# Patient Record
Sex: Male | Born: 1985
Health system: Southern US, Community
[De-identification: ages and names within clinical notes are randomized; demographics above are authoritative.]

## PROBLEM LIST (undated history)

## (undated) DIAGNOSIS — F419 Anxiety disorder, unspecified: Secondary | ICD-10-CM

## (undated) DIAGNOSIS — F1910 Other psychoactive substance abuse, uncomplicated: Secondary | ICD-10-CM

## (undated) DIAGNOSIS — R51 Headache: Secondary | ICD-10-CM

## (undated) DIAGNOSIS — F319 Bipolar disorder, unspecified: Secondary | ICD-10-CM

## (undated) DIAGNOSIS — I10 Essential (primary) hypertension: Secondary | ICD-10-CM

---
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
## 2005-01-31 ENCOUNTER — Emergency Department (HOSPITAL_COMMUNITY): Admission: AD | Admit: 2005-01-31 | Discharge: 2005-01-31 | Payer: Self-pay | Admitting: Emergency Medicine

## 2005-02-28 ENCOUNTER — Ambulatory Visit: Payer: Self-pay | Admitting: Psychiatry

## 2005-02-28 ENCOUNTER — Inpatient Hospital Stay (HOSPITAL_COMMUNITY): Admission: RE | Admit: 2005-02-28 | Discharge: 2005-03-03 | Payer: Self-pay | Admitting: Psychiatry

## 2009-04-05 ENCOUNTER — Emergency Department (HOSPITAL_COMMUNITY): Admission: EM | Admit: 2009-04-05 | Discharge: 2009-04-05 | Payer: Self-pay | Admitting: Emergency Medicine

## 2010-04-07 ENCOUNTER — Observation Stay (HOSPITAL_COMMUNITY): Admission: EM | Admit: 2010-04-07 | Discharge: 2010-04-08 | Payer: Self-pay | Admitting: Emergency Medicine

## 2010-04-29 ENCOUNTER — Emergency Department (HOSPITAL_COMMUNITY): Admission: EM | Admit: 2010-04-29 | Discharge: 2010-04-30 | Payer: Self-pay | Admitting: Emergency Medicine

## 2010-11-05 LAB — COMPREHENSIVE METABOLIC PANEL
AST: 24 U/L (ref 0–37)
BUN: 15 mg/dL (ref 6–23)
Chloride: 103 mEq/L (ref 96–112)
Creatinine, Ser: 1.14 mg/dL (ref 0.4–1.5)
GFR calc non Af Amer: >60 mL/min (ref >60)
Glucose, Bld: 138 mg/dL — ABNORMAL HIGH (ref 70–99)
Sodium: 137 mEq/L (ref 135–145)
Total Protein: 7.6 g/dL (ref 6.0–8.3)

## 2010-11-05 LAB — URINALYSIS, ROUTINE W REFLEX MICROSCOPIC
Bilirubin Urine: NEGATIVE
Nitrite: NEGATIVE
Protein, ur: NEGATIVE mg/dL
Specific Gravity, Urine: 1.03 (ref 1.005–1.030)
Urobilinogen, UA: 1 mg/dL (ref 0.0–1.0)

## 2010-11-05 LAB — DIFFERENTIAL
Basophils Absolute: 0.1 10*3/uL (ref 0.0–0.1)
Eosinophils Relative: 2 % (ref 0–5)
Lymphocytes Relative: 19 % (ref 12–46)
Lymphs Abs: 1.9 10*3/uL (ref 0.7–4.0)
Monocytes Absolute: 0.5 10*3/uL (ref 0.1–1.0)
Monocytes Relative: 5 % (ref 3–12)
Neutro Abs: 7.1 10*3/uL (ref 1.7–7.7)
Neutrophils Relative %: 73 % (ref 43–77)

## 2010-11-05 LAB — SALICYLATE LEVEL: Salicylate Lvl: <4 mg/dL (ref 2.8–20.0)

## 2010-11-05 LAB — CBC: WBC: 9.7 10*3/uL (ref 4.0–10.5)

## 2010-11-05 LAB — RAPID URINE DRUG SCREEN, HOSP PERFORMED
Amphetamines: NOT DETECTED
Barbiturates: NOT DETECTED

## 2010-11-05 LAB — ACETAMINOPHEN LEVEL: Acetaminophen (Tylenol), Serum: <10 ug/mL — ABNORMAL LOW (ref 10–30)

## 2010-11-06 LAB — CBC
HCT: 41.7 % (ref 39.0–52.0)
Hemoglobin: 13.9 g/dL (ref 13.0–17.0)
MCHC: 33.3 g/dL (ref 30.0–36.0)
MCV: 87.5 fL (ref 78.0–100.0)
MCV: 89.5 fL (ref 78.0–100.0)
Platelets: 337 10*3/uL (ref 150–400)
Platelets: 379 10*3/uL (ref 150–400)
Platelets: 382 10*3/uL (ref 150–400)
RBC: 4.57 MIL/uL (ref 4.22–5.81)
RBC: 4.66 MIL/uL (ref 4.22–5.81)
RDW: 13.6 % (ref 11.5–15.5)
WBC: 10.5 10*3/uL (ref 4.0–10.5)
WBC: 9.1 10*3/uL (ref 4.0–10.5)

## 2010-11-06 LAB — DIFFERENTIAL
Basophils Absolute: 0 10*3/uL (ref 0.0–0.1)
Basophils Absolute: 0 10*3/uL (ref 0.0–0.1)
Basophils Relative: 0 % (ref 0–1)
Eosinophils Absolute: 0.3 10*3/uL (ref 0.0–0.7)
Eosinophils Relative: 2 % (ref 0–5)
Eosinophils Relative: 3 % (ref 0–5)
Lymphocytes Relative: 20 % (ref 12–46)
Lymphocytes Relative: 22 % (ref 12–46)
Lymphs Abs: 2 10*3/uL (ref 0.7–4.0)
Lymphs Abs: 2.5 10*3/uL (ref 0.7–4.0)
Lymphs Abs: 2.8 10*3/uL (ref 0.7–4.0)
Monocytes Absolute: 1.2 10*3/uL — ABNORMAL HIGH (ref 0.1–1.0)
Monocytes Absolute: 1.4 10*3/uL — ABNORMAL HIGH (ref 0.1–1.0)
Monocytes Relative: 10 % (ref 3–12)
Neutro Abs: 6.3 10*3/uL (ref 1.7–7.7)
Neutro Abs: 8.5 10*3/uL — ABNORMAL HIGH (ref 1.7–7.7)
Neutrophils Relative %: 60 % (ref 43–77)
Neutrophils Relative %: 61 % (ref 43–77)

## 2010-11-06 LAB — RAPID URINE DRUG SCREEN, HOSP PERFORMED
Amphetamines: NOT DETECTED
Benzodiazepines: NOT DETECTED
Cocaine: POSITIVE — AB
Tetrahydrocannabinol: NOT DETECTED

## 2010-11-06 LAB — POCT I-STAT, CHEM 8
BUN: 20 mg/dL (ref 6–23)
Chloride: 108 mEq/L (ref 96–112)
HCT: 42 % (ref 39.0–52.0)
Hemoglobin: 14.3 g/dL (ref 13.0–17.0)
Sodium: 139 mEq/L (ref 135–145)
TCO2: 23 mmol/L (ref 0–100)

## 2010-11-06 LAB — URINALYSIS, ROUTINE W REFLEX MICROSCOPIC: Bilirubin Urine: NEGATIVE

## 2010-11-06 LAB — BASIC METABOLIC PANEL
BUN: 17 mg/dL (ref 6–23)
CO2: 19 mEq/L (ref 19–32)
GFR calc non Af Amer: >60 mL/min (ref >60)
Potassium: 3.6 mEq/L (ref 3.5–5.1)

## 2010-11-06 LAB — LIPASE, BLOOD: Lipase: 47 U/L (ref 11–59)

## 2010-11-29 LAB — COMPREHENSIVE METABOLIC PANEL
ALT: 21 U/L (ref 0–53)
AST: 25 U/L (ref 0–37)
Albumin: 4.4 g/dL (ref 3.5–5.2)
Alkaline Phosphatase: 58 U/L (ref 39–117)
BUN: 17 mg/dL (ref 6–23)
Chloride: 104 mEq/L (ref 96–112)
GFR calc Af Amer: >60 mL/min (ref >60)
Potassium: 3.9 mEq/L (ref 3.5–5.1)
Sodium: 137 mEq/L (ref 135–145)
Total Bilirubin: 0.4 mg/dL (ref 0.3–1.2)
Total Protein: 6.8 g/dL (ref 6.0–8.3)

## 2010-11-29 LAB — URINALYSIS, ROUTINE W REFLEX MICROSCOPIC
Glucose, UA: NEGATIVE mg/dL
Ketones, ur: 15 mg/dL — AB
Protein, ur: 30 mg/dL — AB
Specific Gravity, Urine: 1.029 (ref 1.005–1.030)

## 2010-11-29 LAB — DIFFERENTIAL
Basophils Absolute: 0 10*3/uL (ref 0.0–0.1)
Basophils Relative: 0 % (ref 0–1)
Eosinophils Absolute: 0 10*3/uL (ref 0.0–0.7)
Eosinophils Relative: 0 % (ref 0–5)
Monocytes Absolute: 0.8 10*3/uL (ref 0.1–1.0)
Monocytes Relative: 5 % (ref 3–12)

## 2010-11-29 LAB — CBC
HCT: 42.3 % (ref 39.0–52.0)
Platelets: 281 10*3/uL (ref 150–400)
WBC: 17.1 10*3/uL — ABNORMAL HIGH (ref 4.0–10.5)

## 2011-01-08 NOTE — Discharge Summary (Signed)
NAMEMarland [name]  [name], [name]:  [id number]   MEDICAL RECORD NO.:  [id number]          PATIENT TYPE:  IPS   LOCATION:  0306                          FACILITY:  [facility]   PHYSICIAN:  Jeanice Lim, M.D. DATE OF BIRTH:  1986-08-22   DATE OF ADMISSION:  02/28/2005  DATE OF DISCHARGE:  03/03/2005                                 DISCHARGE SUMMARY   IDENTIFYING DATA:  This is a 25 year old single Caucasian male voluntarily  admitted.  Presenting with a history of alcohol abuse.  Drinking daily,  having blackouts.  Six years worth of heavy drinking.  Using crack cocaine.  Drinking a fifth a day.  Also using marijuana and crack cocaine daily.  Depressed.  Had hit himself in the head with a liquor bottle, reporting some  suicidal thoughts as well as inability to stop using.   MEDICATIONS:  Valtrex.   ALLERGIES:  No known drug allergies.   PHYSICAL EXAMINATION:  Physical and neurologic exam essentially within  normal limits.   LABORATORY DATA:  Routine admission labs within normal limits.   MENTAL STATUS EXAM:  Alert, cooperative.  Poor eye contact.  Casually  dressed.  Speech clear.  Mood depressed and irritable.  Affect mildly  irritable with some inappropriate laughing.  Thought processes positive  paranoia, endorsing positive auditory hallucinations.  Cognitively, grossly  intact.  Judgment and insight fair with poor impulse control.   ADMISSION DIAGNOSES:  AXIS I:  Mood disorder with polysubstance abuse with  depression.  Rule out bipolar disorder.  AXIS II:  Deferred.  AXIS III:  None.  AXIS IV:  Moderate (stressors with educational problems, problems related to  legal system and psychosocial issues).  AXIS V:  35/60.   HOSPITAL COURSE:  The patient was admitted and ordered routine p.r.n.  medications and underwent further monitoring.  Was encouraged to participate  in individual, group and milieu therapy.  The patient was placed on detox  protocol, given  Symmetrel, complaining of withdrawal symptoms.  Reported  some difficulty sleeping but then reported a positive response to crisis  intervention, detox and stabilization.   CONDITION ON DISCHARGE:  Discharged in improved condition with no acute  withdrawal symptoms.  No suicidal or homicidal ideation.  Mood was euthymic.  Affect bright.  Thought processes goal directed.  Thought content negative  for dangerous ideation.  The patient appeared motivated to remain abstinent.  Was given medication education.   DISCHARGE MEDICATIONS:  1.  Depakote 250 mg, 1 q.a.m. and 2 at 8 p.m.  2.  Librium 25 mg, 1 at 8 p.m. for two days and then stop.  3.  Symmetrel 100 mg b.i.d.  4.  Risperdal 0.5 mg, 1-1/2 at 8 p.m.  5.  Lunesta 3 mg, 1 q.h.s. p.r.n. insomnia.  6.  Trazodone 100 mg at 8 p.m.   FOLLOW UP:  The patient is to be discharged to follow up with [facility] in  [place].   DISCHARGE DIAGNOSES:  AXIS I:  Mood disorder with polysubstance abuse with  depression.  Rule out bipolar disorder.  AXIS II:  Deferred.  AXIS III:  None.  AXIS IV:  Moderate (stressors with educational problems, problems related to  legal system and psychosocial issues).  AXIS V:  GAF on discharge 55-60.      Jeanice Lim, M.D.  Electronically Signed     JEM/MEDQ  D:  04/15/2005  T:  04/15/2005  Job:  [id number]

## 2011-01-08 NOTE — H&P (Signed)
NAMEMarland Kitchen  Derrick [name], Derrick [name] NO.:  [id number]   MEDICAL RECORD NO.:  [id number]          PATIENT TYPE:  IPS   LOCATION:  0306                          FACILITY:  [facility]   PHYSICIAN:  Jeanice Lim, M.D. DATE OF BIRTH:  02/19/1986   DATE OF ADMISSION:  02/28/2005  DATE OF DISCHARGE:                         PSYCHIATRIC ADMISSION ASSESSMENT   IDENTIFYING INFORMATION:  This is a 25 year old single white male,  voluntarily admitted on February 28, 2005.   HISTORY OF PRESENT ILLNESS:  The patient presents with a history of alcohol  abuse.  The patient states he has been drinking all kinds of alcohol.  His  last drink was 2 days ago.  He drinks in the morning.  He reports blackouts.  He states he will either drink a fifth of liquor, a 12 pack of beer or liter  of wine daily.  He also has been using crack cocaine for the past 6 years,  smoking marijuana.  He has been drinking for approximately 3 years.  He  feels depressed.  His behavior changes.  He states he gets mean.  He has a  history of hitting himself in the head with a liquor bottle.  He breaks  objects.  He hits walls with his fists.  He states when he plays pool with  his friends when someone loses they have to cut themselves.  He reports  decreased sleep.  He has initial and mid insomnia.  Reports mood swings.  He  has lost 20 pounds and is experiencing paranoid ideation.   PAST PSYCHIATRIC HISTORY:  First admission to [facility], no  history of a suicide attempt.  He was in [facility] 1 month ago.   SOCIAL HISTORY:  He is a 25 year old single white male.  He has no children.  He lives with his grandparents.  He has completed 9th grade.  He states he  is on unsupervised probation.  He has had legal problems with breaking and  entering, misdemeanor with marijuana and states he is currently in a  relationship with a 26 year old male.   FAMILY HISTORY:  Grandfather with alcohol issues.   ALCOHOL DRUG  HISTORY:  The patient smokes, drinking habits are described as  above, as well as his drug habits.   PAST MEDICAL HISTORY:  Primary care Lauriann Milillo:  The patient reports none.  Medical problems are none.   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   DRUG ALLERGIES:  No known allergies.   PHYSICAL EXAMINATION:  Physical examination was performed.  Review of  systems:  The patient denies any chest pain, shortness of breath, reports a  20 pound weight loss, problems with insomnia and mood swings.  Strong  history of alcohol and drug use.  Temperature is 98.1, 98 heart rate, 18  respirations, blood pressure is 136/78.  Six feet tall, 174 pounds.  He is a  well-nourished, well-developed male, no acute distress.  Negative  lymphadenopathy.  CHEST:  Clear.  HEART:  Regular rate and rhythm.  ABDOMEN:  Soft and flat abdomen, nontender.  EXTREMITIES:  The patient moves  all extremities, no clubbing or edema.  SKIN:  Is warm and dry.  NEUROLOGICAL FINDINGS:  Intact and nonfocal.   LABORATORY DATA:  CBC is within normal limits.  CMET is within normal  limits.  Alcohol level less than 5.  Urine drug screen is pending.   MENTAL STATUS EXAM:  He is an alert, cooperative male, poor eye contact.  He  is casually dressed.  Speech is clear.  The patient feels depressed and  irritable.  The patient also is expressing some mild irritability and  inappropriate laughing.  Thought processes positive paranoia, endorsing  positive auditory hallucinations with his substances.  Cognitive function  intact.  Memory is good.  Judgment and insight is fair, poor impulse  control.   ADMISSION DIAGNOSES:  AXIS I:  Mood disorder, polysubstance abuse, rule out  dependence, rule out bipolar disorder with substance abuse.  AXIS II:  Deferred.  AXIS III:  None.  AXIS IV:  Problems with education, legal system and crime, other  psychosocial problems.  AXIS V:  Current is 35, past year 64.   PLAN:   Plan is to detox patient, work on relapse prevention.  We will  monitor withdrawal symptoms, push fluids.  We will have Depakote and  Risperdal for mood stabilization and psychotic symptoms, have Symmetrel for  drug cravings.  Case manager to look at any potential rehab programs.  The  patient is to follow up with AA and NA meetings.   TENTATIVE LENGTH OF CARE:  4-5 days.       JO/MEDQ  D:  03/02/2005  T:  03/02/2005  Job:  [id number]

## 2011-04-19 ENCOUNTER — Ambulatory Visit (INDEPENDENT_AMBULATORY_CARE_PROVIDER_SITE_OTHER): Payer: Self-pay

## 2011-04-19 ENCOUNTER — Inpatient Hospital Stay (INDEPENDENT_AMBULATORY_CARE_PROVIDER_SITE_OTHER)
Admission: RE | Admit: 2011-04-19 | Discharge: 2011-04-19 | Disposition: A | Discharge status: Home / Self Care | Payer: Self-pay | Source: Ambulatory Visit | Attending: Family Medicine | Admitting: Family Medicine

## 2011-04-19 DIAGNOSIS — R1032 Left lower quadrant pain: Secondary | ICD-10-CM

## 2011-04-19 LAB — POCT URINALYSIS DIP (DEVICE)
Hgb urine dipstick: NEGATIVE
Leukocytes, UA: NEGATIVE
Nitrite: NEGATIVE
Protein, ur: NEGATIVE mg/dL
pH: 5.5 (ref 5.0–8.0)

## 2013-01-03 ENCOUNTER — Emergency Department (HOSPITAL_COMMUNITY)
Admission: EM | Admit: 2013-01-03 | Discharge: 2013-01-03 | Disposition: A | Discharge status: Other Acute Inpatient Hospital | Payer: Self-pay | Attending: Emergency Medicine | Admitting: Emergency Medicine

## 2013-01-03 ENCOUNTER — Encounter (HOSPITAL_COMMUNITY): Payer: Self-pay | Admitting: Emergency Medicine

## 2013-01-03 ENCOUNTER — Emergency Department (HOSPITAL_COMMUNITY): Payer: Self-pay

## 2013-01-03 ENCOUNTER — Inpatient Hospital Stay (HOSPITAL_COMMUNITY)
Admission: AD | Admit: 2013-01-03 | Discharge: 2013-01-08 | DRG: 885 | Disposition: A | Discharge status: Home / Self Care | Payer: Federal, State, Local not specified - Other | Source: Intra-hospital | Attending: Psychiatry | Admitting: Psychiatry

## 2013-01-03 ENCOUNTER — Encounter (HOSPITAL_COMMUNITY): Payer: Self-pay | Admitting: *Deleted

## 2013-01-03 DIAGNOSIS — Z79899 Other long term (current) drug therapy: Secondary | ICD-10-CM

## 2013-01-03 DIAGNOSIS — Z7289 Other problems related to lifestyle: Secondary | ICD-10-CM

## 2013-01-03 DIAGNOSIS — F639 Impulse disorder, unspecified: Secondary | ICD-10-CM | POA: Diagnosis present

## 2013-01-03 DIAGNOSIS — Z0289 Encounter for other administrative examinations: Secondary | ICD-10-CM | POA: Insufficient documentation

## 2013-01-03 DIAGNOSIS — F489 Nonpsychotic mental disorder, unspecified: Secondary | ICD-10-CM

## 2013-01-03 DIAGNOSIS — F319 Bipolar disorder, unspecified: Secondary | ICD-10-CM | POA: Insufficient documentation

## 2013-01-03 DIAGNOSIS — R45851 Suicidal ideations: Secondary | ICD-10-CM

## 2013-01-03 DIAGNOSIS — F411 Generalized anxiety disorder: Secondary | ICD-10-CM | POA: Insufficient documentation

## 2013-01-03 DIAGNOSIS — S1093XA Contusion of unspecified part of neck, initial encounter: Secondary | ICD-10-CM | POA: Insufficient documentation

## 2013-01-03 DIAGNOSIS — F419 Anxiety disorder, unspecified: Secondary | ICD-10-CM

## 2013-01-03 DIAGNOSIS — S0083XA Contusion of other part of head, initial encounter: Secondary | ICD-10-CM

## 2013-01-03 DIAGNOSIS — S0003XA Contusion of scalp, initial encounter: Secondary | ICD-10-CM | POA: Insufficient documentation

## 2013-01-03 DIAGNOSIS — F39 Unspecified mood [affective] disorder: Principal | ICD-10-CM | POA: Diagnosis present

## 2013-01-03 DIAGNOSIS — X838XXA Intentional self-harm by other specified means, initial encounter: Secondary | ICD-10-CM | POA: Insufficient documentation

## 2013-01-03 DIAGNOSIS — F1410 Cocaine abuse, uncomplicated: Secondary | ICD-10-CM | POA: Insufficient documentation

## 2013-01-03 DIAGNOSIS — H1130 Conjunctival hemorrhage, unspecified eye: Secondary | ICD-10-CM | POA: Insufficient documentation

## 2013-01-03 DIAGNOSIS — S0010XA Contusion of unspecified eyelid and periocular area, initial encounter: Secondary | ICD-10-CM | POA: Insufficient documentation

## 2013-01-03 HISTORY — DX: Bipolar disorder, unspecified: F31.9

## 2013-01-03 LAB — COMPREHENSIVE METABOLIC PANEL
AST: 13 U/L (ref 0–37)
BUN: 17 mg/dL (ref 6–23)
CO2: 25 mEq/L (ref 19–32)
Calcium: 9.6 mg/dL (ref 8.4–10.5)
Chloride: 102 mEq/L (ref 96–112)
Creatinine, Ser: 0.99 mg/dL (ref 0.50–1.35)
GFR calc non Af Amer: >90 mL/min (ref >90)
Total Bilirubin: 0.3 mg/dL (ref 0.3–1.2)

## 2013-01-03 LAB — RAPID URINE DRUG SCREEN, HOSP PERFORMED
Cocaine: POSITIVE — AB
Opiates: NOT DETECTED
Tetrahydrocannabinol: POSITIVE — AB

## 2013-01-03 LAB — ETHANOL: Alcohol, Ethyl (B): <11 mg/dL (ref 0–11)

## 2013-01-03 LAB — CBC
HCT: 42.8 % (ref 39.0–52.0)
MCH: 31.4 pg (ref 26.0–34.0)
MCV: 86.6 fL (ref 78.0–100.0)
Platelets: 344 10*3/uL (ref 150–400)
RBC: 4.94 MIL/uL (ref 4.22–5.81)
RDW: 12.7 % (ref 11.5–15.5)

## 2013-01-03 MED ORDER — HYDROXYZINE HCL 25 MG PO TABS: 25.0000 mg | ORAL_TABLET | ORAL | Status: DC | PRN

## 2013-01-03 MED ORDER — IBUPROFEN 600 MG PO TABS: 600.0000 mg | ORAL_TABLET | Freq: Three times a day (TID) | ORAL | Status: DC | PRN

## 2013-01-03 MED ORDER — HALOPERIDOL LACTATE 5 MG/ML IJ SOLN: 5.0000 mg | Freq: Four times a day (QID) | INTRAMUSCULAR | Status: DC | PRN

## 2013-01-03 MED ORDER — NICOTINE 21 MG/24HR TD PT24
21.0000 mg | MEDICATED_PATCH | Freq: Every day | TRANSDERMAL | Status: DC
  Administered 2013-01-04 – 2013-01-08 (×5): 21 mg via TRANSDERMAL
  Filled 2013-01-03 (×8): qty 1

## 2013-01-03 MED ORDER — ZOLPIDEM TARTRATE 5 MG PO TABS: 5.0000 mg | ORAL_TABLET | Freq: Every evening | ORAL | Status: DC | PRN

## 2013-01-03 MED ORDER — MAGNESIUM HYDROXIDE 400 MG/5ML PO SUSP: 30.0000 mL | Freq: Every day | ORAL | Status: DC | PRN

## 2013-01-03 MED ORDER — DIVALPROEX SODIUM ER 500 MG PO TB24
500.0000 mg | ORAL_TABLET | Freq: Two times a day (BID) | ORAL | Status: DC
  Administered 2013-01-03: 500 mg via ORAL
  Filled 2013-01-03: qty 1

## 2013-01-03 MED ORDER — ALUM & MAG HYDROXIDE-SIMETH 200-200-20 MG/5ML PO SUSP: 30.0000 mL | ORAL | Status: DC | PRN

## 2013-01-03 MED ORDER — FLUOXETINE HCL 20 MG PO TABS
20.0000 mg | ORAL_TABLET | Freq: Every day | ORAL | Status: DC
  Administered 2013-01-04 – 2013-01-05 (×2): 20 mg via ORAL
  Filled 2013-01-03 (×6): qty 1

## 2013-01-03 MED ORDER — ACETAMINOPHEN 325 MG PO TABS: 650.0000 mg | ORAL_TABLET | Freq: Four times a day (QID) | ORAL | Status: DC | PRN

## 2013-01-03 MED ORDER — HALOPERIDOL 5 MG PO TABS: 5.0000 mg | ORAL_TABLET | Freq: Four times a day (QID) | ORAL | Status: DC | PRN

## 2013-01-03 MED ORDER — LORAZEPAM 1 MG PO TABS: 1.0000 mg | ORAL_TABLET | Freq: Three times a day (TID) | ORAL | Status: DC | PRN

## 2013-01-03 MED ORDER — NICOTINE 21 MG/24HR TD PT24
21.0000 mg | MEDICATED_PATCH | Freq: Every day | TRANSDERMAL | Status: DC
  Administered 2013-01-03: 21 mg via TRANSDERMAL
  Filled 2013-01-03: qty 1

## 2013-01-03 MED ORDER — ACETAMINOPHEN 325 MG PO TABS: 650.0000 mg | ORAL_TABLET | ORAL | Status: DC | PRN

## 2013-01-03 MED ORDER — ONDANSETRON HCL 4 MG PO TABS: 4.0000 mg | ORAL_TABLET | Freq: Three times a day (TID) | ORAL | Status: DC | PRN

## 2013-01-03 MED ORDER — ALUM & MAG HYDROXIDE-SIMETH 200-200-20 MG/5ML PO SUSP
30.0000 mL | ORAL | Status: DC | PRN
  Administered 2013-01-05: 30 mL via ORAL

## 2013-01-03 NOTE — Consult Note (Signed)
Reason for Consult: Substance induced mood disorder, bipolar disorder by history, cocaine intoxication and cannabis abuse Referring Physician: Dr. Francee Gentile is an 27 y.o. male.  HPI: Patient was seen and chart reviewed. Patient stated that he was referred to the psychiatric evaluation by his probation officer because self-injurious behavior suicidal thoughts and depression and anxiety. Patient cannot contract for safety. Patient reports he has been under a lot of stress which includes taking care of his mom who is very sick in the process of dying in the hospital. His girlfriend recently gave birth to his child however refuse to allow him to see his child because he does not get along with her fianc's mother/Bansal. He is having anxiety, panic attack and having persistent thoughts of suicidal ideation including overdose, or hanging himself but too afraid to do it. He admits to using crack last night. He denies any homicidal ideation or hallucination. He reports whenever he gets to stress how he usually would self injured by punching himself in the face also smashing his head against the wall, or using a phone to hit his forehead. He injured himself last night by punching himself in the face. He is here requesting for psychiatric help. He has been taking Prozac which he has for many years, no doses change. Patient is a smoker and smoked one and half pack a day. Urine drug screen positive for cocaine and tetrahydrocannabinol.  Mental Status Examination: Patient has a mild will self-injurious lacerations on his forehead and a black eye. He is calm, cooperative and maintained fair contact. Patient has good mood and his affect was constricted. He has normal rate, rhythm, and volume of speech. His thought process is linear and goal directed. Patient has suicidal ideations and vague plans but denies homicidal ideations, intentions or plans. Patient has no evidence of auditory or visual hallucinations,  delusions, and paranoia. Patient has poor insight judgment and impulse control.  Past Medical History  Diagnosis Date  . Bipolar 1 disorder     History reviewed. No pertinent past surgical history.  History reviewed. No pertinent family history.  Social History:  reports that he has been smoking Cigarettes.  He has been smoking about 1.00 pack per day. He does not have any smokeless tobacco history on file. He reports that  drinks alcohol. He reports that he uses illicit drugs (Marijuana and "Crack" cocaine).  Allergies: No Known Allergies  Medications: I have reviewed the patient's current medications.  Results for orders placed during the hospital encounter of 01/03/13 (from the past 48 hour(s))  CBC     Status: Abnormal   Collection Time    01/03/13  9:30 AM      Result Value Range   WBC 8.6  4.0 - 10.5 K/uL   RBC 4.94  4.22 - 5.81 MIL/uL   Hemoglobin 15.5  13.0 - 17.0 g/dL   HCT 16.1  09.6 - 04.5 %   MCV 86.6  78.0 - 100.0 fL   MCH 31.4  26.0 - 34.0 pg   MCHC 36.2 (*) 30.0 - 36.0 g/dL   RDW 40.9  81.1 - 91.4 %   Platelets 344  150 - 400 K/uL  COMPREHENSIVE METABOLIC PANEL     Status: Abnormal   Collection Time    01/03/13  9:30 AM      Result Value Range   Sodium 138  135 - 145 mEq/L   Potassium 3.5  3.5 - 5.1 mEq/L   Chloride 102  96 - 112 mEq/L   CO2 25  19 - 32 mEq/L   Glucose, Bld 107 (*) 70 - 99 mg/dL   BUN 17  6 - 23 mg/dL   Creatinine, Ser 4.09  0.50 - 1.35 mg/dL   Calcium 9.6  8.4 - 81.1 mg/dL   Total Protein 7.2  6.0 - 8.3 g/dL   Albumin 4.0  3.5 - 5.2 g/dL   AST 13  0 - 37 U/L   ALT 9  0 - 53 U/L   Alkaline Phosphatase 66  39 - 117 U/L   Total Bilirubin 0.3  0.3 - 1.2 mg/dL   GFR calc non Af Amer >90  >90 mL/min   GFR calc Af Amer >90  >90 mL/min   Comment:            The eGFR has been calculated     using the CKD EPI equation.     This calculation has not been     validated in all clinical     situations.     eGFR's persistently     <90 mL/min  signify     possible Chronic Kidney Disease.  ETHANOL     Status: None   Collection Time    01/03/13  9:30 AM      Result Value Range   Alcohol, Ethyl (B) <11  0 - 11 mg/dL   Comment:            LOWEST DETECTABLE LIMIT FOR     SERUM ALCOHOL IS 11 mg/dL     FOR MEDICAL PURPOSES ONLY  URINE RAPID DRUG SCREEN (HOSP PERFORMED)     Status: Abnormal   Collection Time    01/03/13  9:45 AM      Result Value Range   Opiates NONE DETECTED  NONE DETECTED   Cocaine POSITIVE (*) NONE DETECTED   Benzodiazepines NONE DETECTED  NONE DETECTED   Amphetamines NONE DETECTED  NONE DETECTED   Tetrahydrocannabinol POSITIVE (*) NONE DETECTED   Barbiturates NONE DETECTED  NONE DETECTED   Comment:            DRUG SCREEN FOR MEDICAL PURPOSES     ONLY.  IF CONFIRMATION IS NEEDED     FOR ANY PURPOSE, NOTIFY LAB     WITHIN 5 DAYS.                LOWEST DETECTABLE LIMITS     FOR URINE DRUG SCREEN     Drug Class       Cutoff (ng/mL)     Amphetamine      1000     Barbiturate      200     Benzodiazepine   200     Tricyclics       300     Opiates          300     Cocaine          300     THC              50    Ct Maxillofacial Wo Cm  01/03/2013   *RADIOLOGY REPORT*  Clinical Data: Soft injury.  Rule out orbital fracture.  Bruising about right eye.  CT MAXILLOFACIAL WITHOUT CONTRAST  Technique:  Multidetector CT imaging of the maxillofacial structures was performed. Multiplanar CT image reconstructions were also generated.  Comparison: None.  Findings: Soft tissue windows demonstrate right periorbital soft tissue swelling.  Normal appearance of the right  orbit and globe.  Bone windows demonstrate mucosal thickening of bilateral maxillary sinuses.  Suspicion of remote nasal bone fractures, nondisplaced to minimally displaced.  No overlying soft tissue swelling to suggest acuity.  Ethmoid air cell mucosal thickening.  No fluid in the mastoid air cells or paranasal sinuses.  Coronal reformats demonstrate intact  orbital floors.  Dental caries involving bilateral maxillary teeth, including on image 27/series 4.  IMPRESSION:  1.  Soft tissue swelling about the right orbit, without underlying fracture. 2.  Irregularity of bilateral nasal bones, likely related to remote trauma. 3.  Chronic sinusitis. 4.  Dental caries.   Original Report Authenticated By: Jeronimo Greaves, M.D.    Positive for anxiety, depression, illegal drug usage, sleep disturbance and Legal charges for breaking and entering and larsany, history of aggressive  behaviors Blood pressure 146/90, pulse 70, temperature 98.3 F (36.8 C), temperature source Oral, resp. rate 14, height 6' (1.829 m), weight 180 lb (81.647 kg), SpO2 100.00%.   Assessment/Plan: Cocaine intoxication Cannabis abuse Substance induced mood disorder Rule out bipolar disorder  Recommendation: Recommended acute psychiatric hospitalization for crisis stabilization and to obtain additional information from the probation officer and family members. No medication recommended at this time.  Belem Hintze,JANARDHAHA R. 01/03/2013, 12:39 PM

## 2013-01-03 NOTE — ED Provider Notes (Signed)
History     CSN: [id number]  Arrival date & time 01/03/13  0911   First MD Initiated Contact with Patient 01/03/13 [phone]      Chief Complaint  Patient presents with  . Medical Clearance    (Consider location/radiation/quality/duration/timing/severity/associated sxs/prior treatment) HPI  27 year old male with history of bipolar presents for evaluations of suicidal ideation. Patient reports he has been under a lot of stress lately. Stress include taking care of  his mom who is very sick in the process of dying in the hospital. His girlfriend recently gave birth to his child however refuse to allow him to see his child. He is having anxiety, panic attack and having persistent thoughts of suicidal ideation including overdose, or hanging himself but too afraid to do it. He admits to using crack last night. He denies any recent alcohol use. He denies any homicidal ideation or hallucination. He reports whenever he gets to stress how he usually would self injured by punching himself in the face also smashing his head against the wall, or using a phone to hit his forehead. He injured himself last night by punching himself in the face.  He is here requesting for psychiatric help. He has been taking Prozac which he has for many years, no doses change. Patient is a smoker and smoked one and half pack a day.  Past Medical History  Diagnosis Date  . Bipolar 1 disorder     History reviewed. No pertinent past surgical history.  History reviewed. No pertinent family history.  History  Substance Use Topics  . Smoking status: Current Every Day Smoker -- 1.00 packs/day    Types: Cigarettes  . Smokeless tobacco: Not on file  . Alcohol Use: No      Review of Systems  Constitutional:       A complete 10 system review of systems was obtained and all systems are negative except as noted in the HPI and PMH.    Allergies  Review of patient's allergies indicates no known allergies.  Home Medications   No current outpatient prescriptions on file.  There were no vitals taken for this visit.  Physical Exam  Nursing note and vitals reviewed. Constitutional: He appears well-developed and well-nourished. No distress.  Awake, alert, nontoxic appearance  HENT:  Head: Normocephalic.    Nose: no septal hematoma  Mouth: no malocclusion, or trismus  Eyes: EOM are normal. Pupils are equal, round, and reactive to light. Right eye exhibits no discharge. Left eye exhibits no discharge. Right conjunctiva is not injected. Right conjunctiva has a hemorrhage. Left conjunctiva is not injected. Left conjunctiva has no hemorrhage.  Slit lamp exam:      The right eye shows no hyphema.       The left eye shows no hyphema.  Subconjunctival hemorrhage to R eye.  No chemosis.  EOMI, PERRL  Racoon eyes R>L.  Neck: Normal range of motion. Neck supple.  Cardiovascular: Normal rate and regular rhythm.   Pulmonary/Chest: Effort normal. No respiratory distress. He exhibits no tenderness.  Abdominal: Soft. There is no tenderness. There is no rebound.  Musculoskeletal: He exhibits no tenderness.  ROM appears intact, no obvious focal weakness  Neurological: He is alert.  Skin: Skin is warm and dry. No rash noted.  Psychiatric: He has a normal mood and affect.    ED Course  Procedures (including critical care time)  9:37 AM Pt here for evaluation and management of SI.  Also has self injury habit, with evidence of  ecchymosis, and Racoon eyes to both eyes.  Will obtain maxillofacial, will perform medical clearance.  Pt is cooperative.    11:01 AM CT of maxillofacial shows evidence of soft tissue swelling to the right orbit however without underlying fractures. There is irregularity of the bilateral nasal bones likely related to remote trauma.   The patient is medically cleared, we'll move the psych,will consult ACT.  Labs Reviewed  CBC - Abnormal; Notable for the following:    MCHC 36.2 (*)    All other  components within normal limits  COMPREHENSIVE METABOLIC PANEL - Abnormal; Notable for the following:    Glucose, Bld 107 (*)    All other components within normal limits  URINE RAPID DRUG SCREEN (HOSP PERFORMED) - Abnormal; Notable for the following:    Cocaine POSITIVE (*)    Tetrahydrocannabinol POSITIVE (*)    All other components within normal limits  ETHANOL   Ct Maxillofacial Wo Cm  01/03/2013   *RADIOLOGY REPORT*  Clinical Data: Soft injury.  Rule out orbital fracture.  Bruising about right eye.  CT MAXILLOFACIAL WITHOUT CONTRAST  Technique:  Multidetector CT imaging of the maxillofacial structures was performed. Multiplanar CT image reconstructions were also generated.  Comparison: None.  Findings: Soft tissue windows demonstrate right periorbital soft tissue swelling.  Normal appearance of the right orbit and globe.  Bone windows demonstrate mucosal thickening of bilateral maxillary sinuses.  Suspicion of remote nasal bone fractures, nondisplaced to minimally displaced.  No overlying soft tissue swelling to suggest acuity.  Ethmoid air cell mucosal thickening.  No fluid in the mastoid air cells or paranasal sinuses.  Coronal reformats demonstrate intact orbital floors.  Dental caries involving bilateral maxillary teeth, including on image 27/series 4.  IMPRESSION:  1.  Soft tissue swelling about the right orbit, without underlying fracture. 2.  Irregularity of bilateral nasal bones, likely related to remote trauma. 3.  Chronic sinusitis. 4.  Dental caries.   Original Report Authenticated By: Jeronimo Greaves, M.D.     1. Self-injurious behavior   2. Suicidal ideation   3. Anxiety   4. Traumatic ecchymosis of face, initial encounter       MDM  BP 149/90  Pulse 79  Temp(Src) 98.4 F (36.9 C) (Oral)  Resp 16  Ht 6' (1.829 m)  Wt 180 lb (81.647 kg)  BMI 24.41 kg/m2  SpO2 98%  I have reviewed nursing notes and vital signs. I personally reviewed the imaging tests through PACS system   I reviewed available ER/hospitalization records thought the EMR         Fayrene Helper, [place] 01/03/13 1107

## 2013-01-03 NOTE — ED Notes (Addendum)
Pt states that he is having SI.  Went to [employer] on Monday.  States that "they didn't do anything for him".  Pt has a large black eye to his right eye.  States that he punched himself in the face.  States that when he has anxiety, he beats himself over the head with a phone.  Denies HI.  Plan is to hang himself or overdose.  Pt is on probation and has a ankle bracelet that needs to be charged for 2 hours each day.

## 2013-01-03 NOTE — Tx Team (Signed)
Initial Interdisciplinary Treatment Plan  PATIENT STRENGTHS: (choose at least two) Average or above average intelligence Communication skills General fund of knowledge Motivation for treatment/growth Supportive family/friends  PATIENT STRESSORS: Financial difficulties Legal issue Marital or family conflict Occupational concerns Substance abuse   PROBLEM LIST: Problem List/Patient Goals Date to be addressed Date deferred Reason deferred Estimated date of resolution  Substance abuse 01-03-13     Depression 01-03-13     Mood stabilization 01-03-13     Family conflict 01-03-13     Legal problems 01-03-13     Suicidal ideation 01-03-13                        DISCHARGE CRITERIA:  Ability to meet basic life and health needs Improved stabilization in mood, thinking, and/or behavior Motivation to continue treatment in a less acute level of care Need for constant or close observation no longer present Withdrawal symptoms are absent or subacute and managed without 24-hour nursing intervention  PRELIMINARY DISCHARGE PLAN: Attend aftercare/continuing care group Attend 12-step recovery group Return to previous living arrangement  PATIENT/FAMIILY INVOLVEMENT: This treatment plan has been presented to and reviewed with the patient, Derrick [name].  The patient and family have been given the opportunity to ask questions and make suggestions.  Cranford Mon 01/03/2013, 6:31 PM

## 2013-01-03 NOTE — ED Provider Notes (Signed)
Medical screening examination/treatment/procedure(s) were performed by non-physician practitioner and as supervising physician I was immediately available for consultation/collaboration. Devoria Albe, MD, Armando Gang   Ward Givens, MD 01/03/13 [phone]

## 2013-01-03 NOTE — ED Notes (Signed)
Report called to Sheila, RN.
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
## 2013-01-03 NOTE — Progress Notes (Signed)
Patient ID: Derrick [name], male   DOB: Jul 22, 1986, 27 y.o.   MRN: [id number] Derrick [name] is a 27 yo male w/history of bipolar disorder admitted for suicidal ideation and substance abuse.  Patient has a hx of self mutilation in which he cuts and hit himself.  Patient has a black right eye due to him hitting himself.  He also has multiple superficial cuts over his chest area.  He reports that he last cut on 01/02/13.  Patient had a plan to overdose, hang self, or sit in a garage with the car running.  He reports he remains suicidal, however, can contract for safety with staff.  Patient reports being under a lot of stress lately.  He takes care of his elderly mother who has alzheimers.  His fiance just had their baby and refuses to let him see the baby.  Patient had made an attempt to see the child yesterday and states he was served at [employer].  He is also on house arrest for possession of stolen goods and larceny.  He is currently wearing an ankle bracelet on right leg.  Patient report daily use of crack cocaine.  His last use was 01/02/13.  He denies any alcohol use.  His COWS was a 5.  Patient also reports that he has severe panic attacks in which he blacks out and cannot take vistaril for them.  He reports that vistaril makes "me violent."  Patient denies any HI or AVH.  He was oriented to unit and given nutrition and fluids.

## 2013-01-03 NOTE — Progress Notes (Signed)
Patient accepted to [facility] [address] to Dr. Jannifer Franklin. RN and EDP aware. Patient voluntary and will be transferred by security. CSW completed support paperwork.   Catha Gosselin, LCSWA  [phone] .01/03/2013 1546pm

## 2013-01-04 ENCOUNTER — Encounter (HOSPITAL_COMMUNITY): Payer: Self-pay | Admitting: Psychiatry

## 2013-01-04 DIAGNOSIS — F639 Impulse disorder, unspecified: Secondary | ICD-10-CM | POA: Diagnosis present

## 2013-01-04 DIAGNOSIS — F1920 Other psychoactive substance dependence, uncomplicated: Secondary | ICD-10-CM

## 2013-01-04 DIAGNOSIS — F39 Unspecified mood [affective] disorder: Principal | ICD-10-CM | POA: Diagnosis present

## 2013-01-04 MED ORDER — CHLORDIAZEPOXIDE HCL 25 MG PO CAPS
25.0000 mg | ORAL_CAPSULE | Freq: Three times a day (TID) | ORAL | Status: DC | PRN
  Administered 2013-01-04 – 2013-01-08 (×9): 25 mg via ORAL
  Filled 2013-01-04 (×9): qty 1

## 2013-01-04 MED ORDER — GABAPENTIN 100 MG PO CAPS
100.0000 mg | ORAL_CAPSULE | Freq: Three times a day (TID) | ORAL | Status: DC
  Administered 2013-01-04 – 2013-01-05 (×4): 100 mg via ORAL
  Filled 2013-01-04 (×6): qty 1

## 2013-01-04 MED ORDER — CARBAMAZEPINE ER 200 MG PO CP12
200.0000 mg | ORAL_CAPSULE | Freq: Two times a day (BID) | ORAL | Status: DC
  Administered 2013-01-04 – 2013-01-08 (×8): 200 mg via ORAL
  Filled 2013-01-04 (×4): qty 1
  Filled 2013-01-04: qty 56
  Filled 2013-01-04 (×4): qty 1
  Filled 2013-01-04: qty 56
  Filled 2013-01-04 (×2): qty 1

## 2013-01-04 MED ORDER — DIPHENHYDRAMINE HCL 25 MG PO CAPS: 50.0000 mg | ORAL_CAPSULE | Freq: Every evening | ORAL | Status: DC | PRN

## 2013-01-04 MED ORDER — IBUPROFEN 200 MG PO TABS
400.0000 mg | ORAL_TABLET | ORAL | Status: DC | PRN
  Administered 2013-01-04 – 2013-01-07 (×3): 400 mg via ORAL
  Filled 2013-01-04 (×3): qty 2

## 2013-01-04 NOTE — Progress Notes (Signed)
Patient ID: Derrick [name], male   DOB: [birth year], 26 y.o.   MRN: [id number]   D: Pt appeared extremely anxious and in fact informed the nurse of this fact. Stated he just wants to be "normal".  Pt stated his temper "flips like a switch" and that he has a "mean nature". Also states he has panic attacks, but can't and won't take vistaril, topamax, or remeron. Pt stated that his mother used to give him neurontin, "it helped and librium helps".   A:  Support and encouragement was offered. 15 min checks continued for safety.  R: Pt remains safe.

## 2013-01-04 NOTE — Progress Notes (Signed)
Recreation Therapy Notes  Date: 05.15.2014 Time: 3:00pm Location: [address] Dayroom      Group Topic/Focus: Leisure Education  Participation Level: Did not attend  Bonni Neuser L Micah Galeno, LRT/CTRS  Ciarah Peace L 01/04/2013 4:25 PM 
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
## 2013-01-04 NOTE — Progress Notes (Signed)
Patient did attend the evening karaoke group.  

## 2013-01-04 NOTE — [facility] Group Notes (Signed)
[facility] LCSW Aftercare Discharge Planning Group Note   01/04/2013 8:45 AM  Participation Quality:  Did Not Attend      Derrick [name]
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
## 2013-01-04 NOTE — [facility] Suicide Risk Assessment (Signed)
Suicide Risk Assessment  Admission Assessment     Nursing information obtained from:  Patient Demographic factors:  Male;Caucasian;Low socioeconomic status;Unemployed Current Mental Status:  Suicidal ideation indicated by patient;Suicide plan;Plan includes specific time, place, or method;Self-harm behaviors;Belief that plan would result in death Loss Factors:  Loss of significant relationship;Legal issues;Financial problems / change in socioeconomic status Historical Factors:  Impulsivity Risk Reduction Factors:  Responsible for children under 53 years of age;Living with another person, especially a relative  CLINICAL FACTORS:   Severe Anxiety and/or Agitation Bipolar Disorder:   Mixed State Alcohol/Substance Abuse/Dependencies  COGNITIVE FEATURES THAT CONTRIBUTE TO RISK:  Closed-mindedness Polarized thinking Thought constriction (tunnel vision)    SUICIDE RISK:   Moderate:  Frequent suicidal ideation with limited intensity, and duration, some specificity in terms of plans, no associated intent, good self-control, limited dysphoria/symptomatology, some risk factors present, and identifiable protective factors, including available and accessible social support.  PLAN OF CARE: Supportive approach/coping skills/relapse prevention                               Optimize treatment with medications: Equetro/Neurontin/Prozac  I certify that inpatient services furnished can reasonably be expected to improve the patient's condition.  Anthony Tamburo A 01/04/2013, 6:24 PM

## 2013-01-04 NOTE — Progress Notes (Signed)
Patient ID: Derrick [name], male   DOB: 1986-03-23, 27 y.o.   MRN: [id number] He has been up and to parts of the groups interacting with peers and staff. Has c/o headache and received medication for pain. Self inventory: depression 9, hopelessness 10, withdrawals of agitation, SI on and off contracts for safety.

## 2013-01-05 DIAGNOSIS — F639 Impulse disorder, unspecified: Secondary | ICD-10-CM

## 2013-01-05 DIAGNOSIS — F39 Unspecified mood [affective] disorder: Principal | ICD-10-CM

## 2013-01-05 DIAGNOSIS — F4310 Post-traumatic stress disorder, unspecified: Secondary | ICD-10-CM

## 2013-01-05 MED ORDER — GABAPENTIN 100 MG PO CAPS
200.0000 mg | ORAL_CAPSULE | Freq: Three times a day (TID) | ORAL | Status: DC
  Administered 2013-01-05 – 2013-01-06 (×3): 200 mg via ORAL
  Filled 2013-01-05 (×8): qty 2

## 2013-01-05 MED ORDER — FLUOXETINE HCL 20 MG PO CAPS
20.0000 mg | ORAL_CAPSULE | Freq: Every day | ORAL | Status: DC
  Administered 2013-01-06 – 2013-01-08 (×3): 20 mg via ORAL
  Filled 2013-01-05 (×2): qty 1
  Filled 2013-01-05: qty 14
  Filled 2013-01-05 (×2): qty 1

## 2013-01-05 NOTE — Progress Notes (Signed)
D patient slept fair lasst nite, appetite is improving, en ergy level is low and ability to pay attention is poor, depressed 8/10 and hopeless 8/10, WD s/s includes agitation and headaches, states he is SI off and on but contracts for safety, taking meds as ordered by MD, is pleasant and easy to talk with, no complaints voiced this morning. Attending meals in the DR and attending group. A q84min safety checks continue and support offered, encouraged to continue attending group and to participate. R patient remains safe on the unit

## 2013-01-05 NOTE — [facility] Group Notes (Signed)
[facility] LCSW Group Therapy  01/05/2013 1:15 PM  Type of Therapy:  Group Therapy  Participation Level:  Active  Participation Quality:  Monopolizing  Affect:  Anxious and Excited  Cognitive:  Alert and Oriented  Insight:  Limited  Engagement in Therapy:  Developing yet limited  Modes of Intervention:  Clarification, Education, Reality Testing and Support  Summary of Progress/Problems: Patient's were invited to share feelings about relapse and group session also included an educational portion on Post Acute Withdrawal Syndrome (PAWS).  Derrick [name] shared how he uses substances to deal with his constant paranoia and fear which in effect heightens his paranoia and fear.  Derrick [name] also shared about how he smokes crack with entire family including dying mother, his father and also his brother.    [name]
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
## 2013-01-05 NOTE — Progress Notes (Signed)
Pt observed resting in bed with eyes closed. RR WNL, even and unlabored. Level III obs in place and pt safe. Lawrence Marseilles

## 2013-01-05 NOTE — [facility] Group Notes (Signed)
[facility] LCSW Aftercare Discharge Planning Group Note   01/05/2013 8:45 AM  Participation Quality:  Adequate  Mood/Affect:  Anxious  Depression Rating:  10  Anxiety Rating:  30  Thoughts of Suicide:  No Will you contract for safety?   NA  Current AVH:  No  Plan for Discharge/Comments:  Patient would like referral to inpatient treatment program then referral to halfway house  Transportation Means: Family or treatment center  Supports: Engineer, drilling, grandmother and fiancee  Derrick [name], Derrick [name]
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
## 2013-01-05 NOTE — Tx Team (Signed)
Interdisciplinary Treatment Plan Update (Adult)  Date: 01/05/13   Time Reviewed: 10:00 AM   Progress in Treatment:  Attending groups: Yes  Participating in groups: Yes  Taking medication as prescribed: Yes  Tolerating medication: Yes  Family/Significant othe contact made: Not as yet Patient understands diagnosis: Yes  Discussing patient identified problems/goals with staff: Yes  Medical problems stabilized or resolved: Yes  Denies suicidal/homicidal ideation: Yes  Issues/concerns per patient self-inventory: Yes  Other:   New problem(s) identified: N/A   Discharge Plan or Barriers: Pt will be referred to [facility], it will need to be determined if he is candidate due to house arrest  Reason for Continuation of Hospitalization:  Medication Stabilization, will be making changes today  Depression  Anxiety  Withdrawal   Comments: N/A   Estimated length of stay: 3-5   For review of initial/current patient goals, please see plan of care.   Attendees:  Patient:    Family:    Physician: Dr. Dub Mikes  01/05/13 10:00 AM   Nursing: Alease Frame, RN  01/05/13 10:00 AM   Clinical Social Worker: Prince Rome, LCSWA  01/05/13 10:00 AM   Other: Harold Barban, RN  01/05/13 10:00 AM   Other:  01/05/13 10:00 AM   Other: Liliane Bade, BSW (Transitional Care Manager)  01/05/13 10:00 AM   Other:    Other:    Other:    Other:    Other:    Other:    Other:     Scribe for Treatment Team:   Carney Bern, LCSWA 01/05/13  10:00 AM

## 2013-01-06 DIAGNOSIS — F909 Attention-deficit hyperactivity disorder, unspecified type: Secondary | ICD-10-CM

## 2013-01-06 MED ORDER — GABAPENTIN 300 MG PO CAPS
300.0000 mg | ORAL_CAPSULE | Freq: Three times a day (TID) | ORAL | Status: DC
  Administered 2013-01-06 – 2013-01-08 (×6): 300 mg via ORAL
  Filled 2013-01-06 (×6): qty 1
  Filled 2013-01-06: qty 42
  Filled 2013-01-06: qty 1
  Filled 2013-01-06 (×2): qty 42
  Filled 2013-01-06 (×2): qty 1
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
## 2013-01-06 NOTE — Progress Notes (Signed)
Date: 01/06/2013  Time: 4:13 PM  Group Topic/Focus:  Healthy Communication: The focus of this group is to discuss communication, barriers to communication, as well as healthy ways to communicate with others.  Participation Level: Active  Participation Quality: Appropriate, Sharing and Supportive  Affect: Appropriate  Cognitive: Appropriate  Insight: Appropriate  Engagement in Group: Engaged and Supportive  Modes of Intervention: Discussion, Education and Support  Additional Comments: Pt was appropriate and participated.  Derrick [name] M  01/06/2013, 4:13 PM

## 2013-01-06 NOTE — Progress Notes (Signed)
Psychoeducational Group Note  Date:  01/06/2013 Time:  1300 Group Topic/Focus:  Identifying Needs:   The focus of this group is to help patients identify their personal needs that have been historically problematic and identify healthy behaviors to address their needs.  Participation Level:  active Participation Quality: good Affect: flat Cognitive: good  Insight:  good  Engagement in Group: engaged  Additional Comments: Pt was engaged in group, asked appropriate questions, shared personal life experience with the group and demonstrates willingness to process and understand her problems. PDuke RN [facility]
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
## 2013-01-06 NOTE — Progress Notes (Signed)
Patient ID: Derrick [name], male   DOB: Mar 08, 1986, 27 y.o.   MRN: [id number]  D: Pt denies SI/HI/AVH. Pt is pleasant and cooperative. Pt states he is "better, I can't hold a job because I'm a violent person". Pt states he wants to get right for is daughter. Pt continued to talk about doing right   A: Pt was offered support and encouragement. Pt was given scheduled medications. Pt was encourage to attend groups. Q 15 minute checks were done for safety.   R:Pt attends groups and interacts well with peers and staff. Pt is taking medication. Pt has no complaints at this time.Pt receptive to treatment and safety maintained on unit.

## 2013-01-06 NOTE — Progress Notes (Signed)
Psychoeducational Group Note  Date:  01/06/2013 Time:  0945 am  Group Topic/Focus:  Identifying Needs:   The focus of this group is to help patients identify their personal needs that have been historically problematic and identify healthy behaviors to address their needs.  Self inventory  Participation Level:  Active  Participation Quality:  Appropriate  Affect:  Appropriate  Cognitive:  Appropriate  Insight:  Developing/Improving  Engagement in Group:  Developing/Improving  Additional Comments:    Andrena Mews 01/06/2013,10:44 AM

## 2013-01-06 NOTE — Progress Notes (Deleted)
Psychoeducational Group Note  Date:  01/06/2013 Time:  1300 Group Topic/Focus:  Identifying Needs:   The focus of this group is to help patients identify their personal needs that have been historically problematic and identify healthy behaviors to address their needs.  Participation Level:  active Participation Quality: good Affect: flat Cognitive: good  Insight:  good  Engagement in Group: engaged  Additional Comments: Pt was engaged in group, asked appropriate questions, shared personal life experience with the group and demonstrates willingness to process and understand her problems. PDuke RN BC   

## 2013-01-06 NOTE — Progress Notes (Signed)
Patient did attend the evening speaker AA meeting.  

## 2013-01-06 NOTE — Progress Notes (Signed)
Pt observed up and around milieu today. Interacts with staff and peers. Loud and boisterous at times. On 1:1 reports anxiety and requests prn librium. Denies any pain or physical problems. Medicated with librium without difficulty. Support offered. Playing cards with peers tonight. Denies SI/HI/AVh and remains safe. Derrick [name]

## 2013-01-07 NOTE — Progress Notes (Signed)
Psychoeducational Group Note  Date:  01/07/2013 Time:  0945 am  Group Topic/Focus:  Making Healthy Choices:   The focus of this group is to help patients identify negative/unhealthy choices they were using prior to admission and identify positive/healthier coping strategies to replace them upon discharge.  Participation Level:  Active  Participation Quality:  Appropriate and Intrusive  Affect:  Irritable  Cognitive:  Alert  Insight:  Limited  Engagement in Group:  Engaged  Additional Comments:    Andrena Mews 01/07/2013, 10:29 AM

## 2013-01-07 NOTE — Progress Notes (Signed)
Adult Psychoeducational Group Note  Date: 01/07/2013  Time: 3:37 PM  Group Topic/Focus:  Conflict Resolution: The focus of this group is to discuss the conflict resolution process and how it may be used upon discharge.  Participation Level: Active  Participation Quality: Appropriate, Sharing and Supportive  Affect: Appropriate  Cognitive: Appropriate  Insight: Appropriate  Engagement in Group: Engaged and Supportive  Modes of Intervention: Discussion, Education and Support  Additional Comments: Pt was appropriate, no signs of stress or discomfort. Pt was a lot calmer today during group. Derrick [name] M  01/07/2013, 3:37 PM

## 2013-01-07 NOTE — [facility] Group Notes (Addendum)
[facility] Group Notes:  (Clinical Social Work)  01/07/2013  10:00-11:00AM  Summary of Progress/Problems:   The main focus of today's process group was to   identify the patient's current support system and decide on other supports that can be put in place.  Four definitions/levels of support were discussed and an exercise was utilized to show how much stronger we become with additional supports.  An emphasis was placed on using counselor, doctor, therapy groups, 12-step groups, and problem-specific support groups to expand supports, as well as doing something different than has been done before. The patient expressed a willingness to add an individual therapist and had a lot of questions about how to find the right one for him.  He insisted he wants to get his Valium back, and stated he does not need to work on his substance abuse but only wants help with his violent mood swings.  The group confronted him about this, and suggested he would end up back where he is now if he tries to do that.  He accepted their advice well.  He is under house arrest so is limited as to where he can go.  He does have the AA/NA meetings he will attend picked out.  He was extremely disruptive throughout class, coming and going, having side conversations, erupting at the beginning of class about how unhelpful this topic was.  Two group members called him on it and he responded fairly gracefully.  Type of Therapy:  Process Group with Motivational Interviewing  Participation Level:  Minimal  Participation Quality:  Intrusive, Inattentive, Resistant and Sharing  Affect:  Blunted  Cognitive:  Oriented  Insight:  Limited  Engagement in Therapy:  Limited  Modes of Intervention:   Education, Support and Processing, Activity  Ambrose Mantle, LCSW 01/07/2013, 9:31 AM

## 2013-01-07 NOTE — Progress Notes (Signed)
[facility] MD Progress Note  01/07/2013 3:50 PM Derrick [name]  MRN:  [id number] Subjective:  Derrick [name] is still trying to get his life back together. Still endorses the mood instability. Stares he has a hard time dealing with emotions. States that he binge eats and throws up. He would be able to relived some of his emotions build up in that way. He is afraid now of losing control and hurting himself like he used to do. Sees his anger build up when he talks to his girlfriend over phone. Admits he is not there yet. Worried about his mother and not knowing how he is going to react when she passes Diagnosis:  Mood Disorder NOS, Impulse control NOS  ADL's:  Intact  Sleep: Fair  Appetite:  Fair  Suicidal Ideation:  Plan:  denies Intent:  denies Means:  denies Homicidal Ideation:  Plan:  denies Intent:  denies Means:  denies AEB (as evidenced by):  Psychiatric Specialty Exam: Review of Systems  Constitutional: Negative.   HENT: Negative.   Eyes: Negative.   Respiratory: Negative.   Cardiovascular: Negative.   Gastrointestinal: Negative.   Genitourinary: Negative.   Musculoskeletal: Negative.   Skin: Negative.   Neurological: Negative.   Endo/Heme/Allergies: Negative.   Psychiatric/Behavioral: Positive for depression and substance abuse. The patient is nervous/anxious.     Blood pressure 130/84, pulse 76, temperature 98.9 F (37.2 C), temperature source Oral, resp. rate 18.There is no weight on file to calculate BMI.  General Appearance: Fairly Groomed  Patent attorney::  Fair  Speech:  Clear and Coherent  Volume:  Increased  Mood:  Anxious, Depressed and Irritable  Affect:  anxious, irritated  Thought Process:  Coherent and Goal Directed  Orientation:  Full (Time, Place, and Person)  Thought Content:  Rumination and fear of losing control  Suicidal Thoughts:  No  Homicidal Thoughts:  No  Memory:  Immediate;   Fair Recent;   Fair Remote;   Fair  Judgement:  Fair  Insight:  Present   Psychomotor Activity:  Restlessness  Concentration:  Fair  Recall:  Fair  Akathisia:  No  Handed:  Right  AIMS (if indicated):     Assets:  Desire for Improvement  Sleep:  Number of Hours: 5.25   Current Medications: Current Facility-Administered Medications  Medication Dose Route Frequency Provider Last Rate Last Dose  . acetaminophen (TYLENOL) tablet 650 mg  650 mg Oral Q6H PRN Sanjuana Kava, NP      . alum & mag hydroxide-simeth (MAALOX/MYLANTA) 200-200-20 MG/5ML suspension 30 mL  30 mL Oral Q4H PRN Sanjuana Kava, NP   30 mL at 01/05/13 1947  . carbamazepine (EQUETRO) 12 hr capsule 200 mg  200 mg Oral BID Rachael Fee, MD   200 mg at 01/07/13 4696  . chlordiazePOXIDE (LIBRIUM) capsule 25 mg  25 mg Oral TID PRN Rachael Fee, MD   25 mg at 01/07/13 1015  . diphenhydrAMINE (BENADRYL) capsule 50 mg  50 mg Oral QHS PRN,MR X 1 Rachael Fee, MD      . FLUoxetine (PROZAC) capsule 20 mg  20 mg Oral Daily Rachael Fee, MD   20 mg at 01/07/13 2952  . gabapentin (NEURONTIN) capsule 300 mg  300 mg Oral TID Rachael Fee, MD   300 mg at 01/07/13 1206  . ibuprofen (ADVIL,MOTRIN) tablet 400 mg  400 mg Oral Q4H PRN Rachael Fee, MD   400 mg at 01/05/13 1947  . magnesium hydroxide (MILK  OF MAGNESIA) suspension 30 mL  30 mL Oral Daily PRN Sanjuana Kava, NP      . nicotine (NICODERM CQ - dosed in mg/24 hours) patch 21 mg  21 mg Transdermal Q0600 Sanjuana Kava, NP   21 mg at 01/07/13 4098    Lab Results: No results found for this or any previous visit (from the past 48 hour(s)).  Physical Findings: AIMS: Facial and Oral Movements Muscles of Facial Expression: None, normal Lips and Perioral Area: None, normal Jaw: None, normal Tongue: None, normal,Extremity Movements Upper (arms, wrists, hands, fingers): None, normal Lower (legs, knees, ankles, toes): None, normal, Trunk Movements Neck, shoulders, hips: None, normal, Overall Severity Severity of abnormal movements (highest score from questions  above): None, normal Incapacitation due to abnormal movements: None, normal Patient's awareness of abnormal movements (rate only patient's report): No Awareness, Dental Status Current problems with teeth and/or dentures?: No Does patient usually wear dentures?: No  CIWA:    COWS:  COWS Total Score: 3  Treatment Plan Summary: Daily contact with patient to assess and evaluate symptoms and progress in treatment Medication management  Plan: Supportive approach/coping skills/relapse prevention           Tegretol level           Anger Management  Medical Decision Making Problem Points:  Review of psycho-social stressors (1) Data Points:  Review of medication regiment & side effects (2) Review of new medications or change in dosage (2)  I certify that inpatient services furnished can reasonably be expected to improve the patient's condition.   [name] A 01/07/2013, 3:50 PM

## 2013-01-07 NOTE — Progress Notes (Signed)
Pt remains loud, intrusive and inappropriate at times though is redirectable. Currently in AA group. Complains of a headache of a "25000/10." Medicated with ibuprofen prn. Support and redirection given. No other problems voiced. No distress. Continues to worry about mood instability. Denies SI/HI/AVh and remains safe. Lawrence Marseilles

## 2013-01-07 NOTE — Progress Notes (Signed)
Patient did attend the evening speaker AA meeting.  

## 2013-01-07 NOTE — Progress Notes (Signed)
Psychoeducational Group Note  Date: 01/07/2013 Time: 1300  Group Topic/Focus:  Making Healthy Choices:   The focus of this group is to help patients identify negative/unhealthy choices they were using prior to admission and identify positive/healthier coping strategies to replace them upon discharge.  Participation Level:  Active  Participation Quality:  Appropriate  Affect:  Appropriate  Cognitive:  Alert  Insight:  Engaged  Engagement in Group:  Engaged  Additional Comments:    01/07/2013,3:10 PM Johann Gascoigne, Joie Bimler

## 2013-01-08 MED ORDER — CARBAMAZEPINE ER 100 MG PO CP12
200.0000 mg | ORAL_CAPSULE | Freq: Two times a day (BID) | ORAL | Status: DC
  Filled 2013-01-08: qty 56

## 2013-01-08 MED ORDER — CARBAMAZEPINE ER 200 MG PO CP12: 200.0000 mg | ORAL_CAPSULE | Freq: Two times a day (BID) | ORAL | Status: DC

## 2013-01-08 MED ORDER — FLUOXETINE HCL 20 MG PO CAPS: 20.0000 mg | ORAL_CAPSULE | Freq: Every day | ORAL | Status: DC

## 2013-01-08 MED ORDER — GABAPENTIN 300 MG PO CAPS: 300.0000 mg | ORAL_CAPSULE | Freq: Three times a day (TID) | ORAL | Status: DC
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
## 2013-01-08 NOTE — [facility] Suicide Risk Assessment (Signed)
Suicide Risk Assessment  Discharge Assessment     Demographic Factors:  Male and Caucasian  Mental Status Per Nursing Assessment::   On Admission:  Suicidal ideation indicated by patient;Suicide plan;Plan includes specific time, place, or method;Self-harm behaviors;Belief that plan would result in death  Current Mental Status by Physician: In full contact with reality. There are no suicidal ideas, plans or intent. He feels his medications are working for him. He is wanting to go to [facility] to further work on his recovery. 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 States he is going to avoid getting involved in any relationships right now. Plans to continue to work on himself   Loss Factors: Loss of significant relationship  Historical Factors: NA  Risk Reduction Factors:   Sense of responsibility to family, Living with another person, especially a relative and Positive social support  Continued Clinical Symptoms:  Alcohol/Substance Abuse/Dependencies, Impulsivity, Mood Disorder  Cognitive Features That Contribute To Risk:  Closed-mindedness Polarized thinking Thought constriction (tunnel vision)    Suicide Risk:  Minimal: No identifiable suicidal ideation.  Patients presenting with no risk factors but with morbid ruminations; may be classified as minimal risk based on the severity of the depressive symptoms  Discharge Diagnoses:   AXIS I:  Impulse control NOS, Mood Disorder NOS, Substance Abuse AXIS II:  Deferred AXIS III:   Past Medical History  Diagnosis Date  . Bipolar 1 disorder    AXIS IV:  problems with primary support group AXIS V:  61-70 mild symptoms  Plan Of Care/Follow-up recommendations:  Activity:  As tolerated Diet:  regular Follow up [facility] Is patient on multiple antipsychotic therapies at discharge:  No   Has Patient had three or more failed trials of antipsychotic monotherapy by history:   No  Recommended Plan for Multiple Antipsychotic Therapies: N/A   Derrick [name] A 01/08/2013, 1:15 PM

## 2013-01-08 NOTE — Progress Notes (Signed)
[facility] Adult Case Management Discharge Plan :  Will you be returning to the same living situation after discharge: Yes,  to grandmothers At discharge, do you have transportation home?:Yes,  grandmother Do you have the ability to pay for your medications:Yes,  at [facility]  Release of information consent forms completed and in the chart;  Patient's signature needed at discharge.  Patient to Follow up at: Follow-up Information   Follow up with [facility] . (Go to Walk In Clinic at [facility] on Wed 5/21 or Thursday 5/22 between the hours of 8 AM and 3 PM. The first appointment will involve some wait time thus the earlier in the day you arrive the shorter your wait hopefully)    Contact information:   [address] [place], [place] [address] [facility] [phone] FAX [phone]      Follow up with [facility] . (If no date and time filled in you can reach [place] at [phone] as she will arrange date if they can accept you under current house arrest conditions)    Contact information:   [address] [place] [address] [facility] [phone] Valinda Hoar [phone]      Patient denies SI/HI:   Yes,  denies both    Safety Planning and Suicide Prevention discussed:  Yes with patient.   [name] 01/08/2013, 5:34 PM

## 2013-01-08 NOTE — Progress Notes (Signed)
Adult Psychoeducational Group Note  Date:  01/08/2013 Time:  11:01 AM  Group Topic/Focus:  Wellness Toolbox:   The focus of this group is to discuss various aspects of wellness, balancing those aspects and exploring ways to increase the ability to experience wellness.  Patients will create a wellness toolbox for use upon discharge.  Participation Level:  Minimal  Participation Quality:  Redirectable  Affect:  Appropriate  Cognitive:  Appropriate  Insight: Limited  Engagement in Group:  Limited  Modes of Intervention:  Discussion  Additional Comments:  Pt continued to go in and out of group. Pt was redirected due to continuous side conversations. Pt left group before giving any goals for his wellness.   Sharyn Lull 01/08/2013, 11:01 AM

## 2013-01-08 NOTE — [facility] Suicide Risk Assessment (Signed)
[facility] INPATIENT:  Family/Significant Other Suicide Prevention Education  Suicide Prevention Education:  Patient Refusal for Family/Significant Other Suicide Prevention Education: The patient Derrick [name] has refused to provide written consent for family/significant other to be provided Family/Significant Other Suicide Prevention Education during admission and/or prior to discharge.  Physician notified.  Patient refused on Friday 01/05/13 and again today 01/08/2013. Writer provided suicide prevention education directly to patient; conversation included risk factors, warning signs and resources to contact for help. Mobile crisis services explained and contact number placed on Suicide Prevention Information pamphlet which patient was encouraged to share with other family members.  01/08/2013, 5:37 PM

## 2013-01-08 NOTE — Progress Notes (Signed)
Discharged note: Pt discharged to self. Pt received both written and verbal instructions. Pt denies SI/HI/AVH. Pt agreed to f/u appt and med regimen. Pt looking forward to trying to get in at [facility] for treatment. Pt received all belongings from room and locker. Pt received sample meds and prescriptions. Pt safely left the facility with his grandmother.

## 2013-01-08 NOTE — [facility] Group Notes (Signed)
[facility] LCSW Aftercare Discharge Planning Group Note   01/08/2013 8:45AM  Participation Quality:  Active, Intrussive  Mood/Affect:  Anxious  Depression Rating:  2  Anxiety Rating:  10  Thoughts of Suicide:  No Will you contract for safety?   NA  Current AVH:  No  Plan for Discharge/Comments:  Patient can follow up at AA and NA as patient was turned down for [facility], due to house arrest status.  Still waiting to hear regarding placement possibility at [facility].  Patient will follow up at [facility] for medication management.   Transportation Means:  Family  Supports: Family  [name], Derrick Campbell   

## 2013-01-11 NOTE — Progress Notes (Signed)
Patient Discharge Instructions:  After Visit Summary (AVS):   Faxed to:  01/11/13 Discharge Summary Note:   Faxed to:  01/11/13 Psychiatric Admission Assessment Note:   Faxed to:  01/11/13 Suicide Risk Assessment - Discharge Assessment:   Faxed to:  01/11/13 Faxed/Sent to the Next Level Care provider:  01/11/13 Faxed to [facility] @ [phone] Faxed to [facility] @ [phone]  Jerelene Redden, 01/11/2013, 2:32 PM

## 2013-02-20 ENCOUNTER — Emergency Department (HOSPITAL_BASED_OUTPATIENT_CLINIC_OR_DEPARTMENT_OTHER)
Admission: EM | Admit: 2013-02-20 | Discharge: 2013-02-20 | Disposition: A | Discharge status: Home / Self Care | Payer: Self-pay | Attending: Emergency Medicine | Admitting: Emergency Medicine

## 2013-02-20 ENCOUNTER — Encounter (HOSPITAL_BASED_OUTPATIENT_CLINIC_OR_DEPARTMENT_OTHER): Payer: Self-pay | Admitting: *Deleted

## 2013-02-20 DIAGNOSIS — F259 Schizoaffective disorder, unspecified: Secondary | ICD-10-CM | POA: Insufficient documentation

## 2013-02-20 DIAGNOSIS — F17200 Nicotine dependence, unspecified, uncomplicated: Secondary | ICD-10-CM | POA: Insufficient documentation

## 2013-02-20 DIAGNOSIS — H5710 Ocular pain, unspecified eye: Secondary | ICD-10-CM | POA: Insufficient documentation

## 2013-02-20 DIAGNOSIS — F319 Bipolar disorder, unspecified: Secondary | ICD-10-CM | POA: Insufficient documentation

## 2013-02-20 DIAGNOSIS — M542 Cervicalgia: Secondary | ICD-10-CM | POA: Insufficient documentation

## 2013-02-20 DIAGNOSIS — K089 Disorder of teeth and supporting structures, unspecified: Secondary | ICD-10-CM | POA: Insufficient documentation

## 2013-02-20 DIAGNOSIS — Z79899 Other long term (current) drug therapy: Secondary | ICD-10-CM | POA: Insufficient documentation

## 2013-02-20 DIAGNOSIS — K0889 Other specified disorders of teeth and supporting structures: Secondary | ICD-10-CM

## 2013-02-20 DIAGNOSIS — R51 Headache: Secondary | ICD-10-CM | POA: Insufficient documentation

## 2013-02-20 MED ORDER — TRAMADOL HCL 50 MG PO TABS
50.0000 mg | ORAL_TABLET | Freq: Once | ORAL | Status: AC
  Administered 2013-02-20: 50 mg via ORAL
  Filled 2013-02-20: qty 1

## 2013-02-20 MED ORDER — TRAMADOL HCL 50 MG PO TABS: 50.0000 mg | ORAL_TABLET | Freq: Four times a day (QID) | ORAL | Status: DC | PRN

## 2013-02-20 MED ORDER — TETRACAINE HCL 0.5 % OP SOLN
2.0000 [drp] | Freq: Once | OPHTHALMIC | Status: AC
  Administered 2013-02-20: 2 [drp] via OPHTHALMIC
  Filled 2013-02-20: qty 2

## 2013-02-20 MED ORDER — PENICILLIN V POTASSIUM 500 MG PO TABS: 500.0000 mg | ORAL_TABLET | Freq: Four times a day (QID) | ORAL | Status: AC

## 2013-02-20 MED ORDER — FLUORESCEIN SODIUM 1 MG OP STRP
1.0000 | ORAL_STRIP | Freq: Once | OPHTHALMIC | Status: AC
  Administered 2013-02-20: 1 via OPHTHALMIC
  Filled 2013-02-20: qty 1

## 2013-02-20 NOTE — ED Notes (Signed)
Sent here from [facility] for dental pain .

## 2013-02-20 NOTE — ED Provider Notes (Signed)
This chart was scribed for Glynn Octave, MD, by Candelaria Stagers, ED Scribe. This patient was seen in room MH06/MH06 and the patient's care was started at 4:17 PM   History    CSN: [id number] Arrival date & time 02/20/13  1449  First MD Initiated Contact with Patient 02/20/13 1612     Chief Complaint  Patient presents with  . Dental Pain   The history is provided by the patient. No language interpreter was used.   HPI Comments: Derrick [name] is a 27 y.o. male who presents to the Emergency Department from [facility] rehab complaining of right upper dental pain that started about two days ago after an upper molar broke.  He is experiencing associated headaches to the left side of his head and sharp neck pain.  He reports pain surrounding bilateral eyes and to the bilateral eyes.  He denies visual disturbances, nausea, or vomiting.  Nothing seems to make the sx better or worse.  Pt has taken ibuprofen with no relief.  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  Pt has h/o schizoaffective disorder.    Past Medical History  Diagnosis Date  . Bipolar 1 disorder    History reviewed. No pertinent past surgical history. History reviewed. No pertinent family history. History  Substance Use Topics  . Smoking status: Current Every Day Smoker -- 1.00 packs/day    Types: Cigarettes  . Smokeless tobacco: Not on file  . Alcohol Use: Yes     Comment: occassional drinker    Review of Systems  HENT: Positive for dental problem (right upper dental pain).   Eyes: Positive for pain (bilaterally).  Neurological: Positive for headaches.  All other systems reviewed and are negative.    Allergies  Review of patient's allergies indicates no known allergies.  Home Medications   Current Outpatient Rx  Name  Route  Sig  Dispense  Refill  . ibuprofen (ADVIL,MOTRIN) 800 MG tablet   Oral   Take 800 mg by mouth every 8 (eight) hours as needed for pain.         . carbamazepine (EQUETRO)  200 MG CP12   Oral   Take 1 capsule (200 mg total) by mouth 2 (two) times daily. For mood control   60 each   0   . FLUoxetine (PROZAC) 20 MG capsule   Oral   Take 40 mg by mouth daily. For depression         . gabapentin (NEURONTIN) 300 MG capsule   Oral   Take 1 capsule (300 mg total) by mouth 3 (three) times daily. For anxiety/pain management   90 capsule   0   . penicillin v potassium (VEETID) 500 MG tablet   Oral   Take 1 tablet (500 mg total) by mouth 4 (four) times daily.   40 tablet   0   . traMADol (ULTRAM) 50 MG tablet   Oral   Take 1 tablet (50 mg total) by mouth every 6 (six) hours as needed for pain.   15 tablet   0    BP 136/81  Pulse 92  Temp(Src) 98.4 F (36.9 C) (Oral)  Resp 16  Ht 6' (1.829 m)  Wt 190 lb (86.183 kg)  BMI 25.76 kg/m2  SpO2 100% Physical Exam  Nursing note and vitals reviewed. Constitutional: He is oriented to person, place, and time. He appears well-developed and well-nourished.  HENT:  Head: Normocephalic and atraumatic.  Right Ear: External ear normal.  Left  Ear: External ear normal.  Nose: Nose normal.  Mouth/Throat: Oropharynx is clear and moist.  Right upper molar fractured.  No abscess.  Floor of mouth soft.    Eyes: Conjunctivae and EOM are normal. Pupils are equal, round, and reactive to light.  No areas of fluorescin uptake.   Right IOP 19 Left IOP 18 No pain with EOM  Neck: Normal range of motion. Neck supple.  No meningismus.   Cardiovascular: Normal rate, regular rhythm, normal heart sounds and intact distal pulses.   Pulmonary/Chest: Effort normal and breath sounds normal.  Abdominal: Soft. Bowel sounds are normal.  Musculoskeletal: Normal range of motion.  Neurological: He is alert and oriented to person, place, and time. He has normal reflexes.  CN 2-12 intact, no ataxia on finger to nose, no nystagmus, 5/5 strength throughout, no pronator drift, Romberg negative, normal gait.   Skin: Skin is warm and  dry.  Psychiatric: He has a normal mood and affect. His behavior is normal. Thought content normal.    ED Course  Procedures  DIAGNOSTIC STUDIES: Oxygen Saturation is 100% on room air, normal by my interpretation.    COORDINATION OF CARE:  4:22 PM Discussed course of care with pt which includes further eye exam, Ultram, and antibiotics.  Will provide referral to dentist.  Pt understands and agrees.   4:53 PM Eye exam with fluorescin uptake and tonometer.   Labs Reviewed - No data to display No results found. 1. Pain, dental     MDM  History of right-sided upper dental pain causes headache. No fever, difficulty breathing or swallowing.  Pain radiates to eyes at times. No change in vision. Nonfocal neuro exam. Intraocular pressures normal, no fluorescein uptake. Patient at baseline  Suspect pain secondary to dental disease and dental fractures. Will followup with dentistry. Nonnarcotic pain medication given patient's previous drug abuse. He agrees with this. I personally performed the services described in this documentation, which was scribed in my presence. The recorded information has been reviewed and is accurate.    Glynn Octave, MD 02/20/13 [phone]

## 2013-02-22 ENCOUNTER — Emergency Department (HOSPITAL_BASED_OUTPATIENT_CLINIC_OR_DEPARTMENT_OTHER)
Admission: EM | Admit: 2013-02-22 | Discharge: 2013-02-22 | Disposition: A | Discharge status: Home / Self Care | Payer: Self-pay | Attending: Emergency Medicine | Admitting: Emergency Medicine

## 2013-02-22 ENCOUNTER — Encounter (HOSPITAL_BASED_OUTPATIENT_CLINIC_OR_DEPARTMENT_OTHER): Payer: Self-pay

## 2013-02-22 DIAGNOSIS — F411 Generalized anxiety disorder: Secondary | ICD-10-CM | POA: Insufficient documentation

## 2013-02-22 DIAGNOSIS — F319 Bipolar disorder, unspecified: Secondary | ICD-10-CM | POA: Insufficient documentation

## 2013-02-22 DIAGNOSIS — B86 Scabies: Secondary | ICD-10-CM | POA: Insufficient documentation

## 2013-02-22 DIAGNOSIS — Z79899 Other long term (current) drug therapy: Secondary | ICD-10-CM | POA: Insufficient documentation

## 2013-02-22 DIAGNOSIS — Z2089 Contact with and (suspected) exposure to other communicable diseases: Secondary | ICD-10-CM

## 2013-02-22 DIAGNOSIS — F1910 Other psychoactive substance abuse, uncomplicated: Secondary | ICD-10-CM | POA: Insufficient documentation

## 2013-02-22 DIAGNOSIS — F17200 Nicotine dependence, unspecified, uncomplicated: Secondary | ICD-10-CM | POA: Insufficient documentation

## 2013-02-22 HISTORY — DX: Other psychoactive substance abuse, uncomplicated: F19.10

## 2013-02-22 HISTORY — DX: Anxiety disorder, unspecified: F41.9

## 2013-02-22 MED ORDER — PERMETHRIN 5 % EX CREA: TOPICAL_CREAM | CUTANEOUS | Status: DC

## 2013-02-22 NOTE — ED Provider Notes (Signed)
History    CSN: [id number] Arrival date & time 02/22/13  4098  First MD Initiated Contact with Patient 02/22/13 [phone]     Chief Complaint  Patient presents with  . Rash   (Consider location/radiation/quality/duration/timing/severity/associated sxs/prior Treatment) HPI Comments: Patient presents after an exposure to scabies. He's currently edema or treatment) his room he has scabies. They sent him over here to be checked out for scabies. He currently denies any rash or itching.  Patient is a 27 y.o. male presenting with rash.  Rash Associated symptoms: no fever, no nausea and no vomiting    Past Medical History  Diagnosis Date  . Bipolar 1 disorder   . Substance abuse   . Anxiety    History reviewed. No pertinent past surgical history. No family history on file. History  Substance Use Topics  . Smoking status: Current Every Day Smoker -- 1.00 packs/day    Types: Cigarettes  . Smokeless tobacco: Not on file  . Alcohol Use: Yes     Comment: occassional drinker last drink 41 days ago    Review of Systems  Constitutional: Negative for fever.  Gastrointestinal: Negative for nausea and vomiting.  Musculoskeletal: Negative.   Skin: Negative for rash.  Neurological: Negative for headaches.    Allergies  Review of patient's allergies indicates no known allergies.  Home Medications   Current Outpatient Rx  Name  Route  Sig  Dispense  Refill  . carbamazepine (EQUETRO) 200 MG CP12   Oral   Take 1 capsule (200 mg total) by mouth 2 (two) times daily. For mood control   60 each   0   . FLUoxetine (PROZAC) 20 MG capsule   Oral   Take 40 mg by mouth daily. For depression         . gabapentin (NEURONTIN) 300 MG capsule   Oral   Take 1 capsule (300 mg total) by mouth 3 (three) times daily. For anxiety/pain management   90 capsule   0   . ibuprofen (ADVIL,MOTRIN) 800 MG tablet   Oral   Take 800 mg by mouth every 8 (eight) hours as needed for pain.         Marland Kitchen  penicillin v potassium (VEETID) 500 MG tablet   Oral   Take 1 tablet (500 mg total) by mouth 4 (four) times daily.   40 tablet   0   . permethrin (ELIMITE) 5 % cream      Apply to affected area once   60 g   0   . traMADol (ULTRAM) 50 MG tablet   Oral   Take 1 tablet (50 mg total) by mouth every 6 (six) hours as needed for pain.   15 tablet   0    BP 115/73  Pulse 88  Temp(Src) 98.3 F (36.8 C) (Oral)  SpO2 100% Physical Exam  Constitutional: He is oriented to person, place, and time. He appears well-developed and well-nourished.  Cardiovascular: Normal rate.   Pulmonary/Chest: Effort normal.  Neurological: He is alert and oriented to person, place, and time.  Skin: Skin is warm and dry. No rash noted.  Psychiatric: He has a normal mood and affect.    ED Course  Procedures (including critical care time) Labs Reviewed - No data to display No results found. 1. Scabies exposure     MDM  Patient has no current signs of rabies. He was discharged home in good condition. I advised him to wash all the sheets and clothes in  their bedroom will need to be washed. I did give him prescription for Elimite in case he develops signs and symptoms of scabies which he is familiar with because he's had in the past. Advised to return here as needed.  Rolan Bucco, MD 02/22/13 [phone]

## 2013-02-22 NOTE — ED Notes (Signed)
Pt reports he is here for treatment of scabies.  Pt resides at [facility] and his roommate has scabies.  No obvious rash noted.

## 2013-02-22 NOTE — ED Notes (Signed)
No visible rash noted and pt has no complaints.

## 2013-02-26 ENCOUNTER — Encounter (HOSPITAL_BASED_OUTPATIENT_CLINIC_OR_DEPARTMENT_OTHER): Payer: Self-pay | Admitting: *Deleted

## 2013-02-26 ENCOUNTER — Emergency Department (HOSPITAL_BASED_OUTPATIENT_CLINIC_OR_DEPARTMENT_OTHER): Payer: Self-pay

## 2013-02-26 ENCOUNTER — Emergency Department (HOSPITAL_BASED_OUTPATIENT_CLINIC_OR_DEPARTMENT_OTHER)
Admission: EM | Admit: 2013-02-26 | Discharge: 2013-02-26 | Disposition: A | Discharge status: Home / Self Care | Payer: Self-pay | Attending: Emergency Medicine | Admitting: Emergency Medicine

## 2013-02-26 DIAGNOSIS — Z8659 Personal history of other mental and behavioral disorders: Secondary | ICD-10-CM | POA: Insufficient documentation

## 2013-02-26 DIAGNOSIS — F419 Anxiety disorder, unspecified: Secondary | ICD-10-CM

## 2013-02-26 DIAGNOSIS — F17200 Nicotine dependence, unspecified, uncomplicated: Secondary | ICD-10-CM | POA: Insufficient documentation

## 2013-02-26 DIAGNOSIS — R0602 Shortness of breath: Secondary | ICD-10-CM | POA: Insufficient documentation

## 2013-02-26 DIAGNOSIS — F319 Bipolar disorder, unspecified: Secondary | ICD-10-CM | POA: Insufficient documentation

## 2013-02-26 DIAGNOSIS — F411 Generalized anxiety disorder: Secondary | ICD-10-CM | POA: Insufficient documentation

## 2013-02-26 LAB — RAPID URINE DRUG SCREEN, HOSP PERFORMED: Opiates: NOT DETECTED

## 2013-02-26 NOTE — ED Notes (Signed)
Pt. On phone in room 9 talking constantly and loud on the phone.  No distress noted in Pt.   Pt. Still appears anxious.

## 2013-02-26 NOTE — ED Notes (Signed)
Chest pain. Is an inpatient at [facility] for Crack and alcohol addiction.

## 2013-02-26 NOTE — ED Notes (Signed)
Pt. Is highly anxious with multi system complaints and no distress noted.  Pt. Is afraid of multiple things being wrong with his health.  Pt. Is highly anxious.

## 2013-02-26 NOTE — ED Provider Notes (Signed)
History  This chart was scribed for Ethelda Chick, MD by Ardeen Jourdain, ED Scribe. This patient was seen in room MH09/MH09 and the patient's care was started at 1822.  CSN: [id number] Arrival date & time 02/26/13  1821   First MD Initiated Contact with Patient 02/26/13 1822     Chief Complaint  Patient presents with  . Chest Pain    Patient is a 27 y.o. male presenting with chest pain. The history is provided by the patient. No language interpreter was used.  Chest Pain Pain location:  L chest Pain quality: sharp and tearing   Pain radiates to:  Neck and upper back Pain radiates to the back: yes   Pain severity:  Mild Onset quality:  Gradual Timing:  Intermittent Progression:  Worsening Chronicity:  New Context: at rest   Relieved by:  Certain positions Worsened by:  Deep breathing and coughing Ineffective treatments:  Rest Associated symptoms: shortness of breath   Associated symptoms: no back pain, no claudication, no cough, no diaphoresis, no fatigue, no fever, no nausea, no palpitations, not vomiting and no weakness   Shortness of breath:    Severity:  Moderate   Onset quality:  Gradual   Timing:  Constant   Progression:  Worsening Risk factors: male sex   Risk factors comment:  Crack addict    HPI Comments: HAMDI KLEY is a 27 y.o. male who presents to the Emergency Department complaining of gradual onset, gradually worsening, constant SOB and intermittent CP that began a few hours ago. He is currently an inpatient at [facility] for crack and alcohol addiction. He states he has been in [facility] for 29 days and was incarcerated for 20 days prior to that. He locates the pain to his left chest and states it radiates to his left neck and left upper back. He states the symptoms began a few hours ago after trying to nap. He states his heart is beating heavily. He states he had his blood pressure taken and it was 160/89. He denies any previous similar symptoms.   Past  Medical History  Diagnosis Date  . Bipolar 1 disorder   . Substance abuse   . Anxiety    History reviewed. No pertinent past surgical history. No family history on file. History  Substance Use Topics  . Smoking status: Current Every Day Smoker -- 1.00 packs/day    Types: Cigarettes  . Smokeless tobacco: Not on file  . Alcohol Use: Yes     Comment: occassional drinker last drink 41 days ago    Review of Systems  Constitutional: Negative for fever, diaphoresis and fatigue.  Respiratory: Positive for chest tightness and shortness of breath. Negative for cough.   Cardiovascular: Positive for chest pain. Negative for palpitations and claudication.  Gastrointestinal: Negative for nausea and vomiting.  Musculoskeletal: Negative for back pain.  Neurological: Negative for weakness.  All other systems reviewed and are negative.    Allergies  Review of patient's allergies indicates no known allergies.  Home Medications   Current Outpatient Rx  Name  Route  Sig  Dispense  Refill  . carbamazepine (EQUETRO) 200 MG CP12   Oral   Take 1 capsule (200 mg total) by mouth 2 (two) times daily. For mood control   60 each   0   . FLUoxetine (PROZAC) 20 MG capsule   Oral   Take 40 mg by mouth daily. For depression         . gabapentin (NEURONTIN) 300  MG capsule   Oral   Take 1 capsule (300 mg total) by mouth 3 (three) times daily. For anxiety/pain management   90 capsule   0   . ibuprofen (ADVIL,MOTRIN) 800 MG tablet   Oral   Take 800 mg by mouth every 8 (eight) hours as needed for pain.         Marland Kitchen penicillin v potassium (VEETID) 500 MG tablet   Oral   Take 1 tablet (500 mg total) by mouth 4 (four) times daily.   40 tablet   0   . permethrin (ELIMITE) 5 % cream      Apply to affected area once   60 g   0   . traMADol (ULTRAM) 50 MG tablet   Oral   Take 1 tablet (50 mg total) by mouth every 6 (six) hours as needed for pain.   15 tablet   0    Triage Vitals: BP  125/78  Pulse 90  Temp(Src) 98.9 F (37.2 C) (Oral)  Resp 20  Ht 6' (1.829 m)  Wt 190 lb (86.183 kg)  BMI 25.76 kg/m2  SpO2 100%  Physical Exam  Nursing note and vitals reviewed. Constitutional: He is oriented to person, place, and time. He appears well-developed and well-nourished. No distress.  HENT:  Head: Normocephalic and atraumatic.  Eyes: EOM are normal. Pupils are equal, round, and reactive to light.  Neck: Normal range of motion. Neck supple. No tracheal deviation present.  Cardiovascular: Normal rate, regular rhythm and normal heart sounds.  Exam reveals no gallop and no friction rub.   No murmur heard. Pulmonary/Chest: Effort normal and breath sounds normal. No respiratory distress. He has no wheezes. He has no rales. He exhibits no tenderness.  Abdominal: Soft. Bowel sounds are normal. He exhibits no distension.  Musculoskeletal: Normal range of motion. He exhibits no edema.  Neurological: He is alert and oriented to person, place, and time.  Skin: Skin is warm and dry.  Psychiatric: His behavior is normal.  Anxious     ED Course  Procedures (including critical care time)  DIAGNOSTIC STUDIES: Oxygen Saturation is 100% on room air, normal by my interpretation.    COORDINATION OF CARE:  6:47 PM-Discussed treatment plan which includes CXR and EKG with pt at bedside and pt agreed to plan.    Date: 02/26/2013  Rate: 80  Rhythm: normal sinus rhythm  QRS Axis: normal  Intervals: normal  ST/T Wave abnormalities: normal  Conduction Disutrbances: none  Narrative Interpretation: unremarkable     Labs Reviewed  URINE RAPID DRUG SCREEN (HOSP PERFORMED)   Dg Chest 2 View  02/26/2013   *RADIOLOGY REPORT*  Clinical Data: Chest pain.  CHEST - 2 VIEW  Comparison: 04/07/2010  Findings: Heart and mediastinal contours are within normal limits. No focal opacities or effusions.  No acute bony abnormality.  IMPRESSION: No active cardiopulmonary disease.   Original Report  Authenticated By: Charlett Nose, M.D.    1. Shortness of breath   2. Anxiety     MDM  Pt presnting with c/o feeling short of breath with some chest pains associated. He is anxious and with pressured speech.  No risk factors of PE.  EKG normal and CXR normal, normal exam.  Low suspicion for ACS, PERC 0.  Results d/w patient.  He is very concerned that he has cancer as his mother has lung cancer at this time.  Discharged with strict return precautions.  Pt agreeable with plan.   I personally performed  the services described in this documentation, which was scribed in my presence. The recorded information has been reviewed and is accurate.    Ethelda Chick, MD 02/27/13 [phone]

## 2013-05-01 ENCOUNTER — Encounter (HOSPITAL_COMMUNITY): Payer: Self-pay | Admitting: Emergency Medicine

## 2013-05-01 ENCOUNTER — Emergency Department (HOSPITAL_COMMUNITY)
Admission: EM | Admit: 2013-05-01 | Discharge: 2013-05-02 | Disposition: A | Discharge status: Other Acute Inpatient Hospital | Payer: No Typology Code available for payment source | Attending: Emergency Medicine | Admitting: Emergency Medicine

## 2013-05-01 DIAGNOSIS — F17200 Nicotine dependence, unspecified, uncomplicated: Secondary | ICD-10-CM | POA: Insufficient documentation

## 2013-05-01 DIAGNOSIS — T1491XA Suicide attempt, initial encounter: Secondary | ICD-10-CM | POA: Diagnosis present

## 2013-05-01 DIAGNOSIS — F1910 Other psychoactive substance abuse, uncomplicated: Secondary | ICD-10-CM | POA: Insufficient documentation

## 2013-05-01 DIAGNOSIS — T424X4A Poisoning by benzodiazepines, undetermined, initial encounter: Secondary | ICD-10-CM | POA: Insufficient documentation

## 2013-05-01 DIAGNOSIS — F411 Generalized anxiety disorder: Secondary | ICD-10-CM | POA: Insufficient documentation

## 2013-05-01 DIAGNOSIS — X789XXA Intentional self-harm by unspecified sharp object, initial encounter: Secondary | ICD-10-CM | POA: Insufficient documentation

## 2013-05-01 DIAGNOSIS — R45851 Suicidal ideations: Secondary | ICD-10-CM

## 2013-05-01 DIAGNOSIS — F329 Major depressive disorder, single episode, unspecified: Secondary | ICD-10-CM | POA: Insufficient documentation

## 2013-05-01 DIAGNOSIS — IMO0002 Reserved for concepts with insufficient information to code with codable children: Secondary | ICD-10-CM | POA: Insufficient documentation

## 2013-05-01 DIAGNOSIS — F319 Bipolar disorder, unspecified: Secondary | ICD-10-CM | POA: Insufficient documentation

## 2013-05-01 DIAGNOSIS — Z79899 Other long term (current) drug therapy: Secondary | ICD-10-CM | POA: Insufficient documentation

## 2013-05-01 DIAGNOSIS — I10 Essential (primary) hypertension: Secondary | ICD-10-CM | POA: Insufficient documentation

## 2013-05-01 DIAGNOSIS — T43502A Poisoning by unspecified antipsychotics and neuroleptics, intentional self-harm, initial encounter: Secondary | ICD-10-CM | POA: Insufficient documentation

## 2013-05-01 DIAGNOSIS — S0003XA Contusion of scalp, initial encounter: Secondary | ICD-10-CM | POA: Insufficient documentation

## 2013-05-01 NOTE — ED Notes (Signed)
Pt has blue short, blue boxers locked up in locker 29 in [place]

## 2013-05-01 NOTE — ED Notes (Signed)
Bed: WA09 Expected date:  Expected time:  Means of arrival:  Comments: Triage 3 
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
## 2013-05-02 ENCOUNTER — Encounter (HOSPITAL_COMMUNITY): Payer: Self-pay | Admitting: *Deleted

## 2013-05-02 ENCOUNTER — Encounter (HOSPITAL_COMMUNITY): Payer: Self-pay | Admitting: Registered Nurse

## 2013-05-02 ENCOUNTER — Inpatient Hospital Stay (HOSPITAL_COMMUNITY)
Admission: AD | Admit: 2013-05-02 | Discharge: 2013-05-10 | DRG: 897 | Disposition: A | Discharge status: Home / Self Care | Payer: No Typology Code available for payment source | Source: Intra-hospital | Attending: Psychiatry | Admitting: Psychiatry

## 2013-05-02 DIAGNOSIS — R45851 Suicidal ideations: Secondary | ICD-10-CM

## 2013-05-02 DIAGNOSIS — T1491XA Suicide attempt, initial encounter: Secondary | ICD-10-CM | POA: Diagnosis present

## 2013-05-02 DIAGNOSIS — F411 Generalized anxiety disorder: Secondary | ICD-10-CM

## 2013-05-02 DIAGNOSIS — F1910 Other psychoactive substance abuse, uncomplicated: Secondary | ICD-10-CM

## 2013-05-02 DIAGNOSIS — F1010 Alcohol abuse, uncomplicated: Secondary | ICD-10-CM

## 2013-05-02 DIAGNOSIS — F639 Impulse disorder, unspecified: Secondary | ICD-10-CM

## 2013-05-02 DIAGNOSIS — X838XXA Intentional self-harm by other specified means, initial encounter: Secondary | ICD-10-CM

## 2013-05-02 DIAGNOSIS — F1420 Cocaine dependence, uncomplicated: Secondary | ICD-10-CM | POA: Diagnosis present

## 2013-05-02 DIAGNOSIS — F329 Major depressive disorder, single episode, unspecified: Secondary | ICD-10-CM | POA: Diagnosis present

## 2013-05-02 DIAGNOSIS — F1220 Cannabis dependence, uncomplicated: Secondary | ICD-10-CM | POA: Diagnosis present

## 2013-05-02 DIAGNOSIS — F39 Unspecified mood [affective] disorder: Secondary | ICD-10-CM

## 2013-05-02 DIAGNOSIS — F319 Bipolar disorder, unspecified: Secondary | ICD-10-CM | POA: Diagnosis present

## 2013-05-02 DIAGNOSIS — F1320 Sedative, hypnotic or anxiolytic dependence, uncomplicated: Principal | ICD-10-CM | POA: Diagnosis present

## 2013-05-02 DIAGNOSIS — I10 Essential (primary) hypertension: Secondary | ICD-10-CM | POA: Diagnosis present

## 2013-05-02 DIAGNOSIS — Z79899 Other long term (current) drug therapy: Secondary | ICD-10-CM

## 2013-05-02 HISTORY — DX: Essential (primary) hypertension: I10

## 2013-05-02 HISTORY — DX: Headache: R51

## 2013-05-02 LAB — BASIC METABOLIC PANEL
CO2: 29 mEq/L (ref 19–32)
Calcium: 9.8 mg/dL (ref 8.4–10.5)
Creatinine, Ser: 1.05 mg/dL (ref 0.50–1.35)
Glucose, Bld: 87 mg/dL (ref 70–99)
Sodium: 136 mEq/L (ref 135–145)

## 2013-05-02 LAB — CBC
Hemoglobin: 14.9 g/dL (ref 13.0–17.0)
MCH: 31 pg (ref 26.0–34.0)
MCV: 90 fL (ref 78.0–100.0)
RBC: 4.8 MIL/uL (ref 4.22–5.81)

## 2013-05-02 LAB — RAPID URINE DRUG SCREEN, HOSP PERFORMED
Amphetamines: NOT DETECTED
Benzodiazepines: POSITIVE — AB
Opiates: NOT DETECTED
Tetrahydrocannabinol: POSITIVE — AB

## 2013-05-02 LAB — SALICYLATE LEVEL: Salicylate Lvl: <2 mg/dL — ABNORMAL LOW (ref 2.8–20.0)

## 2013-05-02 MED ORDER — NAPROXEN 500 MG PO TABS
500.0000 mg | ORAL_TABLET | Freq: Two times a day (BID) | ORAL | Status: AC | PRN
  Administered 2013-05-02 – 2013-05-07 (×4): 500 mg via ORAL
  Filled 2013-05-02 (×4): qty 1

## 2013-05-02 MED ORDER — ACETAMINOPHEN 325 MG PO TABS
650.0000 mg | ORAL_TABLET | Freq: Four times a day (QID) | ORAL | Status: DC | PRN
  Administered 2013-05-03 – 2013-05-10 (×3): 650 mg via ORAL

## 2013-05-02 MED ORDER — CLONIDINE HCL 0.1 MG PO TABS
0.1000 mg | ORAL_TABLET | Freq: Four times a day (QID) | ORAL | Status: AC
  Administered 2013-05-02 – 2013-05-05 (×9): 0.1 mg via ORAL
  Filled 2013-05-02 (×10): qty 1

## 2013-05-02 MED ORDER — ALUM & MAG HYDROXIDE-SIMETH 200-200-20 MG/5ML PO SUSP: 30.0000 mL | ORAL | Status: DC | PRN

## 2013-05-02 MED ORDER — METHOCARBAMOL 500 MG PO TABS
500.0000 mg | ORAL_TABLET | Freq: Three times a day (TID) | ORAL | Status: AC | PRN
  Administered 2013-05-05 – 2013-05-07 (×4): 500 mg via ORAL
  Filled 2013-05-02 (×4): qty 1

## 2013-05-02 MED ORDER — MAGNESIUM HYDROXIDE 400 MG/5ML PO SUSP: 30.0000 mL | Freq: Every day | ORAL | Status: DC | PRN

## 2013-05-02 MED ORDER — TRAZODONE HCL 50 MG PO TABS
50.0000 mg | ORAL_TABLET | Freq: Every evening | ORAL | Status: DC | PRN
  Filled 2013-05-02: qty 1

## 2013-05-02 MED ORDER — ACETAMINOPHEN 325 MG PO TABS: 650.0000 mg | ORAL_TABLET | ORAL | Status: DC | PRN

## 2013-05-02 MED ORDER — LOPERAMIDE HCL 2 MG PO CAPS: 2.0000 mg | ORAL_CAPSULE | ORAL | Status: AC | PRN

## 2013-05-02 MED ORDER — DICYCLOMINE HCL 20 MG PO TABS
20.0000 mg | ORAL_TABLET | Freq: Four times a day (QID) | ORAL | Status: AC | PRN
  Administered 2013-05-03 – 2013-05-05 (×3): 20 mg via ORAL
  Filled 2013-05-02 (×4): qty 1

## 2013-05-02 MED ORDER — ONDANSETRON 4 MG PO TBDP: 4.0000 mg | ORAL_TABLET | Freq: Four times a day (QID) | ORAL | Status: AC | PRN

## 2013-05-02 MED ORDER — GABAPENTIN 300 MG PO CAPS
300.0000 mg | ORAL_CAPSULE | Freq: Three times a day (TID) | ORAL | Status: DC
  Administered 2013-05-02 (×2): 300 mg via ORAL
  Filled 2013-05-02 (×3): qty 1

## 2013-05-02 MED ORDER — NICOTINE 21 MG/24HR TD PT24
21.0000 mg | MEDICATED_PATCH | Freq: Every day | TRANSDERMAL | Status: DC
  Administered 2013-05-02: 21 mg via TRANSDERMAL

## 2013-05-02 MED ORDER — ONDANSETRON HCL 4 MG PO TABS: 4.0000 mg | ORAL_TABLET | Freq: Three times a day (TID) | ORAL | Status: DC | PRN

## 2013-05-02 MED ORDER — HYDROXYZINE HCL 25 MG PO TABS
25.0000 mg | ORAL_TABLET | Freq: Four times a day (QID) | ORAL | Status: AC | PRN
  Administered 2013-05-04 – 2013-05-07 (×6): 25 mg via ORAL

## 2013-05-02 MED ORDER — CLONIDINE HCL 0.1 MG PO TABS
0.1000 mg | ORAL_TABLET | Freq: Every day | ORAL | Status: AC
  Administered 2013-05-08 – 2013-05-09 (×2): 0.1 mg via ORAL
  Filled 2013-05-02 (×2): qty 1

## 2013-05-02 MED ORDER — CARBAMAZEPINE ER 100 MG PO CP12
200.0000 mg | ORAL_CAPSULE | Freq: Two times a day (BID) | ORAL | Status: DC
  Administered 2013-05-02: 200 mg via ORAL
  Filled 2013-05-02 (×3): qty 2

## 2013-05-02 MED ORDER — DIPHENHYDRAMINE HCL 25 MG PO CAPS
50.0000 mg | ORAL_CAPSULE | Freq: Every evening | ORAL | Status: DC | PRN
  Administered 2013-05-02 – 2013-05-07 (×5): 50 mg via ORAL
  Filled 2013-05-02: qty 2

## 2013-05-02 MED ORDER — ZOLPIDEM TARTRATE 5 MG PO TABS: 5.0000 mg | ORAL_TABLET | Freq: Every evening | ORAL | Status: DC | PRN

## 2013-05-02 MED ORDER — CLONIDINE HCL 0.1 MG PO TABS
0.1000 mg | ORAL_TABLET | ORAL | Status: AC
  Administered 2013-05-05 – 2013-05-06 (×3): 0.1 mg via ORAL
  Filled 2013-05-02 (×4): qty 1

## 2013-05-02 MED ORDER — FLUOXETINE HCL 20 MG PO CAPS
40.0000 mg | ORAL_CAPSULE | Freq: Every day | ORAL | Status: DC
  Administered 2013-05-02: 40 mg via ORAL
  Filled 2013-05-02 (×2): qty 2

## 2013-05-02 MED ORDER — IBUPROFEN 200 MG PO TABS: 600.0000 mg | ORAL_TABLET | Freq: Three times a day (TID) | ORAL | Status: DC | PRN
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
## 2013-05-02 NOTE — BH Assessment (Signed)
[facility] Assessment Progress Note  Per Thurman Coyer, RN, Administrative Coordinator, pt accepted to [facility] to the service of Geoffery Lyons, MD, Rm [phone].  Pt is under IVC.  Doylene Canning, MA Triage Specialist 05/02/2013 @ 18:41

## 2013-05-02 NOTE — ED Notes (Signed)
Spoke to TTS, patient already on the list

## 2013-05-02 NOTE — Progress Notes (Signed)
[facility] provided pt with a [employer], highlighting [facility] [place].
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
## 2013-05-03 ENCOUNTER — Encounter (HOSPITAL_COMMUNITY): Payer: Self-pay | Admitting: Psychiatry

## 2013-05-03 DIAGNOSIS — F39 Unspecified mood [affective] disorder: Secondary | ICD-10-CM

## 2013-05-03 MED ORDER — NICOTINE 21 MG/24HR TD PT24
21.0000 mg | MEDICATED_PATCH | Freq: Every day | TRANSDERMAL | Status: DC
  Administered 2013-05-03 – 2013-05-10 (×8): 21 mg via TRANSDERMAL
  Filled 2013-05-03 (×10): qty 1

## 2013-05-03 MED ORDER — CLONIDINE HCL 0.1 MG PO TABS
0.1000 mg | ORAL_TABLET | Freq: Once | ORAL | Status: AC
  Administered 2013-05-03: 0.1 mg via ORAL
  Filled 2013-05-03: qty 1

## 2013-05-03 MED ORDER — PNEUMOCOCCAL VAC POLYVALENT 25 MCG/0.5ML IJ INJ
0.5000 mL | INJECTION | INTRAMUSCULAR | Status: AC
  Administered 2013-05-04: 0.5 mL via INTRAMUSCULAR

## 2013-05-03 MED ORDER — INFLUENZA VAC SPLIT QUAD 0.5 ML IM SUSP
0.5000 mL | INTRAMUSCULAR | Status: AC
  Administered 2013-05-04: 0.5 mL via INTRAMUSCULAR
  Filled 2013-05-03: qty 0.5

## 2013-05-03 MED ORDER — AMITRIPTYLINE HCL 50 MG PO TABS
50.0000 mg | ORAL_TABLET | Freq: Every day | ORAL | Status: DC
  Administered 2013-05-03 – 2013-05-06 (×4): 50 mg via ORAL
  Filled 2013-05-03 (×4): qty 1
  Filled 2013-05-03: qty 2
  Filled 2013-05-03: qty 1

## 2013-05-03 MED ORDER — ATOMOXETINE HCL 25 MG PO CAPS
25.0000 mg | ORAL_CAPSULE | Freq: Every day | ORAL | Status: DC
  Administered 2013-05-04 – 2013-05-10 (×7): 25 mg via ORAL
  Filled 2013-05-03 (×12): qty 1
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
## 2013-05-03 NOTE — BHH Counselor (Signed)
Adult Psychosocial Assessment Update Interdisciplinary Team  [facility] admissions[facility]discharges:  Admissions Discharges  Date: 01/03/13 Date: 01/08/13  Date: Date:  Date: Date:  Date: Date:  Date: Date:   Changes since the last Psychosocial Assessment (including adherence to outpatient mental health and/or substance abuse treatment, situational issues contributing to decompensation and/or relapse).  Pt reports after last admission he went to [facility] for 30 days, discharging from there in July.  Pt states that he continues to struggle with depression and SI, and reports using drugs to cope.  Pt states that he's been using alcohol, cocaine and marijuana.  Pt states that he is suicidal because "he can't stand his life".               Discharge Plan 1. Will you be returning to the same living situation after discharge?   Yes: X Pt states that he lives with grandparents in [place] and can return home there after treatment.   No:      If no, what is your plan?           2. Would you like a referral for services when you are discharged? Yes:  X    If yes, for what services? Pt states that he wants long term treatment for mental health, asking if he can go to [facility].  CSW discussed [facility] and a more likely option and pt was open to this referral as well.    No:              Summary and Recommendations (to be completed by the evaluator) Patient is a 27 year old Caucasian Male with a diagnosis of Alcohol Abuse, Anxiety Disorder NOS, Substance Abuse and Major Depressive Disorder.  Patient lives in [place] alone.  Patient will benefit from crisis stabilization, medication evaluation, group therapy and psycho education in addition to case management for discharge planning.                         Signature:  Carmina Miller, 05/03/2013 10:00 AM
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
## 2013-05-03 NOTE — BHH Suicide Risk Assessment (Signed)
BHH INPATIENT:  Family/Significant Other Suicide Prevention Education  Suicide Prevention Education:  Education Completed; Deaken Jurgens - grandmother [phone]),  (name of family member/significant other) has been identified by the patient as the family member/significant other with whom the patient will be residing, and identified as the person(s) who will aid the patient in the event of a mental health crisis (suicidal ideations/suicide attempt).  With written consent from the patient, the family member/significant other has been provided the following suicide prevention education, prior to the and/or following the discharge of the patient.  The suicide prevention education provided includes the following:  Suicide risk factors  Suicide prevention and interventions  National Suicide Hotline telephone number  [facility] assessment telephone number  [facility] Emergency Assistance 911  [facility] and/or Residential Mobile Crisis Unit telephone number  Request made of family/significant other to:  Remove weapons (e.g., guns, rifles, knives), all items previously/currently identified as safety concern.    Remove drugs/medications (over-the-counter, prescriptions, illicit drugs), all items previously/currently identified as a safety concern.  The family member/significant other verbalizes understanding of the suicide prevention education information provided.  The family member/significant other agrees to remove the items of safety concern listed above.  Carmina Miller 05/03/2013, 12:46 PM
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
## 2013-05-03 NOTE — [facility] Group Notes (Signed)
[facility] LCSW Group Therapy  05/03/2013  1:15 PM   Type of Therapy:  Group Therapy  Participation Level:  Active  Participation Quality:  Appropriate and Attentive  Affect:  Appropriate, Flat and Depressed  Cognitive:  Alert and Appropriate  Insight:  Developing/Improving and Engaged  Engagement in Therapy:  Developing/Improving and Engaged  Modes of Intervention:  Clarification, Confrontation, Discussion, Education, Exploration, Limit-setting, Orientation, Problem-solving, Rapport Building, Dance movement psychotherapist, Socialization and Support  Summary of Progress/Problems: The topic for group was balance in life.  Today's group focused on defining balance in one's own words, identifying things that can knock one off balance, and exploring healthy ways to maintain balance in life. Group members were asked to provide an example of a time when they felt off balance, describe how they handled that situation,and process healthier ways to regain balance in the future. Group members were asked to share the most important tool for maintaining balance that they learned while at [facility] and how they plan to apply this method after discharge.  Pt shared that he is listening to the group discussion but can only focus on feel scared, stressed out and confused about what will happen when he leaves here.  Pt was able to process his feelings around this and discussed wanting to find himself and find out what is wrong with him, instead of going to jail, as he reports having a warrant out for his arrest right now.  Pt actively listened to group discussion but did appear distracted in his thoughts.     Derrick [name], LCSWA 05/03/2013 2:23 PM

## 2013-05-03 NOTE — Tx Team (Signed)
Initial Interdisciplinary Treatment Plan  PATIENT STRENGTHS: (choose at least two) Average or above average intelligence Capable of independent living General fund of knowledge Motivation for treatment/growth Supportive family/friends  PATIENT STRESSORS: Financial difficulties Legal issue Marital or family conflict Medication change or noncompliance Occupational concerns Substance abuse   PROBLEM LIST: Problem List/Patient Goals Date to be addressed Date deferred Reason deferred Estimated date of resolution  Depression      Risk for self harm      Substance abuse      "I need help with my anxiety and that would help me not abuse drugs.  I do drugs to deal with everything in my life."                                     DISCHARGE CRITERIA:  Adequate post-discharge living arrangements Improved stabilization in mood, thinking, and/or behavior Motivation to continue treatment in a less acute level of care Reduction of life-threatening or endangering symptoms to within safe limits Verbal commitment to aftercare and medication compliance Withdrawal symptoms are absent or subacute and managed without 24-hour nursing intervention  PRELIMINARY DISCHARGE PLAN: Attend aftercare/continuing care group Outpatient therapy Return to previous living arrangement  PATIENT/FAMIILY INVOLVEMENT: This treatment plan has been presented to and reviewed with the patient, Derrick [name], and/or family member.  The patient and family have been given the opportunity to ask questions and make suggestions.  [name] [facility] 05/03/2013, 12:31 AM

## 2013-05-04 DIAGNOSIS — F639 Impulse disorder, unspecified: Secondary | ICD-10-CM

## 2013-05-04 DIAGNOSIS — F909 Attention-deficit hyperactivity disorder, unspecified type: Secondary | ICD-10-CM
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
## 2013-05-04 NOTE — Progress Notes (Signed)
D:Pt reports that he is feeling better today as compared to yesterday. Detox symptoms are decreasing. Pt does report si thoughts that he states that he always has. Pt has been in his room much of the day with the air condition on high. A:Offered support, encouragement and 15 minute checks. R:Pt contracts with staff for safety. Safety maintained on the unit.

## 2013-05-04 NOTE — Progress Notes (Signed)
Patient ID: Derrick [name], male   DOB: [birth year], 27 y.o.   MRN: [id number] D: Patient in room on approach. Pt mood/affect seemed depressed and flat. Pt denies SI/HI/AVH and pain. Pt in his room and refuse to go to group because he is not feeling well. Pt denies any needs or concerns.  Cooperative with assessment. No acute distressed noted at this time.   A: Met with pt 1:1. Medications administered as prescribed. Writer encouraged pt to discuss feelings. Pt encouraged to come to staff with any question or concerns.   R: Patient remains safe. He is complaint with medications and denies any adverse reaction. Continue current POC.

## 2013-05-04 NOTE — [facility] Group Notes (Signed)
[facility] LCSW Aftercare Discharge Planning Group Note   05/04/2013  8:45 AM  Participation Quality:  Did Not Attend  Lilo Wallington Horton, LCSWA 05/04/2013 9:40 AM       

## 2013-05-04 NOTE — Progress Notes (Signed)
Patient ID: Derrick [name], male   DOB: 05/21/1986, 27 y.o.   MRN: [id number] D: Patient in hallway on approach. Pt mood/affect seemed depressed and flat. Pt endorses passive suicidal ideation stating "I having this feeling of hurting myself all the time". Pt verbally contracted with Clinical research associate for safety. Pt denies HI/AVH and pain. Pt attended evening AA group and was engaged in discussion. Pt denies any needs or concerns.  Cooperative with assessment. No acute distressed noted at this time.   A: Met with pt 1:1. Medications administered as prescribed. Writer encouraged pt to discuss feelings. Pt encouraged to come to staff with any question or concerns.   R: Patient remains safe. He is complaint with medications and denies any adverse reaction. Continue current POC.

## 2013-05-04 NOTE — Tx Team (Signed)
Interdisciplinary Treatment Plan Update (Adult)  Date: 05/04/2013  Time Reviewed:  9:45 AM  Progress in Treatment: Attending groups: Yes Participating in groups:  Yes Taking medication as prescribed:  Yes Tolerating medication:  Yes Family/Significant othe contact made: Yes Patient understands diagnosis:  Yes Discussing patient identified problems/goals with staff:  Yes Medical problems stabilized or resolved:  Yes Denies suicidal/homicidal ideation: Yes Issues/concerns per patient self-inventory:  Yes Other:  New problem(s) identified: N/A  Discharge Plan or Barriers: CSW will refer pt to [facility] for further inpatient treatment.    Reason for Continuation of Hospitalization: Anxiety Depression Detox Medication Stabilization  Comments: N/A  Estimated length of stay: 3-5 days  For review of initial/current patient goals, please see plan of care.  Attendees: Patient:     Family:     Physician:  Dr. Dub Mikes 05/04/2013 11:37 AM   Nursing:   Faythe Dingwall, RN 05/04/2013 11:37 AM   Clinical Social Worker:  Reyes Ivan, LCSWA 05/04/2013 11:37 AM   Other: Onnie Boer, RN case manager 05/04/2013 11:37 AM   Other:  Trula Slade, LCSWA 05/04/2013 11:37 AM   Other:  Serena Colonel, NP 05/04/2013 11:37 AM   Other:     Other:    Other:    Other:    Other:    Other:    Other:     Scribe for Treatment Team:   Carmina Miller, 05/04/2013 , 11:37 AM
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
## 2013-05-04 NOTE — Progress Notes (Signed)
Adult Psychoeducational Group Note  Date:  05/04/2013 Time:  11:42 AM  Group Topic/Focus:  Relapse Prevention Planning:   The focus of this group is to define relapse and discuss the need for planning to combat relapse.  Participation Level:  Did Not Attend   Derrick [name] 05/04/2013, 11:42 AM

## 2013-05-05 DIAGNOSIS — F1910 Other psychoactive substance abuse, uncomplicated: Secondary | ICD-10-CM

## 2013-05-05 DIAGNOSIS — F411 Generalized anxiety disorder: Secondary | ICD-10-CM

## 2013-05-05 DIAGNOSIS — F3130 Bipolar disorder, current episode depressed, mild or moderate severity, unspecified: Secondary | ICD-10-CM

## 2013-05-05 MED ORDER — PANTOPRAZOLE SODIUM 40 MG PO TBEC
40.0000 mg | DELAYED_RELEASE_TABLET | Freq: Two times a day (BID) | ORAL | Status: DC
  Administered 2013-05-07 – 2013-05-10 (×7): 40 mg via ORAL
  Filled 2013-05-05 (×14): qty 1

## 2013-05-05 NOTE — Progress Notes (Signed)
Psychoeducational Group Note  Date:  05/05/2013 Time:  0945 am  Group Topic/Focus:  Identifying Needs:   The focus of this group is to help patients identify their personal needs that have been historically problematic and identify healthy behaviors to address their needs.  Participation Level:  Did Not Attend  Andrena Mews 05/05/2013,6:46 PM

## 2013-05-05 NOTE — Progress Notes (Signed)
D.  Pt pleasant but anxious on approach, states he is still having difficulty with withdrawal but does want to come off the clonidine soon.  Pt reported that he can not take Trazodone because it causes priapism for him and he wanted that medication removed from his medication list.  Pt positive for evening wrap up group, see group notes.  Denies SI/HI/hallucinations at this time.  Interacting appropriately within milieu.  A.  Support and encouragement offered,   NP notified of Trazodone effects and medication discontinued.  Medication given as ordered for s/s of withdrawal.   R.  Pt remains safe on unit, will continue to monitor.

## 2013-05-05 NOTE — [facility] Group Notes (Signed)
[facility] Group Notes: (Clinical Social Work)   05/05/2013      Type of Therapy:  Group Therapy   Participation Level:  Did Not Attend    Ambrose Mantle, LCSW 05/05/2013, 1:18 PM

## 2013-05-05 NOTE — Progress Notes (Signed)
Did NOT attend GOALs group 

## 2013-05-05 NOTE — Progress Notes (Signed)
D   Pt reports feeling depressed and hopeless  He contracts for safety   He reports poor sleep and low energy  He said his ability to pay attention is improving and his appetite is improving   He reports minimal signs and symptoms of withdrawal  A   Verbal support given  Medications administered and effectiveness monitored    Q 15 min checks R   Pt safe at present

## 2013-05-05 NOTE — Progress Notes (Signed)
[facility] MD Progress Note  05/05/2013 11:34 AM Derrick [name]  MRN:  [id number] Subjective:  10/10 anxiety--"I have always had and can't get rid of it.  I don't understand why." Depression high with suicidal ideations with a plan to overdose, complains of a headache but did not want to take any acetaminophen because he feels he is taking enough medications.  He did complain of GERD, Protonix ordered.  Denies withdrawal symptoms, sleep poor and appetite "good".   Diagnosis:   DSM5:  Substance/Addictive Disorders:  Alcohol Related Disorder - Moderate (303.90)  Axis I: Anxiety Disorder NOS, Bipolar, Depressed and Substance Abuse Axis II: Deferred Axis III:  Past Medical History  Diagnosis Date  . Bipolar 1 disorder   . Substance abuse   . Anxiety   . Hypertension   . Headache(784.0)    Axis IV: economic problems, other psychosocial or environmental problems, problems related to social environment and problems with primary support group Axis V: 41-50 serious symptoms  ADL's:  Intact  Sleep: Poor  Appetite:  Good  Suicidal Ideation:  Plan:  Overdose Intent:  yes  Means:  none Homicidal Ideation:  Denies  Psychiatric Specialty Exam: Review of Systems  Constitutional: Negative.   HENT: Negative.   Eyes: Negative.   Respiratory: Negative.   Cardiovascular: Negative.   Gastrointestinal: Negative.   Genitourinary: Negative.   Musculoskeletal: Negative.   Skin: Negative.   Neurological: Negative.   Endo/Heme/Allergies: Negative.   Psychiatric/Behavioral: Positive for depression and substance abuse. The patient is nervous/anxious.     Blood pressure 120/66, pulse 73, temperature 97.9 F (36.6 C), temperature source Oral, resp. rate 16, height 6' (1.829 m), weight 82.555 kg (182 lb), SpO2 100.00%.Body mass index is 24.68 kg/(m^2).  General Appearance: Disheveled  Eye Solicitor::  Fair  Speech:  Normal Rate  Volume:  Normal  Mood:  Anxious and Depressed  Affect:  Congruent   Thought Process:  Coherent  Orientation:  Full (Time, Place, and Person)  Thought Content:  WDL  Suicidal Thoughts:  Yes.  with intent/plan  Homicidal Thoughts:  No  Memory:  Immediate;   Fair Recent;   Fair Remote;   Fair  Judgement:  Poor  Insight:  Fair  Psychomotor Activity:  Decreased  Concentration:  Fair  Recall:  Fair  Akathisia:  No  Handed:  Right  AIMS (if indicated):     Assets:  Physical Health Resilience Social Support  Sleep:  Number of Hours: 5.75   Current Medications: Current Facility-Administered Medications  Medication Dose Route Frequency Provider Last Rate Last Dose  . acetaminophen (TYLENOL) tablet 650 mg  650 mg Oral Q6H PRN Audrea Muscat, NP   650 mg at 05/03/13 1313  . alum & mag hydroxide-simeth (MAALOX/MYLANTA) 200-200-20 MG/5ML suspension 30 mL  30 mL Oral Q4H PRN Evanna Janann August, NP      . amitriptyline (ELAVIL) tablet 50 mg  50 mg Oral QHS Rachael Fee, MD   50 mg at 05/04/13 2138  . atomoxetine (STRATTERA) capsule 25 mg  25 mg Oral Daily Rachael Fee, MD   25 mg at 05/05/13 [phone]  . cloNIDine (CATAPRES) tablet 0.1 mg  0.1 mg Oral QID Audrea Muscat, NP   0.1 mg at 05/05/13 1191   Followed by  . cloNIDine (CATAPRES) tablet 0.1 mg  0.1 mg Oral BH-qamhs Evanna Janann August, NP       Followed by  . [START ON 05/08/2013] cloNIDine (CATAPRES) tablet 0.1 mg  0.1 mg  Oral QAC breakfast Evanna Janann August, NP      . dicyclomine (BENTYL) tablet 20 mg  20 mg Oral Q6H PRN Audrea Muscat, NP   20 mg at 05/05/13 0842  . diphenhydrAMINE (BENADRYL) capsule 50 mg  50 mg Oral QHS PRN,MR X 1 Evanna Cori Merry Proud, NP   50 mg at 05/02/13 2338  . hydrOXYzine (ATARAX/VISTARIL) tablet 25 mg  25 mg Oral Q6H PRN Audrea Muscat, NP   25 mg at 05/04/13 2254  . loperamide (IMODIUM) capsule 2-4 mg  2-4 mg Oral PRN Evanna Janann August, NP      . magnesium hydroxide (MILK OF MAGNESIA) suspension 30 mL  30 mL Oral Daily PRN Evanna Janann August, NP      .  methocarbamol (ROBAXIN) tablet 500 mg  500 mg Oral Q8H PRN Evanna Janann August, NP      . naproxen (NAPROSYN) tablet 500 mg  500 mg Oral BID PRN Audrea Muscat, NP   500 mg at 05/03/13 1610  . nicotine (NICODERM CQ - dosed in mg/24 hours) patch 21 mg  21 mg Transdermal Q0600 Rachael Fee, MD   21 mg at 05/05/13 0843  . ondansetron (ZOFRAN-ODT) disintegrating tablet 4 mg  4 mg Oral Q6H PRN Evanna Cori Burkett, NP      . pantoprazole (PROTONIX) EC tablet 40 mg  40 mg Oral BID Nanine Means, NP      . traZODone (DESYREL) tablet 50 mg  50 mg Oral QHS PRN,MR X 1 Evanna Janann August, NP        Lab Results: No results found for this or any previous visit (from the past 48 hour(s)).  Physical Findings: AIMS: Facial and Oral Movements Muscles of Facial Expression: None, normal Lips and Perioral Area: None, normal Jaw: None, normal Tongue: None, normal,Extremity Movements Upper (arms, wrists, hands, fingers): None, normal Lower (legs, knees, ankles, toes): None, normal, Trunk Movements Neck, shoulders, hips: None, normal, Overall Severity Severity of abnormal movements (highest score from questions above): None, normal Incapacitation due to abnormal movements: None, normal Patient's awareness of abnormal movements (rate only patient's report): No Awareness, Dental Status Current problems with teeth and/or dentures?: Yes Does patient usually wear dentures?: No  CIWA:  CIWA-Ar Total: 6 COWS:  COWS Total Score: 1  Treatment Plan Summary: Daily contact with patient to assess and evaluate symptoms and progress in treatment Medication management  Plan:  Review of chart, vital signs, medications, and notes. 1-Individual and group therapy 2-Medication management for depression, substance abuse, and anxiety:  Medications reviewed with the patient and he stated no untoward effects, no changes made because he refused; complained of GERD--Protonix ordered 3-Coping skills for depression, anxiety, and  substance abuse 4-Continue crisis stabilization and management 5-Address health issues--monitoring vital signs, stable 6-Treatment plan in progress to prevent relapse of depression, substance abuse,and anxiety  Medical Decision Making Problem Points:  Established problem, stable/improving (1) and Review of psycho-social stressors (1) Data Points:  Review of new medications or change in dosage (2)  I certify that inpatient services furnished can reasonably be expected to improve the patient's condition.   Nanine Means, PMH-NP 05/05/2013, 11:34 AM  Reviewed note, agree with findings and plan.  Jacqulyn Cane, M.D.  05/05/2013 11:26 PM

## 2013-05-06 DIAGNOSIS — F411 Generalized anxiety disorder: Secondary | ICD-10-CM

## 2013-05-06 DIAGNOSIS — F332 Major depressive disorder, recurrent severe without psychotic features: Secondary | ICD-10-CM

## 2013-05-06 MED ORDER — GABAPENTIN 300 MG PO CAPS
300.0000 mg | ORAL_CAPSULE | Freq: Three times a day (TID) | ORAL | Status: DC
  Administered 2013-05-06: 300 mg via ORAL
  Filled 2013-05-06 (×3): qty 1

## 2013-05-06 MED ORDER — RISPERIDONE 0.25 MG PO TABS
0.2500 mg | ORAL_TABLET | Freq: Two times a day (BID) | ORAL | Status: DC
  Administered 2013-05-07 (×2): 0.25 mg via ORAL
  Filled 2013-05-06 (×8): qty 1

## 2013-05-06 MED ORDER — RISPERIDONE 0.5 MG PO TABS
0.5000 mg | ORAL_TABLET | Freq: Every day | ORAL | Status: DC
  Administered 2013-05-06: 0.5 mg via ORAL
  Filled 2013-05-06 (×5): qty 1

## 2013-05-06 MED ORDER — RISPERIDONE 0.5 MG PO TABS
0.5000 mg | ORAL_TABLET | Freq: Once | ORAL | Status: AC
  Administered 2013-05-06: 0.5 mg via ORAL
  Filled 2013-05-06 (×2): qty 1

## 2013-05-06 MED ORDER — GABAPENTIN 100 MG PO CAPS
100.0000 mg | ORAL_CAPSULE | Freq: Three times a day (TID) | ORAL | Status: DC
  Filled 2013-05-06 (×4): qty 1

## 2013-05-06 NOTE — Progress Notes (Signed)
Psychoeducational Group Note  Date:  05/06/2013 Time:  0945 am  Group Topic/Focus:  Making Healthy Choices:   The focus of this group is to help patients identify negative/unhealthy choices they were using prior to admission and identify positive/healthier coping strategies to replace them upon discharge.  Participation Level:  Active  Participation Quality:  Appropriate and Attentive  Affect:  Appropriate  Cognitive:  Alert and Appropriate  Insight:  Developing/Improving  Engagement in Group:  Developing/Improving  Additional Comments:    Nia Nathaniel J 05/06/2013, 10:29 AM 

## 2013-05-06 NOTE — Progress Notes (Signed)
Psychoeducational Group Note  Date:  05/06/2013 Time:  0945 am  Group Topic/Focus:  Making Healthy Choices:   The focus of this group is to help patients identify negative/unhealthy choices they were using prior to admission and identify positive/healthier coping strategies to replace them upon discharge.  Participation Level:  Did Not Attend  [name] J 05/06/2013, 10:29 AM 

## 2013-05-06 NOTE — Progress Notes (Signed)
D   Pt reports feeling depressed and hopeless  He contracts for safety   He reports poor sleep and low energy  He said his ability to pay attention is improving and his appetite is improving   He reports minimal signs and symptoms of withdrawal   Pt does say that sundays are hard for him and so far he has isolated to his room A   Verbal support given  Medications administered and effectiveness monitored    Q 15 min checks   Will continue to encourage socialization R   Pt safe at present

## 2013-05-06 NOTE — Progress Notes (Signed)
D.  Pt pleasant on approach, positive for evening AA group.  Interacting appropriately within milieu.  Pt states that he would like Neurontin that was ordered today to be discontinued because it gives him bad side effects such as constipation.  Denies SI/HI/hallucinations at this time.  A.  Support and encouragement offered, assured Pt that side effect information would be passed on to doctor.  R.  Pt remains safe on unit, will continue to monitor.

## 2013-05-06 NOTE — Progress Notes (Signed)
[facility] MD Progress Note  05/06/2013 3:13 PM Derrick [name]  MRN:  [id number] Subjective:  Derrick [name] was lying in bed this afternoon. He reports he has been having heavy anxiety, and even though it causes migraines, he has been asking for Vistaril to relieve his anxiety. He reports that his anxiety is a 10 on a scale of 1-10 where 10 is the worst. He also reports that he is having a significant amount of depression, and rates it as a 9 on a scale of 1-10. He endorses suicidal thoughts, but he denies any plan or intention. He denies any homicidal thoughts. He also endorses some auditory hallucinations, and thought there was a nurse in his room earlier today, but when he turned to look there was no one there. He reports that his sleep is okay, but he wakes a lot. He endorses a good appetite. He feels that the Wilhemena Durie is helping him to slow down some. He reports that he has been on Neurontin in the past, and feels that it caused him constipation. He is willing to try again.  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 Bipolar 1 disorder   . Substance abuse   . Anxiety   . Hypertension   . Headache(784.0)     ADL's:  Intact  Sleep: Good  Appetite:  Good  Suicidal Ideation:  Patient endorses suicidal thoughts, but denies any plan or intent Homicidal Ideation:  Patient denies any thought, plan, or intent AEB (as evidenced by):  Psychiatric Specialty Exam: Review of Systems  Constitutional: Negative.   HENT: Negative.   Eyes: Negative.   Respiratory: Negative.   Cardiovascular: Negative.   Gastrointestinal: Negative.   Genitourinary: Negative.   Musculoskeletal:  Negative.   Skin: Negative.   Neurological: Negative.   Endo/Heme/Allergies: Negative.   Psychiatric/Behavioral: Positive for depression, suicidal ideas, hallucinations and substance abuse. The patient is nervous/anxious. The patient does not have insomnia.     Blood pressure 123/79, pulse 60, temperature 97 F (36.1 C), temperature source Oral, resp. rate 18, height 6' (1.829 m), weight 82.555 kg (182 lb), SpO2 100.00%.Body mass index is 24.68 kg/(m^2).  General Appearance: Casual and Fairly Groomed  Patent attorney::  Good  Speech:  Clear and Coherent  Volume:  Normal  Mood:  Anxious and Depressed  Affect:  Congruent  Thought Process:  Disorganized  Orientation:  Full (Time, Place, and Person)  Thought Content:  Hallucinations: Auditory and Rumination  Suicidal Thoughts:  Yes.  without intent/plan  Homicidal Thoughts:  No  Memory:  Immediate;   Good Recent;   Good Remote;   Good  Judgement:  Fair  Insight:  Fair  Psychomotor Activity:  Normal  Concentration:  Good  Recall:  Good  Akathisia:  No  Handed:  Right  AIMS (if indicated):     Assets:  Communication Skills Desire for Improvement  Sleep:  Number of Hours: 5   Current Medications: Current Facility-Administered Medications  Medication Dose Route Frequency Provider Last Rate Last Dose  . acetaminophen (TYLENOL) tablet 650 mg  650 mg Oral Q6H PRN Audrea Muscat, NP   650 mg at 05/03/13 1313  . alum & mag hydroxide-simeth (MAALOX/MYLANTA) 200-200-20 MG/5ML suspension 30 mL  30 mL  Oral Q4H PRN Audrea Muscat, NP      . amitriptyline (ELAVIL) tablet 50 mg  50 mg Oral QHS Rachael Fee, MD   50 mg at 05/05/13 2141  . atomoxetine (STRATTERA) capsule 25 mg  25 mg Oral Daily Rachael Fee, MD   25 mg at 05/06/13 0825  . cloNIDine (CATAPRES) tablet 0.1 mg  0.1 mg Oral BH-qamhs Evanna Cori Merry Proud, NP   0.1 mg at 05/06/13 0825   Followed by  . [START ON 05/08/2013] cloNIDine (CATAPRES) tablet 0.1 mg  0.1 mg Oral QAC  breakfast Evanna Cori Burkett, NP      . dicyclomine (BENTYL) tablet 20 mg  20 mg Oral Q6H PRN Audrea Muscat, NP   20 mg at 05/05/13 0842  . diphenhydrAMINE (BENADRYL) capsule 50 mg  50 mg Oral QHS PRN,MR X 1 Evanna Cori Merry Proud, NP   50 mg at 05/02/13 2338  . hydrOXYzine (ATARAX/VISTARIL) tablet 25 mg  25 mg Oral Q6H PRN Audrea Muscat, NP   25 mg at 05/06/13 1414  . loperamide (IMODIUM) capsule 2-4 mg  2-4 mg Oral PRN Evanna Janann August, NP      . magnesium hydroxide (MILK OF MAGNESIA) suspension 30 mL  30 mL Oral Daily PRN Evanna Janann August, NP      . methocarbamol (ROBAXIN) tablet 500 mg  500 mg Oral Q8H PRN Audrea Muscat, NP   500 mg at 05/06/13 1414  . naproxen (NAPROSYN) tablet 500 mg  500 mg Oral BID PRN Audrea Muscat, NP   500 mg at 05/06/13 1414  . nicotine (NICODERM CQ - dosed in mg/24 hours) patch 21 mg  21 mg Transdermal Q0600 Rachael Fee, MD   21 mg at 05/06/13 0610  . ondansetron (ZOFRAN-ODT) disintegrating tablet 4 mg  4 mg Oral Q6H PRN Evanna Janann August, NP      . pantoprazole (PROTONIX) EC tablet 40 mg  40 mg Oral BID Nanine Means, NP        Lab Results: No results found for this or any previous visit (from the past 48 hour(s)).  Physical Findings: AIMS: Facial and Oral Movements Muscles of Facial Expression: None, normal Lips and Perioral Area: None, normal Jaw: None, normal Tongue: None, normal,Extremity Movements Upper (arms, wrists, hands, fingers): None, normal Lower (legs, knees, ankles, toes): None, normal, Trunk Movements Neck, shoulders, hips: None, normal, Overall Severity Severity of abnormal movements (highest score from questions above): None, normal Incapacitation due to abnormal movements: None, normal Patient's awareness of abnormal movements (rate only patient's report): No Awareness, Dental Status Current problems with teeth and/or dentures?: Yes Does patient usually wear dentures?: No  CIWA:  CIWA-Ar Total: 4 COWS:  COWS  Total Score: 4  Treatment Plan Summary: Daily contact with patient to assess and evaluate symptoms and progress in treatment Medication management  Plan: Review of chart, vital signs, medications, and notes.  1-Individual and group therapy  2-Medication management for depression and anxiety. Initiate Neurontin 300 mg 3 times daily, Risperdal 0.5 mg at bedtime and 0.25 mg twice daily. 3-Coping skills for depression, anxiety, and substance abuse  4-Continue crisis stabilization and management  5-Address health issues--monitoring vital signs, stable  6-Treatment plan in progress to prevent relapse of depression and anxiety   Medical Decision Making Problem Points:  Established problem, stable/improving (1) and Review of psycho-social stressors (1) Data Points:  Review or order clinical lab tests (1) Review of medication regiment & side effects (2) Review  of new medications or change in dosage (2)  I certify that inpatient services furnished can reasonably be expected to improve the patient's condition.   WATT,ALAN 05/06/2013, 3:13 PM  Reviewed note, agree with findings and plan, with the following exceptions- will start neurontin at 100 mg TID and titrate to 300 mg TID if needed.  Jacqulyn Cane, M.D.  05/06/2013 8:34 PM

## 2013-05-06 NOTE — Progress Notes (Signed)
[facility] Group Notes:  (Nursing/MHT/Case Management/Adjunct)  Date:  05/05/13 Time:  2100  Type of Therapy:  wrap up group  Participation Level:  Active  Participation Quality:  Appropriate, Attentive, Sharing and Supportive  Affect:  Appropriate  Cognitive:  Appropriate  Insight:  Improving  Engagement in Group:  Engaged  Modes of Intervention:  Clarification, Education and Support  Summary of Progress/Problems: Pt reports feeling better today after one of his medicines was changed due to racing heart feeling. Pt shares that he has been making dumb decisions and wants to go anywhere at discharge but jail. Pt hopes to go to [facility]. Shelah Lewandowsky 05/06/2013, 1:26 AM

## 2013-05-06 NOTE — [facility] Group Notes (Signed)
[facility] Group Notes: (Clinical Social Work)   05/06/2013      Type of Therapy:  Group Therapy   Participation Level:  Did Not Attend    Ambrose Mantle, LCSW 05/06/2013, 12:40 PM

## 2013-05-07 MED ORDER — AMITRIPTYLINE HCL 75 MG PO TABS
75.0000 mg | ORAL_TABLET | Freq: Every day | ORAL | Status: DC
  Administered 2013-05-07 – 2013-05-09 (×3): 75 mg via ORAL
  Filled 2013-05-07 (×5): qty 1

## 2013-05-07 NOTE — Progress Notes (Signed)
Patient ID: Derrick [name], male   DOB: 08/02/1986, 27 y.o.   MRN: [id number] He has been up and to part of the groups has had poor interaction with peers and staff. Has been in bed part of this afternoon feels that the medication is making him sleepy.

## 2013-05-07 NOTE — Progress Notes (Signed)
Patient did attend the evening speaker AA meeting.  

## 2013-05-07 NOTE — Progress Notes (Signed)
Pt observed in the dayroom watching TV.  Pt reports that he is having a strange sensation in his tongue as if it is swollen.  He says it is not painful and he is not having any difficulty breathing.  Pt states he started Risperdal this morning and thinks it may be an allergic reaction.  Discussed with pt some of the side affects.  Pt prefers to not take his dose tonight and discuss this with the MD in the morning.  Pt will be given his hs dose of Benadryl 50mg  as a precaution.  Pt denies SI/HI/AV at this time.  He denies any withdrawal symptoms.  He says his anxiety is up at this time.  Pt states he has been going to groups and participating in his treatment plan.  He says his grandmother has been calling some treatment centers to find him a place to go for extended treatment.  He says if he can't find any other place, he will go to [facility].  Pt is pleasant/cooperative in conversation.  Pt makes his needs known to staff.  Support and encouragement offered.  Safety maintained with q15 minute checks.

## 2013-05-07 NOTE — BHH Group Notes (Signed)
[facility] LCSW Aftercare Discharge Planning Group Note   05/07/2013 12:40 PM  Participation Quality:  Appropriate   Mood/Affect:  Blunted  Depression Rating:  9  Anxiety Rating:  8  Thoughts of Suicide:  No Will you contract for safety?   N/A  Current AVH:  No  Plan for Discharge/Comments: Pt reports that he had been going to [facility] for med management and is interested in anger management classes through either [facility] or [facility]. Pt referral made to [facility] on Friday. Pt reports that he lives in [place] with his grandmother currently.   Transportation Means: unknown at this time.   Supports: grandmother/some family contacts.   Smart, [employer]

## 2013-05-07 NOTE — BHH Group Notes (Signed)
[facility] LCSW Group Therapy  05/07/2013 4:47 PM  Type of Therapy:  Group Therapy  Participation Level:  Did Not Attend  Derrick [name] 05/07/2013, 4:47 PM

## 2013-05-07 NOTE — Progress Notes (Signed)
[facility] MD Progress Note  05/07/2013 4:18 PM KHAYMAN KIRSCH  MRN:  [id number] Subjective:  Derrick [name] is trying to figure things out. He is still trying to get himself together and not run into any more of these situations. He feels that the Risperdal is helping with the ruminative thinking. He is not sleeping as well. He is grateful that his grandmother is trying to help him. He would like to go to rehab after here as he wants to be sure he addresses what is going on with him for good.  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   HENT: Negative.   Eyes: Negative.   Respiratory: Negative.   Cardiovascular: Negative.   Gastrointestinal: Negative.   Genitourinary: Negative.   Musculoskeletal: Negative.   Skin: Negative.   Endo/Heme/Allergies: Negative.   Psychiatric/Behavioral: Positive for depression and substance abuse. The patient is nervous/anxious and has insomnia.     Blood pressure 114/77, pulse 80, temperature 97.3 F (36.3 C), temperature source Oral, resp. rate 16, height 6' (1.829 m), weight 82.555 kg (182 lb), SpO2 100.00%.Body mass index is 24.68 kg/(m^2).  General Appearance: Fairly Groomed  Patent attorney::  Fair  Speech:  Clear and Coherent  Volume:  Decreased  Mood:  Anxious, Depressed and worried  Affect:  Restricted  Thought Process:  Coherent and Goal Directed   Orientation:  Full (Time, Place, and Person)  Thought Content:  symptoms, worries, concerns  Suicidal Thoughts:  No  Homicidal Thoughts:  No  Memory:  Immediate;   Fair Recent;   Fair Remote;   Fair  Judgement:  Fair  Insight:  Present and superficial  Psychomotor Activity:  Restlessness  Concentration:  Fair  Recall:  Fair  Akathisia:  No  Handed:    AIMS (if indicated):     Assets:  Desire for Improvement Social Support  Sleep:  Number of Hours: 5.25   Current Medications: Current Facility-Administered Medications  Medication Dose Route Frequency Provider Last Rate Last Dose  . acetaminophen (TYLENOL) tablet 650 mg  650 mg Oral Q6H PRN Audrea Muscat, NP   650 mg at 05/03/13 1313  . alum & mag hydroxide-simeth (MAALOX/MYLANTA) 200-200-20 MG/5ML suspension 30 mL  30 mL Oral Q4H PRN Evanna Janann August, NP      . amitriptyline (ELAVIL) tablet 75 mg  75 mg Oral QHS Rachael Fee, MD      . atomoxetine (STRATTERA) capsule 25 mg  25 mg Oral Daily Rachael Fee, MD   25 mg at 05/07/13 0843  . cloNIDine (CATAPRES) tablet 0.1 mg  0.1 mg Oral BH-qamhs Evanna Cori Merry Proud, NP   0.1 mg at 05/06/13 2123   Followed by  . [START ON 05/08/2013] cloNIDine (CATAPRES) tablet 0.1 mg  0.1 mg Oral QAC breakfast Evanna Cori Burkett, NP      . dicyclomine (BENTYL) tablet 20 mg  20 mg Oral Q6H PRN Evanna Janann August, NP   20 mg at  05/05/13 2141  . diphenhydrAMINE (BENADRYL) capsule 50 mg  50 mg Oral QHS PRN,MR X 1 Evanna Cori Merry Proud, NP   50 mg at 05/06/13 2123  . hydrOXYzine (ATARAX/VISTARIL) tablet 25 mg  25 mg Oral Q6H PRN Audrea Muscat, NP   25 mg at 05/06/13 1414  . loperamide (IMODIUM) capsule 2-4 mg  2-4 mg Oral PRN Evanna Janann August, NP      . magnesium hydroxide (MILK OF MAGNESIA) suspension 30 mL  30 mL Oral Daily PRN Evanna Janann August, NP      . methocarbamol (ROBAXIN) tablet 500 mg  500 mg Oral Q8H PRN Audrea Muscat, NP   500 mg at 05/07/13 1559  . naproxen (NAPROSYN)  tablet 500 mg  500 mg Oral BID PRN Audrea Muscat, NP   500 mg at 05/07/13 1559  . nicotine (NICODERM CQ - dosed in mg/24 hours) patch 21 mg  21 mg Transdermal Q0600 Rachael Fee, MD   21 mg at 05/07/13 9811  . ondansetron (ZOFRAN-ODT) disintegrating tablet 4 mg  4 mg Oral Q6H PRN Evanna Janann August, NP      . pantoprazole (PROTONIX) EC tablet 40 mg  40 mg Oral BID Nanine Means, NP   40 mg at 05/07/13 0842  . risperiDONE (RISPERDAL) tablet 0.25 mg  0.25 mg Oral BID AC Jorje Guild, PA-C   0.25 mg at 05/07/13 0602  . risperiDONE (RISPERDAL) tablet 0.5 mg  0.5 mg Oral QHS Jorje Guild, PA-C   0.5 mg at 05/06/13 2122    Lab Results: No results found for this or any previous visit (from the past 48 hour(s)).  Physical Findings: AIMS: Facial and Oral Movements Muscles of Facial Expression: None, normal Lips and Perioral Area: None, normal Jaw: None, normal Tongue: None, normal,Extremity Movements Upper (arms, wrists, hands, fingers): None, normal Lower (legs, knees, ankles, toes): None, normal, Trunk Movements Neck, shoulders, hips: None, normal, Overall Severity Severity of abnormal movements (highest score from questions above): None, normal Incapacitation due to abnormal movements: None, normal Patient's awareness of abnormal movements (rate only patient's report): No Awareness, Dental Status Current problems with teeth and/or dentures?: Yes Does patient usually wear dentures?: No  CIWA:  CIWA-Ar Total: 5 COWS:  COWS Total Score: 1  Treatment Plan Summary: Daily contact with patient to assess and evaluate symptoms and progress in treatment Medication management  Plan: Supportive approach/coping skills/relapse prevention           Increase the Elavil to 75 mg HS             Medical Decision Making Problem Points:  Review of psycho-social stressors (1) Data Points:  Review of medication regiment & side effects (2)  I certify that inpatient services furnished can reasonably be  expected to improve the patient's condition.   Azarria Balint A 05/07/2013, 4:18 PM

## 2013-05-08 MED ORDER — HYDROXYZINE HCL 25 MG PO TABS
25.0000 mg | ORAL_TABLET | Freq: Three times a day (TID) | ORAL | Status: DC
  Administered 2013-05-08 – 2013-05-10 (×5): 25 mg via ORAL
  Filled 2013-05-08 (×11): qty 1

## 2013-05-08 MED ORDER — ZIPRASIDONE HCL 20 MG PO CAPS
20.0000 mg | ORAL_CAPSULE | Freq: Every day | ORAL | Status: DC
  Administered 2013-05-08 – 2013-05-10 (×3): 20 mg via ORAL
  Filled 2013-05-08 (×5): qty 1

## 2013-05-08 MED ORDER — ZIPRASIDONE HCL 40 MG PO CAPS
40.0000 mg | ORAL_CAPSULE | Freq: Every day | ORAL | Status: DC
  Administered 2013-05-08 – 2013-05-09 (×2): 40 mg via ORAL
  Filled 2013-05-08 (×5): qty 1

## 2013-05-08 NOTE — Progress Notes (Signed)
Patient ID: Derrick [name], male   DOB: 1986-03-19, 27 y.o.   MRN: [id number] He has been in bed most of day. Stated that he just slept a little bit last night every though he had had sleep medication.  Risperdal was discontinued and the swelling of his tongue is gone.

## 2013-05-08 NOTE — [facility] Group Notes (Signed)
[facility] LCSW Group Therapy  05/08/2013 1:15 PM  Type of Therapy:  Group Therapy  Participation Level:  Active  Participation Quality:  Attentive  Affect:  Appropriate  Cognitive:  Alert  Insight:  Engaged  Engagement in Therapy:  Engaged  Modes of Intervention:  Discussion, Education and Support  Summary of Progress/Problems: [facility] Speaker came to talk about his personal journey with substance abuse and addiction. The pt processed ways by which to relate to the speaker. [facility] speaker provided handouts and educational information pertaining to groups and services offered by the [facility]. Pt was attentive and engaged throughout today's group.    Smart, Heather 05/08/2013, 3:34 PM

## 2013-05-08 NOTE — Progress Notes (Signed)
The focus of this group is to educate the patient on the purpose and policies of crisis stabilization and provide a format to answer questions about their admission.  The group details unit policies and expectations of patients while admitted.  Patient came to the group initially, but got up and left as soon as attendance was done.  He set his daily workbook down prior to walking out.  He did not return to the group.

## 2013-05-08 NOTE — Progress Notes (Signed)
Recreation Therapy Notes  Date: 09.15.2014 Time: 3:00pm Location: [address] Dayroom  Group Topic: Coping Skills  Goal Area(s) Addresses:  Patient will verbalize importance of recognizing emotions. Patient will identify at least one emotion. Patient will successfully represent varying emotions in pictures or words.   Behavioral Response: Engaged, Attentive, Anxious, Receptive, Tangential   Intervention: Art  Activity: Emotion Wheel. As a group patients identified 8 emotions they associate with recovery. Using the provided worksheet patients were asked to represent emotions identified by group in pictures of words.    Education: Emotional Recognition, Emotional Regulation, Coping Skills  Education Outcome: Acknowledges Understanding & In group clarification offered.    Clinical Observations/Feedback: Patient actively participated in group session, identifying and defining emotions with group members. Emotions ranged from anxious to happy and frustrated, as well as grace and humility. Patient used faces to represent each emotion. Patient was unable to identify a coping mechanism he can use when he feels out of control. Patient shared that he has anger issues and he needs help controlling his anger because when he gets angry he blacks out. Patient was receptive to concept that he has triggers that he is becoming that angry that he is unable to recognize. Additionally patient was receptive to getting additional outpatient help or long term mental health treatment so he can control his anger. Patient expressed apprehension and fear around the amount of work this will take, however patient stated he has a strong desire to continue his treatment so he can better deal with his anger.   Patient additionally stated he was feeling very anxious because he is waiting on a bed at [facility] and does not know if he will be able to get in there. LRT encouraged patient to work with LCSW and use deep breathing,  exercise, such as push-ups, sit-ups or wall sits, or drawing to relieve some of his anxiety. Patient receptive to all options, but did not seem confident in his ability to put these options to use.   Patient showed signs of anxeity during session - bouncing leg up and down, rigid body movements. Patient additionally presented with pressured hyper-verbal speech. Patient spoke about his anger in a tangential fashion.   Marykay Lex Callin Ashe, LRT/CTRS  Nathan Stallworth L 05/08/2013 5:09 PM

## 2013-05-08 NOTE — Progress Notes (Signed)
Adult Psychoeducational Group Note  Date:  05/08/2013 Time:  1:53 PM  Group Topic/Focus:  Recovery Goals:   The focus of this group is to identify appropriate goals for recovery and establish a plan to achieve them.  Participation Level:  Did Not Attend  Derrick [name] 05/08/2013, 1:53 PM

## 2013-05-08 NOTE — Progress Notes (Signed)
Patient ID: Derrick [name], male   DOB: Aug 01, 1986, 27 y.o.   MRN: [id number] [facility] MD Progress Note  05/08/2013 2:27 PM [name]  MRN:  [id number]  Subjective:  Derrick [name] is complaining that he is not doing well. Says his tongue got swollen last night after he took Risperal. Does not want Risperdal. Says he needs to learn how to control his anger issues, short temper and how to exercise some patience. Does not want to have to lose his temper any more. [place] continues to endorse high anxiety levels. He admits feeling suicidal off and on without plans and or intent. Continue to report not being able to sleep at night. States his high anxiety times start around 41 Noon through around 09:00 pm.  Diagnosis:   DSM5: Schizophrenia Disorders:   Obsessive-Compulsive Disorders:  Compulsive behaviors Trauma-Stressor Disorders:   Substance/Addictive Disorders:  Cannabis Use Disorder - Moderate 9304.30), Cocaine use Disorder, Benzodiazepine Use Disorder Depressive Disorders:  Major Depressive Disorder - Severe (296.23)  Axis I: Mood Disorder NOS and Impulse Control NOS  ADL's:  Intact  Sleep: Poor  Appetite:  Fair  Suicidal Ideation:  Plan:  denies Intent:  denies Means:  denies  Homicidal Ideation:  Plan:  denies Intent:  denies Means:  denies  AEB (as evidenced by):  Psychiatric Specialty Exam: Review of Systems  Constitutional: Negative.   HENT: Negative.   Eyes: Negative.   Respiratory: Negative.   Cardiovascular: Negative.   Gastrointestinal: Negative.   Genitourinary: Negative.   Musculoskeletal: Negative.   Skin: Negative.   Endo/Heme/Allergies: Negative.   Psychiatric/Behavioral: Positive for depression and substance abuse. The patient is nervous/anxious and has insomnia.     Blood pressure 107/75, pulse 74, temperature 97.6 F (36.4 C), temperature source Oral, resp. rate 18, height 6' (1.829 m), weight 82.555 kg (182 lb), SpO2 100.00%.Body mass index is 24.68  kg/(m^2).  General Appearance: Fairly Groomed  Patent attorney::  Fair  Speech:  Clear and Coherent  Volume:  Decreased  Mood:  Anxious, Depressed and worried  Affect:  Restricted  Thought Process:  Coherent and Goal Directed  Orientation:  Full (Time, Place, and Person)  Thought Content:  symptoms, worries, concerns  Suicidal Thoughts:  No  Homicidal Thoughts:  No  Memory:  Immediate;   Fair Recent;   Fair Remote;   Fair  Judgement:  Fair  Insight:  Present and superficial  Psychomotor Activity:  Restlessness  Concentration:  Fair  Recall:  Fair  Akathisia:  No  Handed:    AIMS (if indicated):     Assets:  Desire for Improvement Social Support  Sleep:  Number of Hours: 6   Current Medications: Current Facility-Administered Medications  Medication Dose Route Frequency Provider Last Rate Last Dose  . acetaminophen (TYLENOL) tablet 650 mg  650 mg Oral Q6H PRN Audrea Muscat, NP   650 mg at 05/03/13 1313  . alum & mag hydroxide-simeth (MAALOX/MYLANTA) 200-200-20 MG/5ML suspension 30 mL  30 mL Oral Q4H PRN Evanna Janann August, NP      . amitriptyline (ELAVIL) tablet 75 mg  75 mg Oral QHS Rachael Fee, MD   75 mg at 05/07/13 2125  . atomoxetine (STRATTERA) capsule 25 mg  25 mg Oral Daily Rachael Fee, MD   25 mg at 05/08/13 0819  . cloNIDine (CATAPRES) tablet 0.1 mg  0.1 mg Oral QAC breakfast Audrea Muscat, NP   0.1 mg at 05/08/13 0650  . diphenhydrAMINE (BENADRYL) capsule 50 mg  50 mg Oral QHS PRN,MR X 1 Evanna Cori Merry Proud, NP   50 mg at 05/07/13 2222  . magnesium hydroxide (MILK OF MAGNESIA) suspension 30 mL  30 mL Oral Daily PRN Evanna Cori Burkett, NP      . nicotine (NICODERM CQ - dosed in mg/24 hours) patch 21 mg  21 mg Transdermal Q0600 Rachael Fee, MD   21 mg at 05/08/13 [phone]  . pantoprazole (PROTONIX) EC tablet 40 mg  40 mg Oral BID Nanine Means, NP   40 mg at 05/08/13 9604    Lab Results: No results found for this or any previous visit (from the past 48  hour(s)).  Physical Findings: AIMS: Facial and Oral Movements Muscles of Facial Expression: None, normal Lips and Perioral Area: None, normal Jaw: None, normal Tongue: None, normal,Extremity Movements Upper (arms, wrists, hands, fingers): None, normal Lower (legs, knees, ankles, toes): None, normal, Trunk Movements Neck, shoulders, hips: None, normal, Overall Severity Severity of abnormal movements (highest score from questions above): None, normal Incapacitation due to abnormal movements: None, normal Patient's awareness of abnormal movements (rate only patient's report): No Awareness, Dental Status Current problems with teeth and/or dentures?: Yes Does patient usually wear dentures?: No  CIWA:  CIWA-Ar Total: 0 COWS:  COWS Total Score: 1  Treatment Plan Summary: Daily contact with patient to assess and evaluate symptoms and progress in treatment Medication management  Plan: Supportive approach/coping skills/relapse prevention Continue Elavil to 75 mg HS. Initiate hydroxyzine 25 mg tid for anxiety. Add Geodon 20 mg at 12 Noon and 40 mg at 10 PM for mood control/anxiety. Recommended CBT.            Medical Decision Making Problem Points:  Review of psycho-social stressors (1) Data Points:  Review of medication regiment & side effects (2)  I certify that inpatient services furnished can reasonably be expected to improve the patient's condition.   Armandina Stammer I, PMHNP-BC 05/08/2013, 2:27 PM

## 2013-05-08 NOTE — Progress Notes (Signed)
Recreation Therapy Notes  Date: 09.16.2014 Time: 2:30pm Location: [address] Dayroom  Group Topic: Animal Assisted Activities  Behavioral Response: Engaged, Appropriate  Affect: Flat, Anxious  Clinical Observations/Feedback: Dog Team: [employer] Patient interacted appropriately with peer, dog team, LRT and MHT.   Marykay Lex Mertie Haslem, LRT/CTRS  Ikia Cincotta L 05/08/2013 4:40 PM

## 2013-05-08 NOTE — Progress Notes (Addendum)
Pt. Denied pain and discomfort.  Pt. Denies SI and HI.  Pt. Is very animated and had to request pt. Come to the med window 3 times before he came to take his medication.  He was on the phone and interacting with his peers.  Pt. Stated that the risperdal made his tongue swell last night and he is hoping that it does not happen again.  Pt. States that he has never taken geodon either.  Will monitor pt. And continue 15 min cks. To ensure  safety .  Pt. Rates his depression an 8 and his anxiety a 7.  Pt. Already feels the geodon helped him and also states one of the groups today really helped him because he related to the speaker.  Pt. Is hoping to discharge to [facility].

## 2013-05-09 DIAGNOSIS — F1994 Other psychoactive substance use, unspecified with psychoactive substance-induced mood disorder: Secondary | ICD-10-CM

## 2013-05-09 NOTE — Progress Notes (Signed)
Writer received a call from [facility] complaining that she is getting a call from "[employer]" and she has a "50-B" on him. Mis Shumaker stated that the pt is "calling from [phone]" calling for her daughter Christophe Louis. Writer said that the pt "has called my house 4-5 times between 1p-2p. Writer called Kendell Bane, Press photographer on the adult unit and forwarded the message. Writer did not confirm or deny the pt's presence here.Denice Bors, [facility] 05/09/2013 5:25 PM

## 2013-05-09 NOTE — [facility] Group Notes (Signed)
[facility] LCSW Group Therapy  05/09/2013  1:15 PM   Type of Therapy:  Group Therapy  Participation Level:  Active  Participation Quality:  Appropriate and Attentive  Affect:  Appropriate, Anxious  Cognitive:  Alert and Appropriate  Insight:  Developing/Improving and Engaged  Engagement in Therapy:  Developing/Improving and Engaged  Modes of Intervention:  Clarification, Confrontation, Discussion, Education, Exploration, Limit-setting, Orientation, Problem-solving, Rapport Building, Dance movement psychotherapist, Socialization and Support  Summary of Progress/Problems: The topic for group today was emotional regulation.  This group focused on both positive and negative emotion identification and allowed group members to process ways to identify feelings, regulate negative emotions, and find healthy ways to manage internal/external emotions. Group members were asked to reflect on a time when their reaction to an emotion led to a negative outcome and explored how alternative responses using emotion regulation would have benefited them. Group members were also asked to discuss a time when emotion regulation was utilized when a negative emotion was experienced.  Pt shared that he masks every emotion and shows anger, ash this is easier for him and he is used to it.  Pt shared the example of stabbing himself and taking xanax, to make the other person upset.  Pt showed interest in this topic, stating that he knows he needs to learn how to better manage his emotions.  Pt was able to process how this impacts his life negatively and was able to start thinking of ways to regulate his emotions.  Pt actively participated and was engaged in group discussion.  Pt requested reading material on this topic after group.    Derrick [name], LCSWA 05/09/2013 2:19 PM

## 2013-05-09 NOTE — Progress Notes (Signed)
Patient ID: Derrick [name], male   DOB: [birth year], 27 y.o.   MRN: [id number] Pt attended the very end of group.

## 2013-05-09 NOTE — [facility] Group Notes (Signed)
[facility] LCSW Aftercare Discharge Planning Group Note   05/09/2013 8:45 AM  Participation Quality:  Did Not Attend  Reyes Ivan, LCSWA 05/09/2013 9:40 AM

## 2013-05-09 NOTE — Progress Notes (Signed)
Passenger transport manager Derrick [name] spoke with pt. Regarding calls he was making to a Person by the name of Pam who is the daughter of Nelli who has a 50 B against the pt.  Pt. Reports that he was trying to reach the daughter and  The daughter had instructed him to call that number.  Pt.  stated that he would not call there again.

## 2013-05-09 NOTE — Progress Notes (Signed)
[facility] MD Progress Note  05/09/2013 3:51 PM Derrick [name]  MRN:  [id number] Subjective:  States he slept better with the medications last night. He still has some issues with  mood instability, impulsivity. He met with the psychologist today and she help him start looking at different strategies to address the stress, impulsivity, dysfunctional behaviors, negative self perceptions. He states he is committed to make this work. Does not want to go back wards. Still concerned about relapsing. Thinks that vistaril might be causing the tongue swealling Diagnosis:   DSM5: Schizophrenia Disorders:   Obsessive-Compulsive Disorders:   Trauma-Stressor Disorders:   Substance/Addictive Disorders:  Cannabis related disorders  Cocaine Related Disorder Depressive Disorders:  Major Depressive Disorder - Moderate (296.22)  Axis I: Mood Disorder NOS and Substance Induced Mood Disorder  ADL's:  Intact  Sleep: Fair  Appetite:  Fair  Suicidal Ideation:  Plan:  denies Intent:  denies Means:  denies Homicidal Ideation:  Plan:  denies Intent:  denies Means:  denies AEB (as evidenced by):  Psychiatric Specialty Exam: Review of Systems  Constitutional: Negative.   HENT: Negative.   Eyes: Negative.   Respiratory: Negative.   Cardiovascular: Negative.   Gastrointestinal: Negative.   Genitourinary: Negative.   Musculoskeletal: Negative.   Skin: Negative.   Neurological: Negative.   Endo/Heme/Allergies: Negative.   Psychiatric/Behavioral: Positive for depression and substance abuse. The patient is nervous/anxious and has insomnia.     Blood pressure 121/78, pulse 73, temperature 97.6 F (36.4 C), temperature source Oral, resp. rate 20, height 6' (1.829 m), weight 82.555 kg (182 lb), SpO2 100.00%.Body mass index is 24.68 kg/(m^2).  General Appearance: Fairly Groomed  Patent attorney::  Fair  Speech:  Clear and Coherent  Volume:  Decreased  Mood:  Anxious and worried  Affect:  anxious, worried   Thought Process:  Coherent and Goal Directed  Orientation:  Full (Time, Place, and Person)  Thought Content:  worries, concerns, fears, negative self perceptions  Suicidal Thoughts:  No  Homicidal Thoughts:  No  Memory:  Immediate;   Fair Recent;   Fair Remote;   Fair  Judgement:  Fair  Insight:  Shallow and superficial  Psychomotor Activity:  Restlessness  Concentration:  Fair  Recall:  Fair  Akathisia:  No  Handed:    AIMS (if indicated):     Assets:  Desire for Improvement Housing Social Support  Sleep:  Number of Hours: 6.75   Current Medications: Current Facility-Administered Medications  Medication Dose Route Frequency Provider Last Rate Last Dose  . acetaminophen (TYLENOL) tablet 650 mg  650 mg Oral Q6H PRN Audrea Muscat, NP   650 mg at 05/03/13 1313  . alum & mag hydroxide-simeth (MAALOX/MYLANTA) 200-200-20 MG/5ML suspension 30 mL  30 mL Oral Q4H PRN Evanna Janann August, NP      . amitriptyline (ELAVIL) tablet 75 mg  75 mg Oral QHS Rachael Fee, MD   75 mg at 05/08/13 2150  . atomoxetine (STRATTERA) capsule 25 mg  25 mg Oral Daily Rachael Fee, MD   25 mg at 05/09/13 0829  . diphenhydrAMINE (BENADRYL) capsule 50 mg  50 mg Oral QHS PRN,MR X 1 Evanna Cori Merry Proud, NP   50 mg at 05/07/13 2222  . hydrOXYzine (ATARAX/VISTARIL) tablet 25 mg  25 mg Oral TID Sanjuana Kava, NP   25 mg at 05/09/13 0829  . magnesium hydroxide (MILK OF MAGNESIA) suspension 30 mL  30 mL Oral Daily PRN Evanna Janann August, NP      .  nicotine (NICODERM CQ - dosed in mg/24 hours) patch 21 mg  21 mg Transdermal Q0600 Rachael Fee, MD   21 mg at 05/09/13 [phone]  . pantoprazole (PROTONIX) EC tablet 40 mg  40 mg Oral BID Nanine Means, NP   40 mg at 05/09/13 0829  . ziprasidone (GEODON) capsule 20 mg  20 mg Oral Q1200 Sanjuana Kava, NP   20 mg at 05/09/13 1145  . ziprasidone (GEODON) capsule 40 mg  40 mg Oral Q2200 Sanjuana Kava, NP   40 mg at 05/08/13 2150    Lab Results: No results found for this  or any previous visit (from the past 48 hour(s)).  Physical Findings: AIMS: Facial and Oral Movements Muscles of Facial Expression: None, normal Lips and Perioral Area: None, normal Jaw: None, normal Tongue: None, normal,Extremity Movements Upper (arms, wrists, hands, fingers): None, normal Lower (legs, knees, ankles, toes): None, normal, Trunk Movements Neck, shoulders, hips: None, normal, Overall Severity Severity of abnormal movements (highest score from questions above): None, normal Incapacitation due to abnormal movements: None, normal Patient's awareness of abnormal movements (rate only patient's report): No Awareness, Dental Status Current problems with teeth and/or dentures?: No Does patient usually wear dentures?: No  CIWA:  CIWA-Ar Total: 4 COWS:  COWS Total Score: 1  Treatment Plan Summary: Daily contact with patient to assess and evaluate symptoms and progress in treatment Medication management  Plan: Supportive approach/coping skills/relapse prevention           CBT/mindfulness            Optimize treatment with psychotropics, being mindful of side effects Medical Decision Making Problem Points:  Review of last therapy session (1) and Review of psycho-social stressors (1) Data Points:  Review of medication regiment & side effects (2) Review of new medications or change in dosage (2)  I certify that inpatient services furnished can reasonably be expected to improve the patient's condition.   Octavious Zidek A 05/09/2013, 3:51 PM

## 2013-05-09 NOTE — Tx Team (Signed)
Interdisciplinary Treatment Plan Update (Adult)  Date: 05/09/2013  Time Reviewed:  9:45 AM  Progress in Treatment: Attending groups: Yes Participating in groups:  Yes Taking medication as prescribed:  Yes Tolerating medication:  Yes Family/Significant othe contact made: Yes Patient understands diagnosis:  Yes Discussing patient identified problems/goals with staff:  Yes Medical problems stabilized or resolved:  Yes Denies suicidal/homicidal ideation: Yes Issues/concerns per patient self-inventory:  Yes Other:  New problem(s) identified: N/A  Discharge Plan or Barriers: CSW awaiting a bed at [facility] for further inpatient treatment.  CSW will assess for alternate d/c plans if pt is unable to go to [facility].    Reason for Continuation of Hospitalization: Anxiety Depression Detox Medication Stabilization  Comments: N/A  Estimated length of stay: 2 days, possibly d/c on Friday  For review of initial/current patient goals, please see plan of care.  Attendees: Patient:     Family:     Physician:  Dr. Dub Mikes 05/09/2013 10:35 AM   Nursing:   Lowella Grip, RN 05/09/2013 10:35 AM   Clinical Social Worker:  Reyes Ivan, LCSWA 05/09/2013 10:35 AM   Other: Onnie Boer, RN case manager 05/09/2013 10:35 AM   Other:  Trula Slade, LCSWA 05/09/2013 10:35 AM   Other:  Serena Colonel, NP 05/09/2013 10:35 AM   Other:  Roswell Miners, RN 05/09/2013 10:36 AM   Other: Onnie Boer, RN case manager 05/09/2013 10:36 AM   Other: Tomasita Morrow, care coordination 05/09/2013 10:37 AM   Other:    Other:    Other:    Other:     Scribe for Treatment Team:   Carmina Miller, 05/09/2013 , 10:35 AM

## 2013-05-09 NOTE — Progress Notes (Signed)
Adult Psychoeducational Group Note  Date:  05/09/2013 Time:  9:53 PM  Group Topic/Focus:  NA GROUP  Participation Level:  Active  Participation Quality:  Appropriate  Affect:  Appropriate  Cognitive:  Appropriate  Insight: Appropriate  Engagement in Group:  Engaged  Modes of Intervention:  Discussion  Additional Comments:    [name] 05/09/2013, 9:53 PM

## 2013-05-10 DIAGNOSIS — F1320 Sedative, hypnotic or anxiolytic dependence, uncomplicated: Principal | ICD-10-CM

## 2013-05-10 DIAGNOSIS — F1420 Cocaine dependence, uncomplicated: Secondary | ICD-10-CM

## 2013-05-10 DIAGNOSIS — F1220 Cannabis dependence, uncomplicated: Secondary | ICD-10-CM

## 2013-05-10 MED ORDER — ATOMOXETINE HCL 25 MG PO CAPS: 25.0000 mg | ORAL_CAPSULE | Freq: Every day | ORAL | Status: DC

## 2013-05-10 MED ORDER — HYDROXYZINE HCL 25 MG PO TABS: 25.0000 mg | ORAL_TABLET | Freq: Three times a day (TID) | ORAL | Status: DC

## 2013-05-10 MED ORDER — AMITRIPTYLINE HCL 75 MG PO TABS: 75.0000 mg | ORAL_TABLET | Freq: Every day | ORAL | Status: DC

## 2013-05-10 MED ORDER — ZIPRASIDONE HCL 20 MG PO CAPS: ORAL_CAPSULE | ORAL | Status: DC

## 2013-05-10 NOTE — Progress Notes (Signed)
Patient ID: Derrick [name], male   DOB: 30-Jul-1986, 27 y.o.   MRN: [id number] He Has been in bed sleeping except to get hs medication  and meal. He did co tooth pain and was given Tylenol for pain and he has been able to sleep. Self inventory: He denies SI thoughts and his depression and hopelessness has increased for 2 and 1 yesterday to 6 and 4 today. Stated that he thinks the geodone is helping.

## 2013-05-10 NOTE — Progress Notes (Signed)
Adult Psychoeducational Group Note  Date:  05/10/2013 Time:  1:52 PM  Group Topic/Focus:  Building Self Esteem:   The Focus of this group is helping patients become aware of the effects of self-esteem on their lives, the things they and others do that enhance or undermine their self-esteem, seeing the relationship between their level of self-esteem and the choices they make and learning ways to enhance self-esteem.  Participation Level:  Minimal  Participation Quality:  Appropriate and Attentive  Affect:  Flat  Cognitive:  Alert and Appropriate  Insight: Good  Engagement in Group:  Engaged  Modes of Intervention:  Discussion, Exploration, Socialization and Support  Additional Comments:  Pt came to group and shared that his attitude and trust issues were effecting his self-esteem. Pt plans on changing this by staying on his medications and going to therapy and seeing his psychiatrist.   Cathlean Cower 05/10/2013, 1:52 PM

## 2013-05-10 NOTE — Progress Notes (Signed)
Recreation Therapy Notes  Date: 09.18.2014 Time: 2:30pm Location: [address] Dayroom  Group Topic: Software engineer Activities (AAA)  Behavioral Response: Engaged, Attentive, Appropriate  Affect: Euthymic  Clinical Observations/Feedback: Dog Team: [employer] Patient interacted appropriately with peer, dog team, LRT and MHT.   Marykay Lex Porshe Fleagle, LRT/CTRS  Purvis Sidle L 05/10/2013 4:20 PM
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
## 2013-05-10 NOTE — Progress Notes (Signed)
Patient ID: Derrick [name], male   DOB: 07-15-1986, 27 y.o.   MRN: [id number] Writer reviewed pt discharge instructions with pt including medications, follow up care and crisis intervention. Pt acknowledged understanding of instructions and states that he has no reservations about leaving [facility] at this time. Pt denies SI/HI and AVH. Pt mood and affect are appropriate to the situation. Writer told pt to wait in the lobby while I retrieved his belongings from the locker. There was another pt in the search room, so I was bringing his belongings to him. When I returned he had already left with his friend. Writer returned his belongings to the same locker, # 39.
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
## 2013-05-10 NOTE — [facility] Group Notes (Signed)
[facility] LCSW Group Therapy  05/10/2013  1:15 PM   Type of Therapy:  Group Therapy  Participation Level:  Active  Participation Quality:  Appropriate and Attentive  Affect:  Appropriate, Anxious  Cognitive:  Alert and Appropriate  Insight:  Developing/Improving and Engaged  Engagement in Therapy:  Developing/Improving and Engaged  Modes of Intervention:  Clarification, Confrontation, Discussion, Education, Exploration, Limit-setting, Orientation, Problem-solving, Rapport Building, Dance movement psychotherapist, Socialization and Support  Summary of Progress/Problems: The topic for group was balance in life.  Today's group focused on defining balance in one's own words, identifying things that can knock one off balance, and exploring healthy ways to maintain balance in life. Group members were asked to provide an example of a time when they felt off balance, describe how they handled that situation,and process healthier ways to regain balance in the future. Group members were asked to share the most important tool for maintaining balance that they learned while at [facility] and how they plan to apply this method after discharge.  Pt shared that when he is using drugs he is only able to focus on drugs.  Pt was able to process how his anxiety also throws him off balance and was able to identify his symptoms.  Pt states that he plans to follow through with after care plans this time to regain balance in his life.  Pt actively participated and was engaged in group discussion.     Reyes Ivan, LCSWA 05/10/2013 2:54 PM

## 2013-05-10 NOTE — Progress Notes (Signed)
Recreation Therapy Notes  Date: 09.18.2014 Time: 3:00pm Location: 300 Hall Dayroom  Group Topic: Communication, Team Building, Problem Solving  Goal Area(s) Addresses:  Patient will effectively work with peer towards shared goal.  Patient will identify skill used to make activity successful.  Patient will identify how skills used during activity can be used to reach post d/c goals.   Behavioral Response: Disengaged, Anxious.  Intervention: Problem Solving Activitiy  Activity: Life Boat. Patients were given a scenario about being on a sinking yacht. Patients were informed the yacht included 15 guest, 8 of which could be placed on the life boat, along with all group members. Individuals on guest list were of varying socioeconomic classes such as a Education officer, museum, Materials engineer, Midwife, Tree surgeon.   Education: Customer service manager, Discharge Planning   Education Outcome: Needs additional education  Clinical Observations/Feedback: Patient arrived to group session late, upon arrival patient appeared anxious and agitated, as he made no eye contact and wrung his hands. Patient remained in group session for approximately 5 minutes, patient then exited group session without explanation, returning within last 7 minutes of group session. Upon return patient bounced leg up and down, wrung hands and again made no eye contact with anyone. Patient made no statements and did not engaged in group activity.   Marykay Lex Jonae Renshaw, LRT/CTRS  Jearl Klinefelter 05/10/2013 4:45 PM
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
## 2013-05-10 NOTE — Progress Notes (Signed)
[facility] Adult Case Management Discharge Plan :  Will you be returning to the same living situation after discharge: Yes,  home until [facility] admission At discharge, do you have transportation home?:Yes,  pt's grandmother Do you have the ability to pay for your medications:Yes,  mental health  Release of information consent forms completed and in the chart;  Patient's signature needed at discharge.  Patient to Follow up at: Follow-up Information   Follow up with [facility] On 05/11/2013. (Walk in on this date for hospital discharge appointment, for medication management.  Walk in clinic is Monday - Friday 8 am - 3 pm)    Contact information:   [address], [place] [address] Phone: [phone] Fax: [phone]      Follow up with [facility] On 05/14/2013. (Arrive by 8AM for screening. Note: this is not admission date. Please bring ID and discharge packet. Contact person: TRACY)    Contact information:   [address] [place], [place] [address] Phone: [phone] Fax: [phone]      Patient denies SI/HI:   Yes,  during group/self report.    Safety Planning and Suicide Prevention discussed:  Yes,  SPE completed with pt's grandmother. Pt provided with SPI pamphlet and encouraged to share information with his support network.   Smart, Haston Casebolt 05/10/2013, 3:40 PM

## 2013-05-14 NOTE — Progress Notes (Signed)
Patient Discharge Instructions:  After Visit Summary (AVS):   Faxed to:  05/14/13 Discharge Summary Note:   Faxed to:  05/14/13 Psychiatric Admission Assessment Note:   Faxed to:  05/14/13 Suicide Risk Assessment - Discharge Assessment:   Faxed to:  05/14/13 Faxed/Sent to the Next Level Care provider:  05/14/13 Faxed to [facility] @ [phone] Faxed to [employer] @ [phone]  Jerelene Redden, 05/14/2013, 3:54 PM

## 2015-01-16 ENCOUNTER — Emergency Department (HOSPITAL_COMMUNITY)
Admission: EM | Admit: 2015-01-16 | Discharge: 2015-01-17 | Disposition: A | Discharge status: Other Acute Inpatient Hospital | Payer: Self-pay | Attending: Emergency Medicine | Admitting: Emergency Medicine

## 2015-01-16 ENCOUNTER — Encounter (HOSPITAL_COMMUNITY): Payer: Self-pay | Admitting: *Deleted

## 2015-01-16 DIAGNOSIS — I10 Essential (primary) hypertension: Secondary | ICD-10-CM | POA: Insufficient documentation

## 2015-01-16 DIAGNOSIS — F1010 Alcohol abuse, uncomplicated: Secondary | ICD-10-CM | POA: Insufficient documentation

## 2015-01-16 DIAGNOSIS — F319 Bipolar disorder, unspecified: Secondary | ICD-10-CM | POA: Insufficient documentation

## 2015-01-16 DIAGNOSIS — R45851 Suicidal ideations: Secondary | ICD-10-CM

## 2015-01-16 DIAGNOSIS — Z79899 Other long term (current) drug therapy: Secondary | ICD-10-CM | POA: Insufficient documentation

## 2015-01-16 DIAGNOSIS — F1410 Cocaine abuse, uncomplicated: Secondary | ICD-10-CM | POA: Insufficient documentation

## 2015-01-16 DIAGNOSIS — Z915 Personal history of self-harm: Secondary | ICD-10-CM | POA: Insufficient documentation

## 2015-01-16 DIAGNOSIS — F1910 Other psychoactive substance abuse, uncomplicated: Secondary | ICD-10-CM

## 2015-01-16 DIAGNOSIS — Z7982 Long term (current) use of aspirin: Secondary | ICD-10-CM | POA: Insufficient documentation

## 2015-01-16 DIAGNOSIS — F419 Anxiety disorder, unspecified: Secondary | ICD-10-CM | POA: Insufficient documentation

## 2015-01-16 DIAGNOSIS — Z720 Tobacco use: Secondary | ICD-10-CM | POA: Insufficient documentation

## 2015-01-16 DIAGNOSIS — F1210 Cannabis abuse, uncomplicated: Secondary | ICD-10-CM | POA: Insufficient documentation

## 2015-01-16 LAB — RAPID URINE DRUG SCREEN, HOSP PERFORMED
AMPHETAMINES: NOT DETECTED
BARBITURATES: NOT DETECTED
Benzodiazepines: NOT DETECTED
Cocaine: POSITIVE — AB
OPIATES: NOT DETECTED
Tetrahydrocannabinol: POSITIVE — AB

## 2015-01-16 LAB — COMPREHENSIVE METABOLIC PANEL
ALBUMIN: 4.3 g/dL (ref 3.5–5.0)
ALK PHOS: 57 U/L (ref 38–126)
ALT: 17 U/L (ref 17–63)
ANION GAP: 9 (ref 5–15)
AST: 23 U/L (ref 15–41)
BILIRUBIN TOTAL: 0.5 mg/dL (ref 0.3–1.2)
BUN: 18 mg/dL (ref 6–20)
CHLORIDE: 105 mmol/L (ref 101–111)
CO2: 26 mmol/L (ref 22–32)
CREATININE: 0.94 mg/dL (ref 0.61–1.24)
Calcium: 9.4 mg/dL (ref 8.9–10.3)
Glucose, Bld: 91 mg/dL (ref 65–99)
POTASSIUM: 4.2 mmol/L (ref 3.5–5.1)
Sodium: 140 mmol/L (ref 135–145)
TOTAL PROTEIN: 7.5 g/dL (ref 6.5–8.1)

## 2015-01-16 LAB — CBC WITH DIFFERENTIAL/PLATELET
Basophils Absolute: 0 10*3/uL (ref 0.0–0.1)
Basophils Relative: 1 % (ref 0–1)
EOS PCT: 1 % (ref 0–5)
Eosinophils Absolute: 0.1 10*3/uL (ref 0.0–0.7)
HEMATOCRIT: 44 % (ref 39.0–52.0)
HEMOGLOBIN: 14.8 g/dL (ref 13.0–17.0)
LYMPHS ABS: 1.7 10*3/uL (ref 0.7–4.0)
Lymphocytes Relative: 19 % (ref 12–46)
MCH: 30.9 pg (ref 26.0–34.0)
MCHC: 33.6 g/dL (ref 30.0–36.0)
MCV: 91.9 fL (ref 78.0–100.0)
MONO ABS: 0.5 10*3/uL (ref 0.1–1.0)
MONOS PCT: 5 % (ref 3–12)
NEUTROS ABS: 6.6 10*3/uL (ref 1.7–7.7)
NEUTROS PCT: 74 % (ref 43–77)
Platelets: 299 10*3/uL (ref 150–400)
RBC: 4.79 MIL/uL (ref 4.22–5.81)
RDW: 12.7 % (ref 11.5–15.5)
WBC: 8.8 10*3/uL (ref 4.0–10.5)

## 2015-01-16 LAB — ETHANOL: Alcohol, Ethyl (B): <5 mg/dL (ref <5)

## 2015-01-16 MED ORDER — ONDANSETRON HCL 4 MG PO TABS: 4.0000 mg | ORAL_TABLET | Freq: Three times a day (TID) | ORAL | Status: DC | PRN

## 2015-01-16 MED ORDER — IBUPROFEN 200 MG PO TABS
600.0000 mg | ORAL_TABLET | Freq: Three times a day (TID) | ORAL | Status: DC | PRN
  Administered 2015-01-16: 600 mg via ORAL
  Filled 2015-01-16: qty 3

## 2015-01-16 MED ORDER — ACETAMINOPHEN 325 MG PO TABS: 650.0000 mg | ORAL_TABLET | ORAL | Status: DC | PRN

## 2015-01-16 MED ORDER — NICOTINE 21 MG/24HR TD PT24: 21.0000 mg | MEDICATED_PATCH | Freq: Every day | TRANSDERMAL | Status: DC
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
## 2015-01-16 NOTE — ED Notes (Signed)
Bed: WBH40 Expected date:  Expected time:  Means of arrival:  Comments: Tr3 

## 2015-01-16 NOTE — ED Notes (Signed)
Pt states that he is having thoughts of suicide for the last couple of weeks; pt states that his mother recently died and he is having trouble dealing with it; pt states that he has been doing a lot of drugs to try to help cope; pt states that his plan is to overdose on sleeping pills or hang himself

## 2015-01-17 ENCOUNTER — Encounter (HOSPITAL_COMMUNITY): Payer: Self-pay

## 2015-01-17 ENCOUNTER — Inpatient Hospital Stay (HOSPITAL_COMMUNITY)
Admission: AD | Admit: 2015-01-17 | Discharge: 2015-01-24 | DRG: 885 | Disposition: A | Discharge status: Home / Self Care | Payer: Federal, State, Local not specified - Other | Source: Intra-hospital | Attending: Psychiatry | Admitting: Psychiatry

## 2015-01-17 DIAGNOSIS — F902 Attention-deficit hyperactivity disorder, combined type: Secondary | ICD-10-CM | POA: Diagnosis present

## 2015-01-17 DIAGNOSIS — F17210 Nicotine dependence, cigarettes, uncomplicated: Secondary | ICD-10-CM | POA: Diagnosis present

## 2015-01-17 DIAGNOSIS — F1910 Other psychoactive substance abuse, uncomplicated: Secondary | ICD-10-CM | POA: Diagnosis present

## 2015-01-17 DIAGNOSIS — F332 Major depressive disorder, recurrent severe without psychotic features: Principal | ICD-10-CM

## 2015-01-17 DIAGNOSIS — I10 Essential (primary) hypertension: Secondary | ICD-10-CM | POA: Diagnosis present

## 2015-01-17 DIAGNOSIS — F329 Major depressive disorder, single episode, unspecified: Secondary | ICD-10-CM | POA: Diagnosis present

## 2015-01-17 DIAGNOSIS — R45851 Suicidal ideations: Secondary | ICD-10-CM

## 2015-01-17 MED ORDER — HYDROXYZINE HCL 25 MG PO TABS: 25.0000 mg | ORAL_TABLET | Freq: Four times a day (QID) | ORAL | Status: DC | PRN

## 2015-01-17 MED ORDER — CHLORDIAZEPOXIDE HCL 25 MG PO CAPS
25.0000 mg | ORAL_CAPSULE | Freq: Four times a day (QID) | ORAL | Status: AC
  Administered 2015-01-17 – 2015-01-18 (×6): 25 mg via ORAL
  Filled 2015-01-17 (×6): qty 1

## 2015-01-17 MED ORDER — MAGNESIUM HYDROXIDE 400 MG/5ML PO SUSP
30.0000 mL | Freq: Every day | ORAL | Status: DC | PRN
  Administered 2015-01-23: 30 mL via ORAL
  Filled 2015-01-17: qty 30

## 2015-01-17 MED ORDER — ZOLPIDEM TARTRATE 10 MG PO TABS
10.0000 mg | ORAL_TABLET | Freq: Every evening | ORAL | Status: DC | PRN
  Administered 2015-01-17 – 2015-01-23 (×7): 10 mg via ORAL
  Filled 2015-01-17 (×7): qty 1

## 2015-01-17 MED ORDER — VITAMIN B-1 100 MG PO TABS
100.0000 mg | ORAL_TABLET | Freq: Every day | ORAL | Status: DC
  Administered 2015-01-18 – 2015-01-24 (×7): 100 mg via ORAL
  Filled 2015-01-17 (×8): qty 1

## 2015-01-17 MED ORDER — ALUM & MAG HYDROXIDE-SIMETH 200-200-20 MG/5ML PO SUSP
30.0000 mL | ORAL | Status: DC | PRN
  Administered 2015-01-17 – 2015-01-22 (×5): 30 mL via ORAL
  Filled 2015-01-17 (×5): qty 30

## 2015-01-17 MED ORDER — PNEUMOCOCCAL VAC POLYVALENT 25 MCG/0.5ML IJ INJ: 0.5000 mL | INJECTION | INTRAMUSCULAR | Status: DC

## 2015-01-17 MED ORDER — FLUOXETINE HCL 20 MG PO CAPS
20.0000 mg | ORAL_CAPSULE | Freq: Every day | ORAL | Status: DC
  Administered 2015-01-17 – 2015-01-24 (×8): 20 mg via ORAL
  Filled 2015-01-17 (×9): qty 1
  Filled 2015-01-17: qty 21

## 2015-01-17 MED ORDER — ONDANSETRON HCL 4 MG PO TABS
4.0000 mg | ORAL_TABLET | Freq: Three times a day (TID) | ORAL | Status: DC | PRN
  Administered 2015-01-17 – 2015-01-20 (×3): 4 mg via ORAL
  Filled 2015-01-17 (×3): qty 1

## 2015-01-17 MED ORDER — CHLORDIAZEPOXIDE HCL 25 MG PO CAPS: 25.0000 mg | ORAL_CAPSULE | Freq: Four times a day (QID) | ORAL | Status: AC | PRN

## 2015-01-17 MED ORDER — LOPERAMIDE HCL 2 MG PO CAPS: 2.0000 mg | ORAL_CAPSULE | ORAL | Status: AC | PRN

## 2015-01-17 MED ORDER — GABAPENTIN 100 MG PO CAPS
100.0000 mg | ORAL_CAPSULE | Freq: Three times a day (TID) | ORAL | Status: DC
  Administered 2015-01-17 – 2015-01-24 (×20): 100 mg via ORAL
  Filled 2015-01-17: qty 63
  Filled 2015-01-17 (×8): qty 1
  Filled 2015-01-17: qty 63
  Filled 2015-01-17 (×13): qty 1
  Filled 2015-01-17: qty 63
  Filled 2015-01-17 (×2): qty 1

## 2015-01-17 MED ORDER — NICOTINE 21 MG/24HR TD PT24
21.0000 mg | MEDICATED_PATCH | Freq: Every day | TRANSDERMAL | Status: DC
  Administered 2015-01-17 – 2015-01-24 (×8): 21 mg via TRANSDERMAL
  Filled 2015-01-17 (×5): qty 1
  Filled 2015-01-17: qty 14
  Filled 2015-01-17 (×4): qty 1

## 2015-01-17 MED ORDER — CHLORDIAZEPOXIDE HCL 25 MG PO CAPS
25.0000 mg | ORAL_CAPSULE | Freq: Three times a day (TID) | ORAL | Status: AC
  Administered 2015-01-19 (×3): 25 mg via ORAL
  Filled 2015-01-17 (×3): qty 1

## 2015-01-17 MED ORDER — ADULT MULTIVITAMIN W/MINERALS CH
1.0000 | ORAL_TABLET | Freq: Every day | ORAL | Status: DC
  Administered 2015-01-17 – 2015-01-24 (×8): 1 via ORAL
  Filled 2015-01-17 (×10): qty 1

## 2015-01-17 MED ORDER — ASPIRIN 81 MG PO CHEW
162.0000 mg | CHEWABLE_TABLET | Freq: Once | ORAL | Status: AC
  Administered 2015-01-17: 162 mg via ORAL
  Filled 2015-01-17: qty 2

## 2015-01-17 MED ORDER — CHLORDIAZEPOXIDE HCL 25 MG PO CAPS
25.0000 mg | ORAL_CAPSULE | ORAL | Status: AC
  Administered 2015-01-20 (×2): 25 mg via ORAL
  Filled 2015-01-17 (×2): qty 1

## 2015-01-17 MED ORDER — HYDROXYZINE HCL 25 MG PO TABS
25.0000 mg | ORAL_TABLET | Freq: Three times a day (TID) | ORAL | Status: DC
  Administered 2015-01-17 – 2015-01-18 (×4): 25 mg via ORAL
  Filled 2015-01-17 (×11): qty 1

## 2015-01-17 MED ORDER — ACETAMINOPHEN 325 MG PO TABS
650.0000 mg | ORAL_TABLET | Freq: Four times a day (QID) | ORAL | Status: DC | PRN
  Administered 2015-01-17 – 2015-01-22 (×8): 650 mg via ORAL
  Filled 2015-01-17 (×8): qty 2

## 2015-01-17 MED ORDER — TRAZODONE HCL 50 MG PO TABS: 50.0000 mg | ORAL_TABLET | Freq: Every evening | ORAL | Status: DC | PRN

## 2015-01-17 MED ORDER — CHLORDIAZEPOXIDE HCL 25 MG PO CAPS
25.0000 mg | ORAL_CAPSULE | Freq: Every day | ORAL | Status: AC
  Administered 2015-01-21: 25 mg via ORAL
  Filled 2015-01-17: qty 1

## 2015-01-17 MED ORDER — THIAMINE HCL 100 MG/ML IJ SOLN
100.0000 mg | Freq: Once | INTRAMUSCULAR | Status: AC
  Administered 2015-01-17: 100 mg via INTRAMUSCULAR
  Filled 2015-01-17: qty 2

## 2015-01-17 NOTE — Progress Notes (Signed)
NUTRITION ASSESSMENT  Pt identified as at risk on the Malnutrition Screen Tool  INTERVENTION: 1. Educated patient on the importance of nutrition and encouraged intake of food and beverages. 2. Discussed weight goals. 3. Supplements: will order Resource Breeze po BID, each supplement provides 250 kcal and 9 grams of protein   NUTRITION DIAGNOSIS: Unintentional weight loss related to sub-optimal intake as evidenced by pt report.   Goal: Pt to meet >/= 90% of their estimated nutrition needs.  Monitor:  PO intake  Assessment:  Pt seen for MST. Pt reports he was able to eat a little bit for breakfast this AM. PTA he had a poor appetite for a couple of weeks but would sometimes force himself to consume something. He states he lost weight during this time but is unsure how much he may have lost. He is likely not meeting needs at this time; will order Resource Breeze BID to supplement.  29 y.o. male  Height: Ht Readings from Last 1 Encounters:  01/17/15 5' 9.5" (1.765 m)    Weight: Wt Readings from Last 1 Encounters:  01/17/15 178 lb (80.74 kg)    Weight Hx: Wt Readings from Last 10 Encounters:  01/17/15 178 lb (80.74 kg)  05/03/13 182 lb (82.555 kg)  02/26/13 190 lb (86.183 kg)  02/20/13 190 lb (86.183 kg)  01/03/13 180 lb (81.647 kg)    BMI:  Body mass index is 25.92 kg/(m^2). Pt meets criteria for overweight based on current BMI.  Estimated Nutritional Needs: Kcal: 25-30 kcal/kg Protein: > 1 gram protein/kg Fluid: 1 ml/kcal  Diet Order: Diet regular Room service appropriate?: Yes; Fluid consistency:: Thin Pt is also offered choice of unit snacks mid-morning and mid-afternoon.  Pt is eating as desired.   Lab results and medications reviewed.    Trenton GammonJessica Li Bobo, RD, LDN Inpatient Clinical Dietitian Pager # [phone] After hours/weekend pager # [phone]

## 2015-01-17 NOTE — ED Notes (Signed)
Patient is a 29 year old voluntary patient that came to ED complaining of increased depression and SI. Reports that his mother passed away a couple of weeks ago. He states that he has had depression before but this feels "different" and he did not want to do "something stupid". He has a headache which he describes as a migraine that radiates into left eye. Ibuprofen given for pain. Patient positive marijuana and cocaine but admitted to doing drugs prior to drug screen to cope with stress. Meal given. Cooperative with staff

## 2015-01-17 NOTE — [facility] Suicide Risk Assessment (Signed)
[facility] INPATIENT:  Family/Significant Other Suicide Prevention Education  Suicide Prevention Education:  Patient Refusal for Family/Significant Other Suicide Prevention Education: The patient Derrick [name] has refused to provide written consent for family/significant other to be provided Family/Significant Other Suicide Prevention Education during admission and/or prior to discharge.  Physician notified. SPE reviewed with patient and brochure provided. Patient encouraged to return to hospital if having suicidal thoughts, patient verbalized his/her understanding and has no further questions at this time.   Meshelle Holness, West CarboKristin L 01/17/2015, 3:06 PM

## 2015-01-17 NOTE — Tx Team (Signed)
Interdisciplinary Treatment Plan Update (Adult) Date: 01/17/2015   Time Reviewed: 9:30 AM  Progress in Treatment: Attending groups: Continuing to assess, patient new to milieu. Participating in groups: Continuing to assess, patient new to milieu. Taking medication as prescribed: Yes Tolerating medication: Yes Family/Significant other contact made: No, CSW will assess for appropriate contacts Patient understands diagnosis: Yes Discussing patient identified problems/goals with staff: Yes Medical problems stabilized or resolved: Yes Denies suicidal/homicidal ideation: Yes Issues/concerns per patient self-inventory: Yes Other:  New problem(s) identified: N/A  Discharge Plan or Barriers:  5/27: CSW continuing to assess, patient new to milieu. Has multiple previous admissions, most recently in 04/2013.  Reason for Continuation of Hospitalization:  Depression Anxiety Medication Stabilization   Comments: N/A  Estimated length of stay: 3-5 days  For review of initial/current patient goals, please see plan of care.  Patient is a 29 year old Male admitted for polysubstance abuse and SI with plan to hang self or overdose. [facility] Patient will benefit from crisis stabilization, medication evaluation, group therapy, and psycho education in addition to case management for discharge planning. Patient and CSW reviewed pt's identified goals and treatment plan. Pt verbalized understanding and agreed to treatment plan.   Attendees: Patient:    Family:    Physician: Dr. Dub MikesLugo 01/17/2015 9:30 AM  Nursing: Omelia BlackwaterNoelle Ardley; Claudette LawsStephanie Smith, RN 01/17/2015 9:30 AM  Clinical Social Worker: Samuella BruinKristin Coila Wardell,  LCSWA 01/17/2015 9:30 AM  Other: Chad CordialLauren Carter, LCSWA  01/17/2015 9:30 AM  Other: Leisa LenzValerie Enoch, [facility] Liaison 01/17/2015 9:30 AM  Other: Onnie BoerJennifer Clark, Case Manager 01/17/2015 9:30 AM  Other: Assunta FoundShuvon Rankin, NP  01/17/2015 9:30 AM   Scribe for Treatment Team:   Samuella BruinKristin Tausha Milhoan, MSW, LCSWA [phone]
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
## 2015-01-17 NOTE — [facility] Group Notes (Signed)
[facility] LCSW Group Therapy 01/17/2015  1:15 PM   Type of Therapy: Group Therapy  Participation Level: Did Not Attend. Patient invited to participate but declined.  Samuella BruinKristin Epimenio Schetter, MSW, [employer]CSWA Clinical Social Worker [facility] [phone]

## 2015-01-17 NOTE — Progress Notes (Signed)
Recreation Therapy Notes  Date: 05.27.16 Time: 9:30am Location: 300 Group Room  Group Topic: Stress Management  Goal Area(s) Addresses:  Patient will verbalize importance of using healthy stress management.  Patient will identify positive emotions associated with healthy stress management.   Intervention: Progressive Muscle Relaxation, Guided Imagery Script  Activity :  Progressive Muscle Relaxation & Guided Imagery.  LRT introduced and educated patients on stress management  Techniques of progressive muscle relaxation and guided imagery.  Scripts were used to deliver both techniques to patients, patients were asked to follow the script read allowed by LRT to engage in practicing both stress management techniques.  Education:  Stress Management, Discharge Planning.   Education Outcome: Acknowledges edcuation/In group clarification offered/Needs additional education  Clinical Observations/Feedback: Patient did not attend group.  Silver Achey Harlene SaltsLindasy, LRT/CTRS         Caroll RancherLindsay, Grayer Sproles A 01/17/2015 4:03 PM

## 2015-01-17 NOTE — Progress Notes (Signed)
D.  Pt pleasant on approach, denies complaints at this time.  States Trazodone was not effective last night, spoke to doctor and now has Ambien.  Pt is hopeful tonight he will sleep better.  Denies SI/HI/hallucinations at this time. Positive for evening AA group.  A.  Support and encouragement offered  R.  Pt remains safe on unit, will continue to monitor.

## 2015-01-17 NOTE — Clinical Social Work Note (Signed)
[facility] referral made- Authorization # [id number].  [facility].  [facility] screening scheduled for June 6th.  Samuella BruinKristin Donnamaria Shands, MSW, [employer]CSWA Clinical Social Worker [facility] [phone]

## 2015-01-17 NOTE — [facility] Group Notes (Signed)
[facility]  01/17/2015  8:45 AM  Participation Quality: Did Not Attend. Patient invited to participate but declined.  Samuella BruinKristin Javon Snee, MSW, [employer]CSWA Clinical Social Worker [facility] [phone]
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
## 2015-01-17 NOTE — Tx Team (Addendum)
Initial Interdisciplinary Treatment Plan   PATIENT STRESSORS: Financial difficulties Legal issue Loss of several significant individuals in the past month Medication change or noncompliance Occupational concerns Substance abuse   PATIENT STRENGTHS: Average or above average intelligence General fund of knowledge Motivation for treatment/growth Physical Health Religious Affiliation Supportive family/friends   PROBLEM LIST: Problem List/Patient Goals Date to be addressed Date deferred Reason deferred Estimated date of resolution  "SI" 01/17/15     "Depression" 01/17/15     Substance abuse      "Anxiety"                                     DISCHARGE CRITERIA:  Improved stabilization in mood, thinking, and/or behavior Motivation to continue treatment in a less acute level of care Need for constant or close observation no longer present Verbal commitment to aftercare and medication compliance Withdrawal symptoms are absent or subacute and managed without 24-hour nursing intervention  PRELIMINARY DISCHARGE PLAN: Attend PHP/IOP  PATIENT/FAMIILY INVOLVEMENT: This treatment plan has been presented to and reviewed with the patient, Emerson Monteimothy J Grudzinski.  The patient and family have been given the opportunity to ask questions and make suggestions.  Cella Cappello A 01/17/2015, 5:45 AM
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
## 2015-01-17 NOTE — BH Assessment (Signed)
Reviewed ED notes prior to assessment. Pt has hx of bipolar and substance abuse. Recently loss his mother, and has had SI with plan to overdose for the past two weeks.   Assessment to commence shortly.    Derrick BernhardtNancy Trevon [name], [facility] Triage Specialist 01/17/2015 12:15 AM
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
## 2015-01-17 NOTE — Progress Notes (Signed)
Pt attended AA group 

## 2015-01-17 NOTE — ED Notes (Signed)
Patient being transferred across to [facility] for further treatment. Cooperative with the process.
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
## 2015-01-18 DIAGNOSIS — F902 Attention-deficit hyperactivity disorder, combined type: Secondary | ICD-10-CM | POA: Diagnosis present

## 2015-01-18 MED ORDER — HYDROXYZINE HCL 25 MG PO TABS
25.0000 mg | ORAL_TABLET | Freq: Three times a day (TID) | ORAL | Status: DC | PRN
  Filled 2015-01-18: qty 20

## 2015-01-18 MED ORDER — ATOMOXETINE HCL 25 MG PO CAPS
25.0000 mg | ORAL_CAPSULE | Freq: Every day | ORAL | Status: DC
  Administered 2015-01-18 – 2015-01-19 (×2): 25 mg via ORAL
  Filled 2015-01-18 (×4): qty 1

## 2015-01-18 NOTE — Progress Notes (Signed)
D: Patient is alert and oriented. Pt's mood and affect is depressed and blunted, animated at times. Pt denies SI so far this morning stating he does not have suicidal thoughts "first thing" in the morning, pt later returned to RN and endorses passive suicidal thoughts. Pt denies HI and AVH. Pt rates depression 9/10 and anxiety 7/10. Pt reports HA but refuses PRN medication and reports he plans to rest/relax. Pt complains of nausea but refuses PRN medication. Pt is attending unit groups. A: Pt encouraged to consult with RN for PRN medication if needed. Active listening by RN. Encouragement/Support provided to pt. Ginger ale given to pt when nauseous. Medication education reviewed with pt. Scheduled medications administered per providers orders (See MAR). 15 minute checks continued per protocol for patient safety.  R: Pt verbally contracts for safety, agrees not to harm self and agree's to come to staff with increased intensity of suicidal thoughts. Patient cooperative and receptive to nursing interventions. Pt remains safe.

## 2015-01-18 NOTE — [facility] Group Notes (Signed)
[facility] Group Notes: (Clinical Social Work)   01/18/2015      Type of Therapy:  Group Therapy   Participation Level:  Did Not Attend despite MHT prompting   Ambrose MantleMareida Grossman-Orr, LCSW 01/18/2015, 12:00 PM

## 2015-01-18 NOTE — Plan of Care (Signed)
Problem: Ineffective individual coping Goal: STG: Patient will remain free from self harm Outcome: Progressing Patient remains free from self harm. Pt verbally agrees not to harm self.  Problem: Alteration in mood Goal: LTG-Patient reports reduction in suicidal thoughts (Patient reports reduction in suicidal thoughts and is able to verbalize a safety plan for whenever patient is feeling suicidal)  Outcome: Not Progressing Patient continues to endorse passive suicidal thoughts today.  Problem: Diagnosis: Increased Risk For Suicide Attempt Goal: STG-Patient Will Attend All Groups On The Unit Outcome: Progressing Patient is attending unit groups today. Goal: STG-Patient Will Comply With Medication Regime Outcome: Progressing Patient has adhered to medication regimen today with ease.
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
## 2015-01-18 NOTE — Progress Notes (Signed)
[facility] MD Progress Note  01/18/2015 5:35 PM Derrick [name]  MRN:  [id number] Subjective:  Derrick [name] found out that his father was put in jail due to probation violation. He is concerned as he was counting on him to help him out. His brother is also in jail. States that things got to go down hill for him after his mother died. States " I was mama's boy" he states he took care of her during the last two years until she died. States he still has the image in his mind of her with foam in his mouth. States he does not know what to do. States he does better when he has a Production assistant, radio. Before his mother died knowing he had to take care of her kept him going. Now he wants to be able to be a father to his son Principal Problem: Major depressive disorder, recurrent, severe without psychotic behavior Diagnosis:   Patient Active Problem List   Diagnosis Date Noted  . Major depressive disorder, recurrent, severe without psychotic behavior [F33.2] 01/17/2015    Priority: High  . Polysubstance abuse [F19.10] 05/02/2013    Priority: High  . Suicidal ideation [R45.851] 05/02/2013    Priority: High  . MDD (major depressive disorder) [F32.2] 05/02/2013  . Suicide attempt [T14.91] 05/02/2013  . Impulse control disorder, unspecified [F63.9] 01/04/2013  . Unspecified episodic mood disorder [F39] 01/04/2013   Total Time spent with patient: 30 minutes   Past Medical History:  Past Medical History  Diagnosis Date  . Bipolar 1 disorder   . Substance abuse   . Anxiety   . Hypertension   . Headache(784.0)    History reviewed. No pertinent past surgical history. Family History: History reviewed. No pertinent family history. Social History:  History  Alcohol Use  . 42.0 oz/week  . 70 Cans of beer per week    Comment: everyday drinker      History  Drug Use  . Yes  . Special: Marijuana, "Crack" cocaine, Cocaine    Comment: heroin    History   Social History  . Marital Status: Single    Spouse Name: N/A   . Number of Children: N/A  . Years of Education: N/A   Social History Main Topics  . Smoking status: Current Every Day Smoker -- 2.00 packs/day    Types: Cigarettes  . Smokeless tobacco: Not on file  . Alcohol Use: 42.0 oz/week    70 Cans of beer per week     Comment: everyday drinker   . Drug Use: Yes    Special: Marijuana, "Crack" cocaine, Cocaine     Comment: heroin  . Sexual Activity: Yes   Other Topics Concern  . None   Social History Narrative   Additional History:    Sleep: Fair  Appetite:  Fair   Assessment:   Musculoskeletal: Strength & Muscle Tone: within normal limits Gait & Station: normal Patient leans: N/A   Psychiatric Specialty Exam: Physical Exam  Review of Systems  Constitutional: Positive for malaise/fatigue.  HENT: Negative.   Eyes: Negative.   Respiratory: Negative.   Cardiovascular: Negative.   Gastrointestinal: Negative.   Genitourinary: Negative.   Musculoskeletal: Negative.   Skin: Negative.   Neurological: Positive for weakness.  Endo/Heme/Allergies: Negative.   Psychiatric/Behavioral: Positive for depression and substance abuse. The patient is nervous/anxious.     Blood pressure 142/71, pulse 61, temperature 97.4 F (36.3 C), temperature source Oral, resp. rate 16, height 5' 9.5" (1.765 m), weight 80.74 kg (178 lb).Body  mass index is 25.92 kg/(m^2).  General Appearance: Fairly Groomed  Engineer, water::  Fair  Speech:  Clear and Coherent  Volume:  Increased  Mood:  Anxious, Depressed and worried  Affect:  anxious sad worried teary eyed when talking about his mother  Thought Process:  Coherent and Goal Directed  Orientation:  Full (Time, Place, and Person)  Thought Content:  symptoms events worries concerns  Suicidal Thoughts:  Not today  Homicidal Thoughts:  No  Memory:  Immediate;   Fair Recent;   Fair Remote;   Fair  Judgement:  Fair  Insight:  Present  Psychomotor Activity:  Restlessness  Concentration:  Fair  Recall:   [employer] of Knowledge:Fair  Language: Fair  Akathisia:  No  Handed:  Right  AIMS (if indicated):     Assets:  Desire for Improvement  ADL's:  Intact  Cognition: WNL  Sleep:  Number of Hours: 6.5     Current Medications: Current Facility-Administered Medications  Medication Dose Route Frequency Provider Last Rate Last Dose  . acetaminophen (TYLENOL) tablet 650 mg  650 mg Oral Q6H PRN Harriet Butte, NP   650 mg at 01/17/15 1557  . alum & mag hydroxide-simeth (MAALOX/MYLANTA) 200-200-20 MG/5ML suspension 30 mL  30 mL Oral Q4H PRN Harriet Butte, NP   30 mL at 01/17/15 1305  . atomoxetine (STRATTERA) capsule 25 mg  25 mg Oral Daily Nicholaus Bloom, MD   25 mg at 01/18/15 1346  . chlordiazePOXIDE (LIBRIUM) capsule 25 mg  25 mg Oral Q6H PRN Nicholaus Bloom, MD      . chlordiazePOXIDE (LIBRIUM) capsule 25 mg  25 mg Oral QID Nicholaus Bloom, MD   25 mg at 01/18/15 1645   Followed by  . [START ON 01/19/2015] chlordiazePOXIDE (LIBRIUM) capsule 25 mg  25 mg Oral TID Nicholaus Bloom, MD       Followed by  . [START ON 01/20/2015] chlordiazePOXIDE (LIBRIUM) capsule 25 mg  25 mg Oral BH-qamhs Nicholaus Bloom, MD       Followed by  . [START ON 01/21/2015] chlordiazePOXIDE (LIBRIUM) capsule 25 mg  25 mg Oral Daily Nicholaus Bloom, MD      . FLUoxetine (PROZAC) capsule 20 mg  20 mg Oral Daily Nicholaus Bloom, MD   20 mg at 01/18/15 [phone]  . gabapentin (NEURONTIN) capsule 100 mg  100 mg Oral TID Nicholaus Bloom, MD   100 mg at 01/18/15 1645  . hydrOXYzine (ATARAX/VISTARIL) tablet 25 mg  25 mg Oral TID PRN Nicholaus Bloom, MD      . loperamide (IMODIUM) capsule 2-4 mg  2-4 mg Oral PRN Nicholaus Bloom, MD      . magnesium hydroxide (MILK OF MAGNESIA) suspension 30 mL  30 mL Oral Daily PRN Harriet Butte, NP      . multivitamin with minerals tablet 1 tablet  1 tablet Oral Daily Nicholaus Bloom, MD   1 tablet at 01/18/15 [phone]  . nicotine (NICODERM CQ - dosed in mg/24 hours) patch 21 mg  21 mg Transdermal Daily Nicholaus Bloom,  MD   21 mg at 01/18/15 0815  . ondansetron (ZOFRAN) tablet 4 mg  4 mg Oral Q8H PRN Harriet Butte, NP   4 mg at 01/17/15 0405  . pneumococcal 23 valent vaccine (PNU-IMMUNE) injection 0.5 mL  0.5 mL Intramuscular Tomorrow-1000 Nicholaus Bloom, MD   0.5 mL at 01/18/15 0959  . thiamine (VITAMIN B-1) tablet  100 mg  100 mg Oral Daily Nicholaus Bloom, MD   100 mg at 01/18/15 4128  . traZODone (DESYREL) tablet 50 mg  50 mg Oral QHS PRN Harriet Butte, NP      . zolpidem (AMBIEN) tablet 10 mg  10 mg Oral QHS PRN Nicholaus Bloom, MD   10 mg at 01/17/15 2135    Lab Results:  Results for orders placed or performed during the hospital encounter of 01/16/15 (from the past 48 hour(s))  CBC WITH DIFFERENTIAL     Status: None   Collection Time: 01/16/15 11:00 PM  Result Value Ref Range   WBC 8.8 4.0 - 10.5 K/uL   RBC 4.79 4.22 - 5.81 MIL/uL   Hemoglobin 14.8 13.0 - 17.0 g/dL   HCT 44.0 39.0 - 52.0 %   MCV 91.9 78.0 - 100.0 fL   MCH 30.9 26.0 - 34.0 pg   MCHC 33.6 30.0 - 36.0 g/dL   RDW 12.7 11.5 - 15.5 %   Platelets 299 150 - 400 K/uL   Neutrophils Relative % 74 43 - 77 %   Neutro Abs 6.6 1.7 - 7.7 K/uL   Lymphocytes Relative 19 12 - 46 %   Lymphs Abs 1.7 0.7 - 4.0 K/uL   Monocytes Relative 5 3 - 12 %   Monocytes Absolute 0.5 0.1 - 1.0 K/uL   Eosinophils Relative 1 0 - 5 %   Eosinophils Absolute 0.1 0.0 - 0.7 K/uL   Basophils Relative 1 0 - 1 %   Basophils Absolute 0.0 0.0 - 0.1 K/uL  Comprehensive metabolic panel     Status: None   Collection Time: 01/16/15 11:00 PM  Result Value Ref Range   Sodium 140 135 - 145 mmol/L   Potassium 4.2 3.5 - 5.1 mmol/L   Chloride 105 101 - 111 mmol/L   CO2 26 22 - 32 mmol/L   Glucose, Bld 91 65 - 99 mg/dL   BUN 18 6 - 20 mg/dL   Creatinine, Ser 0.94 0.61 - 1.24 mg/dL   Calcium 9.4 8.9 - 10.3 mg/dL   Total Protein 7.5 6.5 - 8.1 g/dL   Albumin 4.3 3.5 - 5.0 g/dL   AST 23 15 - 41 U/L   ALT 17 17 - 63 U/L   Alkaline Phosphatase 57 38 - 126 U/L   Total  Bilirubin 0.5 0.3 - 1.2 mg/dL   GFR calc non Af Amer >60 >60 mL/min   GFR calc Af Amer >60 >60 mL/min    Comment: (NOTE) The eGFR has been calculated using the CKD EPI equation. This calculation has not been validated in all clinical situations. eGFR's persistently <60 mL/min signify possible Chronic Kidney Disease.    Anion gap 9 5 - 15  Ethanol     Status: None   Collection Time: 01/16/15 11:00 PM  Result Value Ref Range   Alcohol, Ethyl (B) <5 <5 mg/dL    Comment:        LOWEST DETECTABLE LIMIT FOR SERUM ALCOHOL IS 11 mg/dL FOR MEDICAL PURPOSES ONLY   Drug screen panel, emergency     Status: Abnormal   Collection Time: 01/16/15 11:13 PM  Result Value Ref Range   Opiates NONE DETECTED NONE DETECTED   Cocaine POSITIVE (A) NONE DETECTED   Benzodiazepines NONE DETECTED NONE DETECTED   Amphetamines NONE DETECTED NONE DETECTED   Tetrahydrocannabinol POSITIVE (A) NONE DETECTED   Barbiturates NONE DETECTED NONE DETECTED    Comment:  DRUG SCREEN FOR MEDICAL PURPOSES ONLY.  IF CONFIRMATION IS NEEDED FOR ANY PURPOSE, NOTIFY LAB WITHIN 5 DAYS.        LOWEST DETECTABLE LIMITS FOR URINE DRUG SCREEN Drug Class       Cutoff (ng/mL) Amphetamine      1000 Barbiturate      200 Benzodiazepine   765 Tricyclics       465 Opiates          300 Cocaine          300 THC              50     Physical Findings: AIMS:  , ,  ,  ,    CIWA:  CIWA-Ar Total: 7 COWS:     Treatment Plan Summary: Daily contact with patient to assess and evaluate symptoms and progress in treatment and Medication management Supportive approach/coping skills Polysubstance dependence; will continue the Librium Detox Depression; will continue the Prozac 20 mg daily and work with CBT/mindfulness Inattentiveness; will start a trial with Strattera that he had used in the past and found useful Anxiety/mood instability; will continue to work with the Neurontin Relapse prevention plan Explore residential  treatment options Medical Decision Making:  Review of Psycho-Social Stressors (1), Review of Medication Regimen & Side Effects (2) and Review of New Medication or Change in Dosage (2)     Jager Koska A 01/18/2015, 5:35 PM

## 2015-01-18 NOTE — [facility] Group Notes (Signed)
[facility] Group Notes:  (Nursing/MHT/Case Management/Adjunct)  Date:  01/18/2015  Time:  1315pm  Type of Therapy:  Nurse Education  Participation Level:  Active  Participation Quality:  Appropriate and Attentive Sharing  Affect:  Appropriate  Cognitive:  Alert and Appropriate  Insight:  Appropriate and Good  Engagement in Group:  Engaged and Supportive  Modes of Intervention:  Discussion, Education and Support  Summary of Progress/Problems: Patient attended group, remained engaged, and responded appropriately when prompted.  Lendell CapriceGuthrie, Erique Kaser A 01/18/2015, 2:04 PM

## 2015-01-19 MED ORDER — ATOMOXETINE HCL 40 MG PO CAPS
40.0000 mg | ORAL_CAPSULE | Freq: Every day | ORAL | Status: DC
  Filled 2015-01-19 (×2): qty 1

## 2015-01-19 NOTE — [facility] Group Notes (Signed)
[facility] Group Notes:  (Clinical Social Work)  01/19/2015  10:00-11:00AM  Summary of Progress/Problems:   The main focus of today's process group was to   1)  discuss the importance of adding supports  2)  define health supports versus unhealthy supports  3)  identify the patient's current unhealthy supports and plan how to handle them  4)  Identify the patient's current healthy supports and plan what to add.  An emphasis was placed on using counselor, doctor, therapy groups, 12-step groups, and problem-specific support groups to expand supports.    The patient expressed full comprehension of the concepts presented, and agreed that there is a need to add more supports.  The patient stated he wants to add supports by going to rehab and returning to NA/AA.  He states he needs a letter for his Engineer, drillingrobation Officer about hospitalization.  Type of Therapy:  Process Group with Motivational Interviewing  Participation Level:  Active  Participation Quality:  Inattentive and Redirectable  Affect:  Blunted  Cognitive:  Alert  Insight:  Limited  Engagement in Therapy:  Limited  Modes of Intervention:   Education, Support and Processing, Activity  Ambrose MantleMareida Grossman-Orr, LCSW 01/19/2015

## 2015-01-19 NOTE — Progress Notes (Addendum)
Patient up and visible in the milieu today. Frequently on the phone making multiple calls. Speech rapid and pressured at times. Rates his depression at a 9/10, anxiety at a 6/10 and denies hopelessness. Reports chronic headaches and currently rates it at an 8/10. Also reports slight nausea. VSS. CIWA is a "4." Patient medicated per orders, support and encouragement offered. He denies SI/HI/AVH and remains safe. Derrick [name], Eagle Pitta Eakes

## 2015-01-19 NOTE — Progress Notes (Signed)
Pt attended group this evening. 

## 2015-01-19 NOTE — [facility] Group Notes (Signed)
[facility] Group Notes:  (Nursing/MHT/Case Management/Adjunct)  Date:  01/19/2015  Time:  9:31 AM  Type of Therapy:  Nurse Education  Participation Level:  Active  Participation Quality:  Redirectable  Affect:  Appropriate  Cognitive:  Alert  Insight:  Appropriate  Engagement in Group:  Distracting  Modes of Intervention:  Discussion  Summary of Progress/Problems: Pt was active in group but at times would cause distraction as he was passing notes to a male pt while in group.  Omelia Blackwaterrdley, Aubrielle Stroud Violon 01/19/2015, 9:31 AM

## 2015-01-19 NOTE — Plan of Care (Signed)
Problem: Ineffective individual coping Goal: STG: Patient will remain free from self harm Outcome: Progressing Although he still endorses passive SI, Pt has remained free of self harm

## 2015-01-19 NOTE — Progress Notes (Signed)
[facility]  01/19/2015 8:19 PM Derrick [name]  MRN:  [id number] Subjective:  Derrick [name] continues to try to get his life back together. Right now he is facing a lot of uncertainty not sure of when is his father getting out of jail and how much support he can have from him. He was started on Strattera and he has been experiencing a headache and feeling hot in his face (VS WNL) The Strattera was the last medication started. He continues to be detox. Principal Problem: Major depressive disorder, recurrent, severe without psychotic behavior Diagnosis:   Patient Active Problem List   Diagnosis Date Noted  . Major depressive disorder, recurrent, severe without psychotic behavior [F33.2] 01/17/2015    Priority: High  . Polysubstance abuse [F19.10] 05/02/2013    Priority: High  . Suicidal ideation [R45.851] 05/02/2013    Priority: High  . ADHD (attention deficit hyperactivity disorder), combined type [F90.2] 01/18/2015    Priority: Medium  . MDD (major depressive disorder) [F32.2] 05/02/2013  . Suicide attempt [T14.91] 05/02/2013  . Impulse control disorder, unspecified [F63.9] 01/04/2013  . Unspecified episodic mood disorder [F39] 01/04/2013   Total Time spent with patient: 30 minutes   Past Medical History:  Past Medical History  Diagnosis Date  . Bipolar 1 disorder   . Substance abuse   . Anxiety   . Hypertension   . Headache(784.0)    History reviewed. No pertinent past surgical history. Family History: History reviewed. No pertinent family history. Social History:  History  Alcohol Use  . 42.0 oz/week  . 70 Cans of beer per week    Comment: everyday drinker      History  Drug Use  . Yes  . Special: Marijuana, "Crack" cocaine, Cocaine    Comment: heroin    History   Social History  . Marital Status: Single    Spouse Name: N/A  . Number of Children: N/A  . Years of Education: N/A   Social History Main Topics  . Smoking status: Current Every Day Smoker -- 2.00  packs/day    Types: Cigarettes  . Smokeless tobacco: Not on file  . Alcohol Use: 42.0 oz/week    70 Cans of beer per week     Comment: everyday drinker   . Drug Use: Yes    Special: Marijuana, "Crack" cocaine, Cocaine     Comment: heroin  . Sexual Activity: Yes   Other Topics Concern  . None   Social History Narrative   Additional History:    Sleep: Fair  Appetite:  Fair   Assessment:   Musculoskeletal: Strength & Muscle Tone: within normal limits Gait & Station: normal Patient leans: normal   Psychiatric Specialty Exam: Physical Exam  Review of Systems  Constitutional: Negative.   HENT:       Hyperemia face ears  Eyes: Negative.   Respiratory: Negative.   Cardiovascular: Negative.   Gastrointestinal: Negative.   Genitourinary: Negative.   Musculoskeletal: Negative.   Skin: Negative.   Neurological: Positive for headaches.  Endo/Heme/Allergies: Negative.   Psychiatric/Behavioral: Positive for depression and substance abuse. The patient is nervous/anxious.     Blood pressure 118/71, pulse 69, temperature 97.5 F (36.4 C), temperature source Oral, resp. rate 17, height 5' 9.5" (1.765 m), weight 80.74 kg (178 lb).Body mass index is 25.92 kg/(m^2).  General Appearance: Fairly Groomed  Patent attorneyye Contact::  Fair  Speech:  Clear and Coherent  Volume:  Normal  Mood:  Anxious and worried  Affect:  anxious worried  Thought Process:  Coherent and Goal Directed  Orientation:  Full (Time, Place, and Person)  Thought Content:  symptomws events worries concerns  Suicidal Thoughts:  No  Homicidal Thoughts:  No  Memory:  Immediate;   Fair Recent;   Fair Remote;   Fair  Judgement:  Fair  Insight:  Present  Psychomotor Activity:  Restlessness  Concentration:  Fair  Recall:  [employer] of Knowledge:Fair  Language: Fair  Akathisia:  No  Handed:  Right  AIMS (if indicated):     Assets:  Desire for Improvement  ADL's:  Intact  Cognition: WNL  Sleep:  Number of Hours: 6      Current Medications: Current Facility-Administered Medications  Medication Dose Route Frequency Provider Last Rate Last Dose  . acetaminophen (TYLENOL) tablet 650 mg  650 mg Oral Q6H PRN Worthy Flank, NP   650 mg at 01/19/15 1634  . alum & mag hydroxide-simeth (MAALOX/MYLANTA) 200-200-20 MG/5ML suspension 30 mL  30 mL Oral Q4H PRN Worthy Flank, NP   30 mL at 01/17/15 1305  . chlordiazePOXIDE (LIBRIUM) capsule 25 mg  25 mg Oral Q6H PRN Rachael Fee, MD      . Melene Muller ON 01/20/2015] chlordiazePOXIDE (LIBRIUM) capsule 25 mg  25 mg Oral BH-qamhs Rachael Fee, MD       Followed by  . [START ON 01/21/2015] chlordiazePOXIDE (LIBRIUM) capsule 25 mg  25 mg Oral Daily Rachael Fee, MD      . FLUoxetine (PROZAC) capsule 20 mg  20 mg Oral Daily Rachael Fee, MD   20 mg at 01/19/15 0817  . gabapentin (NEURONTIN) capsule 100 mg  100 mg Oral TID Rachael Fee, MD   100 mg at 01/19/15 1633  . hydrOXYzine (ATARAX/VISTARIL) tablet 25 mg  25 mg Oral TID PRN Rachael Fee, MD      . loperamide (IMODIUM) capsule 2-4 mg  2-4 mg Oral PRN Rachael Fee, MD      . magnesium hydroxide (MILK OF MAGNESIA) suspension 30 mL  30 mL Oral Daily PRN Worthy Flank, NP      . multivitamin with minerals tablet 1 tablet  1 tablet Oral Daily Rachael Fee, MD   1 tablet at 01/19/15 0817  . nicotine (NICODERM CQ - dosed in mg/24 hours) patch 21 mg  21 mg Transdermal Daily Rachael Fee, MD   21 mg at 01/19/15 0816  . ondansetron (ZOFRAN) tablet 4 mg  4 mg Oral Q8H PRN Worthy Flank, NP   4 mg at 01/19/15 0816  . pneumococcal 23 valent vaccine (PNU-IMMUNE) injection 0.5 mL  0.5 mL Intramuscular Tomorrow-1000 Rachael Fee, MD   0.5 mL at 01/18/15 0959  . thiamine (VITAMIN B-1) tablet 100 mg  100 mg Oral Daily Rachael Fee, MD   100 mg at 01/19/15 4696  . zolpidem (AMBIEN) tablet 10 mg  10 mg Oral QHS PRN Rachael Fee, MD   10 mg at 01/18/15 2132    Lab Results: No results found for this or any previous visit (from  the past 48 hour(s)).  Physical Findings: AIMS:  , ,  ,  ,    CIWA:  CIWA-Ar Total: 4 COWS:     Treatment Plan Summary: Daily contact with patient to assess and evaluate symptoms and progress in treatment and Medication management Supportive approach/coping skills Depression; will continue the Prozac as well as CBT/mindfulness Polysubstance Dependence; will continue Librium detox ADHD; will  D/C the Strattera and will consider adding Wellbutrin Will continue to work a relapse prevention plan  Medical Decision Making:  Review of Psycho-Social Stressors (1), Review of Medication Regimen & Side Effects (2) and Review of New Medication or Change in Dosage (2)     Brighton Delio A 01/19/2015, 8:19 PM

## 2015-01-19 NOTE — Progress Notes (Signed)
D: Pt presents appropriate in affect and mood this evening. Pt observed interacting appropriately with others. Pt is active and visible within the milieu. Pt is currently negative for any SI/HI/AVH. Pt reports to be progressing well with his programming. Pt reports some ongoing nausea but is denying the need for anything at this moment.  A: Ambien administered to aid with sleep. Continued support and availability as needed was extended to this pt. Staff continue to monitor pt with q3915min checks.  R: No adverse drug reactions noted. Pt receptive to treatment. Pt verbally contracts for safety. Pt remains safe at this time.

## 2015-01-20 MED ORDER — HYDROXYZINE HCL 25 MG PO TABS: 25.0000 mg | ORAL_TABLET | Freq: Four times a day (QID) | ORAL | Status: DC | PRN

## 2015-01-20 NOTE — [facility] Group Notes (Addendum)
[facility]  01/20/2015 8:45 AM  Participation Quality: Alert, Appropriate and Oriented  Mood/Affect:   Depression Rating: 6  Anxiety Rating: 9  Thoughts of Suicide: Pt denies SI/HI  Will you contract for safety? Yes  Current AVH: Pt denies  Plan for Discharge/Comments: Pt attended discharge planning group and actively participated in group. CSW provided pt with today's workbook. Pt participated appropriately in group discussion. He expressed his desire to go to residential treatment for substance abuse at discharge. [facility] such [facility].   Transportation Means: Pt reports access to transportation  Supports: No supports mentioned at this time  Chad CordialLauren Carter, LCSWA 01/20/2015 12:06 PM

## 2015-01-20 NOTE — Progress Notes (Signed)
Pt has been in the dayroom talking with another pt most of the evening.  He was observed talking on the phone a couple of times.  He reports that he wants to go to long term treatment after his discharge.  He denies SI/HI/AV.  He reports he is still having a tough time dealing with the death of his mother.  He said she was very sick for a while before her death and that he had watched her waste away from cancer.  He really wants to get his life back and maybe go back to school.  He is pleasant and cooperative.  He makes his needs known to staff.  He denies any withdrawal symptoms at this time.  He did c/o nausea, but attributed it to indigestion.  He was given Maalox and ginger ale to drink.  Support and encouragement offered.  Safety maintained with q15 minute checks.

## 2015-01-20 NOTE — Progress Notes (Signed)
[facility] MD Progress Note  01/20/2015 5:39 PM Derrick [name]  MRN:  [id number] Subjective:  Continues to have a hard time trying to get his life back together. Seems to have had a reaction to the Strattera. Off it but concerned about his  ADHD. States he would eventually like to go back to school and would need something to help him. His father might be out of jail he does not know for sure. He would like to keep the medications he is taking at the same low dosages. Concerned about side effects and adverse reactions. Would still like to go to rehab as states he is afraid of relapsing. States that this is the first time since his mother died that he is thinking rationally and does not want to go backwards Principal Problem: Major depressive disorder, recurrent, severe without psychotic behavior Diagnosis:   Patient Active Problem List   Diagnosis Date Noted  . Major depressive disorder, recurrent, severe without psychotic behavior [F33.2] 01/17/2015    Priority: High  . Polysubstance abuse [F19.10] 05/02/2013    Priority: High  . Suicidal ideation [R45.851] 05/02/2013    Priority: High  . ADHD (attention deficit hyperactivity disorder), combined type [F90.2] 01/18/2015    Priority: Medium  . MDD (major depressive disorder) [F32.2] 05/02/2013  . Suicide attempt [T14.91] 05/02/2013  . Impulse control disorder, unspecified [F63.9] 01/04/2013  . Unspecified episodic mood disorder [F39] 01/04/2013   Total Time spent with patient: 30 minutes   Past Medical History:  Past Medical History  Diagnosis Date  . Bipolar 1 disorder   . Substance abuse   . Anxiety   . Hypertension   . Headache(784.0)    History reviewed. No pertinent past surgical history. Family History: History reviewed. No pertinent family history. Social History:  History  Alcohol Use  . 42.0 oz/week  . 70 Cans of beer per week    Comment: everyday drinker      History  Drug Use  . Yes  . Special: Marijuana, "Crack"  cocaine, Cocaine    Comment: heroin    History   Social History  . Marital Status: Single    Spouse Name: N/A  . Number of Children: N/A  . Years of Education: N/A   Social History Main Topics  . Smoking status: Current Every Day Smoker -- 2.00 packs/day    Types: Cigarettes  . Smokeless tobacco: Not on file  . Alcohol Use: 42.0 oz/week    70 Cans of beer per week     Comment: everyday drinker   . Drug Use: Yes    Special: Marijuana, "Crack" cocaine, Cocaine     Comment: heroin  . Sexual Activity: Yes   Other Topics Concern  . None   Social History Narrative   Additional History:    Sleep: Fair  Appetite:  Fair   Assessment:   Musculoskeletal: Strength & Muscle Tone: within normal limits Gait & Station: normal Patient leans: N/A   Psychiatric Specialty Exam: Physical Exam  Review of Systems  Constitutional: Negative.   HENT: Negative.   Eyes: Negative.   Respiratory: Negative.   Cardiovascular: Negative.   Gastrointestinal: Negative.   Genitourinary: Negative.   Musculoskeletal: Negative.   Skin: Negative.   Neurological: Negative.   Endo/Heme/Allergies: Negative.   Psychiatric/Behavioral: Positive for depression and substance abuse. The patient is nervous/anxious.     Blood pressure 123/71, pulse 82, temperature 98 F (36.7 C), temperature source Oral, resp. rate 16, height 5' 9.5" (1.765 m), weight  80.74 kg (178 lb), SpO2 100 %.Body mass index is 25.92 kg/(m^2).  General Appearance: Fairly Groomed  Patent attorneyye Contact::  Fair  Speech:  Clear and Coherent  Volume:  Normal  Mood:  Anxious, Depressed and worried  Affect:  anxious worried  Thought Process:  Coherent and Goal Directed  Orientation:  Full (Time, Place, and Person)  Thought Content:  symptoms events worries concerns  Suicidal Thoughts:  No  Homicidal Thoughts:  No  Memory:  Immediate;   Fair Recent;   Fair Remote;   Fair  Judgement:  Fair  Insight:  Present and Shallow  Psychomotor  Activity:  Restlessness  Concentration:  Fair  Recall:  [employer]iservFair  Fund of Knowledge:Fair  Language: Fair  Akathisia:  No  Handed:  Right  AIMS (if indicated):     Assets:  Desire for Improvement  ADL's:  Intact  Cognition: WNL  Sleep:  Number of Hours: 6.25     Current Medications: Current Facility-Administered Medications  Medication Dose Route Frequency Provider Last Rate Last Dose  . acetaminophen (TYLENOL) tablet 650 mg  650 mg Oral Q6H PRN Worthy FlankIjeoma E Nwaeze, NP   650 mg at 01/20/15 1704  . alum & mag hydroxide-simeth (MAALOX/MYLANTA) 200-200-20 MG/5ML suspension 30 mL  30 mL Oral Q4H PRN Worthy FlankIjeoma E Nwaeze, NP   30 mL at 01/20/15 0846  . chlordiazePOXIDE (LIBRIUM) capsule 25 mg  25 mg Oral BH-qamhs Rachael FeeIrving A Wanetta Funderburke, MD   25 mg at 01/20/15 0755   Followed by  . [START ON 01/21/2015] chlordiazePOXIDE (LIBRIUM) capsule 25 mg  25 mg Oral Daily Rachael FeeIrving A Rickayla Wieland, MD      . FLUoxetine (PROZAC) capsule 20 mg  20 mg Oral Daily Rachael FeeIrving A Arbell Wycoff, MD   20 mg at 01/20/15 [id number]  . gabapentin (NEURONTIN) capsule 100 mg  100 mg Oral TID Rachael FeeIrving A Dorotea Hand, MD   100 mg at 01/20/15 1704  . hydrOXYzine (ATARAX/VISTARIL) tablet 25 mg  25 mg Oral TID PRN Rachael FeeIrving A Marckus Hanover, MD      . magnesium hydroxide (MILK OF MAGNESIA) suspension 30 mL  30 mL Oral Daily PRN Worthy FlankIjeoma E Nwaeze, NP      . multivitamin with minerals tablet 1 tablet  1 tablet Oral Daily Rachael FeeIrving A Maisey Deandrade, MD   1 tablet at 01/20/15 [phone]  . nicotine (NICODERM CQ - dosed in mg/24 hours) patch 21 mg  21 mg Transdermal Daily Rachael FeeIrving A Brita Jurgensen, MD   21 mg at 01/20/15 0755  . ondansetron (ZOFRAN) tablet 4 mg  4 mg Oral Q8H PRN Worthy FlankIjeoma E Nwaeze, NP   4 mg at 01/20/15 0846  . pneumococcal 23 valent vaccine (PNU-IMMUNE) injection 0.5 mL  0.5 mL Intramuscular Tomorrow-1000 Rachael FeeIrving A Erian Lariviere, MD   0.5 mL at 01/18/15 0959  . thiamine (VITAMIN B-1) tablet 100 mg  100 mg Oral Daily Rachael FeeIrving A Fredrica Capano, MD   100 mg at 01/20/15 [id number]  . zolpidem (AMBIEN) tablet 10 mg  10 mg Oral QHS PRN Rachael FeeIrving A Kendi Defalco,  MD   10 mg at 01/19/15 2120    Lab Results: No results found for this or any previous visit (from the past 48 hour(s)).  Physical Findings: AIMS:  , ,  ,  ,    CIWA:  CIWA-Ar Total: 4 COWS:     Treatment Plan Summary: Daily contact with patient to assess and evaluate symptoms and progress in treatment and Medication management Supportive approach/coping skills Polysubstance dependence; continue the detox and work a relapse prevention plan  Depression; continue the Prozac 20 mg Anxiety continue the Neurontin 100 mg TID and renew  the Vistaril order ADHD: Strattera was D/C will hold starting another medication for the time being ( Wellbutrin could be an option)  Medical Decision Making:  Review of Psycho-Social Stressors (1), Review of Medication Regimen & Side Effects (2) and Review of New Medication or Change in Dosage (2)     Josslynn Mentzer A 01/20/2015, 5:39 PM

## 2015-01-20 NOTE — Plan of Care (Signed)
Problem: Alteration in mood & ability to function due to Goal: LTG-Patient demonstrates decreased signs of withdrawal (Patient demonstrates decreased signs of withdrawal to the point the patient is safe to return home and continue treatment in an outpatient setting)  Outcome: Progressing Pt reports a decrease in the severity of his nausea. Pt declined the need for a prn dose of Zofran.

## 2015-01-20 NOTE — Plan of Care (Signed)
Problem: Alteration in mood Goal: STG-Patient reports thoughts of self-harm to staff Outcome: Progressing Patient is denying SI or thoughts of self harm  Problem: Diagnosis: Increased Risk For Suicide Attempt Goal: STG-Patient Will Comply With Medication Regime Outcome: Progressing Patient is med compliant and able to verbalize need for prn's.

## 2015-01-20 NOTE — Progress Notes (Addendum)
Patient up and visible in the milieu. Still complaining of nausea and headaches but has not vomited. States the headache is chronic and behind his eyes and he knows he needs to have further evaluation after discharge as this issue has been present for awhile. Rating his depression at an 8/10, hopelessness at a 10/10 and anxiety at a 3/10. Medicated per orders. Tylenol, maalox and zofran given per request. Patient offered support, med education and education regarding withdrawal and detox process. Patient verbalized understanding. On reassess he denies pain and nausea/indigestion are improved. He denies SI/HI/AVH to this Clinical research associatewriter however on his self inventory indicates passive SI at times. Subsequently contracted verbally with this Clinical research associatewriter. He remains safe. Derrick [name], Phuc Kluttz Eakes

## 2015-01-21 MED ORDER — AMANTADINE HCL 100 MG PO CAPS
100.0000 mg | ORAL_CAPSULE | Freq: Two times a day (BID) | ORAL | Status: DC
  Administered 2015-01-21 – 2015-01-22 (×3): 100 mg via ORAL
  Filled 2015-01-21 (×3): qty 1
  Filled 2015-01-21: qty 42
  Filled 2015-01-21 (×4): qty 1
  Filled 2015-01-21: qty 42

## 2015-01-21 MED ORDER — CLONIDINE HCL ER 0.1 MG PO TB12: 0.1000 mg | ORAL_TABLET | ORAL | Status: DC

## 2015-01-21 MED ORDER — CLONIDINE HCL ER 0.1 MG PO TB12
0.1000 mg | ORAL_TABLET | Freq: Every day | ORAL | Status: DC
  Administered 2015-01-21 – 2015-01-23 (×3): 0.1 mg via ORAL
  Filled 2015-01-21 (×2): qty 1
  Filled 2015-01-21: qty 21
  Filled 2015-01-21: qty 1

## 2015-01-21 NOTE — Plan of Care (Signed)
Problem: Alteration in mood; excessive anxiety as evidenced by: Goal: STG-Pt can identify coping skills to manage panic/anxiety (Patient can identify at least ____ coping skills to manage panic/anxiety attack)  Outcome: Progressing Patient active in discussion regarding appropriate coping skills.  Problem: Diagnosis: Increased Risk For Suicide Attempt Goal: STG-Patient Will Report Suicidal Feelings to Staff Outcome: Progressing Patient states he is having SI "on and off" but verbally contracts for safety.

## 2015-01-21 NOTE — Progress Notes (Signed)
[facility] MD Progress Note  01/21/2015 5:32 PM Emerson Monteimothy J Wurm  MRN:  [id number] Subjective:  Marcial Pacasimothy continues to try to get his life back together. Says he is really serious. Learned that Symmetrel could help with cravings of cocaine and asked to give it a try. He wants also help with the ADHD and would like a trial with another medication now that he is not going to be using the Strattera. He is still committed in going to a residential treatment program.  Principal Problem: Major depressive disorder, recurrent, severe without psychotic behavior Diagnosis:   Patient Active Problem List   Diagnosis Date Noted  . Major depressive disorder, recurrent, severe without psychotic behavior [F33.2] 01/17/2015    Priority: High  . Polysubstance abuse [F19.10] 05/02/2013    Priority: High  . Suicidal ideation [R45.851] 05/02/2013    Priority: High  . ADHD (attention deficit hyperactivity disorder), combined type [F90.2] 01/18/2015    Priority: Medium  . MDD (major depressive disorder) [F32.2] 05/02/2013  . Suicide attempt [T14.91] 05/02/2013  . Impulse control disorder, unspecified [F63.9] 01/04/2013  . Unspecified episodic mood disorder [F39] 01/04/2013   Total Time spent with patient: 30 minutes   Past Medical History:  Past Medical History  Diagnosis Date  . Bipolar 1 disorder   . Substance abuse   . Anxiety   . Hypertension   . Headache(784.0)    History reviewed. No pertinent past surgical history. Family History: History reviewed. No pertinent family history. Social History:  History  Alcohol Use  . 42.0 oz/week  . 70 Cans of beer per week    Comment: everyday drinker      History  Drug Use  . Yes  . Special: Marijuana, "Crack" cocaine, Cocaine    Comment: heroin    History   Social History  . Marital Status: Single    Spouse Name: N/A  . Number of Children: N/A  . Years of Education: N/A   Social History Main Topics  . Smoking status: Current Every Day Smoker -- 2.00  packs/day    Types: Cigarettes  . Smokeless tobacco: Not on file  . Alcohol Use: 42.0 oz/week    70 Cans of beer per week     Comment: everyday drinker   . Drug Use: Yes    Special: Marijuana, "Crack" cocaine, Cocaine     Comment: heroin  . Sexual Activity: Yes   Other Topics Concern  . None   Social History Narrative   Additional History:    Sleep: Fair  Appetite:  Fair   Assessment:   Musculoskeletal: Strength & Muscle Tone: within normal limits Gait & Station: normal Patient leans: N/A   Psychiatric Specialty Exam: Physical Exam  Review of Systems  Constitutional: Negative.   HENT: Negative.   Eyes: Negative.   Respiratory: Negative.   Cardiovascular: Negative.   Gastrointestinal: Negative.   Genitourinary: Negative.   Musculoskeletal: Negative.   Skin: Negative.   Neurological: Negative.   Endo/Heme/Allergies: Negative.   Psychiatric/Behavioral: Positive for depression and substance abuse. The patient is nervous/anxious.     Blood pressure 116/82, pulse 67, temperature 98.5 F (36.9 C), temperature source Oral, resp. rate 18, height 5' 9.5" (1.765 m), weight 80.74 kg (178 lb), SpO2 100 %.Body mass index is 25.92 kg/(m^2).  General Appearance: Fairly Groomed  Patent attorneyye Contact::  Fair  Speech:  Clear and Coherent and Pressured  Volume:  fluctuates  Mood:  Anxious and worried  Affect:  anxious worried  Thought Process:  Coherent  and Goal Directed  Orientation:  Full (Time, Place, and Person)  Thought Content:  symptoms events worries concerns  Suicidal Thoughts:  No  Homicidal Thoughts:  No  Memory:  Immediate;   Fair Recent;   Fair Remote;   Fair  Judgement:  Fair  Insight:  Present  Psychomotor Activity:  Restlessness  Concentration:  Fair  Recall:  [employer] of Knowledge:Fair  Language: Fair  Akathisia:  No  Handed:  Right  AIMS (if indicated):     Assets:  Desire for Improvement  ADL's:  Intact  Cognition: WNL  Sleep:  Number of Hours: 6      Current Medications: Current Facility-Administered Medications  Medication Dose Route Frequency Provider Last Rate Last Dose  . acetaminophen (TYLENOL) tablet 650 mg  650 mg Oral Q6H PRN Worthy Flank, NP   650 mg at 01/21/15 0808  . alum & mag hydroxide-simeth (MAALOX/MYLANTA) 200-200-20 MG/5ML suspension 30 mL  30 mL Oral Q4H PRN Worthy Flank, NP   30 mL at 01/21/15 0810  . amantadine (SYMMETREL) capsule 100 mg  100 mg Oral BID Rachael Fee, MD   100 mg at 01/21/15 1653  . cloNIDine HCl (KAPVAY) ER tablet 0.1 mg  0.1 mg Oral QHS Rachael Fee, MD      . FLUoxetine (PROZAC) capsule 20 mg  20 mg Oral Daily Rachael Fee, MD   20 mg at 01/21/15 0808  . gabapentin (NEURONTIN) capsule 100 mg  100 mg Oral TID Rachael Fee, MD   100 mg at 01/21/15 1653  . hydrOXYzine (ATARAX/VISTARIL) tablet 25 mg  25 mg Oral TID PRN Rachael Fee, MD      . magnesium hydroxide (MILK OF MAGNESIA) suspension 30 mL  30 mL Oral Daily PRN Worthy Flank, NP      . multivitamin with minerals tablet 1 tablet  1 tablet Oral Daily Rachael Fee, MD   1 tablet at 01/21/15 [phone]  . nicotine (NICODERM CQ - dosed in mg/24 hours) patch 21 mg  21 mg Transdermal Daily Rachael Fee, MD   21 mg at 01/21/15 0809  . ondansetron (ZOFRAN) tablet 4 mg  4 mg Oral Q8H PRN Worthy Flank, NP   4 mg at 01/20/15 0846  . pneumococcal 23 valent vaccine (PNU-IMMUNE) injection 0.5 mL  0.5 mL Intramuscular Tomorrow-1000 Rachael Fee, MD   0.5 mL at 01/18/15 0959  . thiamine (VITAMIN B-1) tablet 100 mg  100 mg Oral Daily Rachael Fee, MD   100 mg at 01/21/15 9604  . zolpidem (AMBIEN) tablet 10 mg  10 mg Oral QHS PRN Rachael Fee, MD   10 mg at 01/20/15 2225    Lab Results: No results found for this or any previous visit (from the past 48 hour(s)).  Physical Findings: AIMS:  , ,  ,  ,    CIWA:  CIWA-Ar Total: 4 COWS:     Treatment Plan Summary: Daily contact with patient to assess and evaluate symptoms and progress in  treatment and Medication management Supportive approach/coping skills Cocaine dependence; will use Symmetrel for cocaine cravings ADHD ;will try Kapvay ER 0.1 mg at HS Depression; will continue the Prozac Will work a relapse prevention plan Will explore residential treatment options  Medical Decision Making:  Review of Psycho-Social Stressors (1), Review of Medication Regimen & Side Effects (2) and Review of New Medication or Change in Dosage (2)     Karron Alvizo A  01/21/2015, 5:32 PM

## 2015-01-21 NOTE — [facility] Group Notes (Signed)
[facility] Group Notes:  (Nursing/MHT/Case Management/Adjunct)  Date:  01/21/2015  Time:  10:03 AM  Type of Therapy:  Nurse Education  Participation Level:  Active  Participation Quality:  Appropriate  Affect:  Blunted  Cognitive:  Alert and Appropriate  Insight:  Improving  Engagement in Group:  Engaged  Modes of Intervention:  Discussion and Education  Summary of Progress/Problems:  The topic of discussion today was Recovery. Patient's were encouraged to focus on sleep hygiene, gaining and utilizing coping skills. Patient attended group and participated. Patient reported that nicotine does not bother his sleep schedule however he knows not to drink caffeine before bed.   Leoda Smithhart E 01/21/2015, 10:03 AM

## 2015-01-21 NOTE — Progress Notes (Signed)
Pt reports his day has been ok, but not great.  He reports that he had some med changes today, because he was having some side effects from the strattera.  He also said he had a toothache earlier today, but that it was not bothering him this evening.  He is hopeful to get into a long term treatment program and will probably discharge by Friday.  He is on probation and wants to stay out of jail.  He seems sincere in wanting to turn his life around.  He says he has passive suicidal thoughts off and on, but he can contract for safety.  He denies HI/AVH.  He makes his needs known to staff.  He is pleasant and appropriate on the unit.  Support and encouragement offered.  Safety maintained with q15 minute checks.

## 2015-01-21 NOTE — Progress Notes (Signed)
Patient's status remains relatively unchanged. He continues to report anxiety and depression. Rates them both at an 8/10 as well as his hopelessness. "I'm just dealing with a lot." Continues to complain of persistent headaches. "Let's not call them headaches. They are migraines. I wish I could stick my head in a meat grinder." Patient also complaining of heartburn and states he plans to have both issues checked once he obtains his orange card with the help of CM. Patient offered emotional support. Patient medicated per orders. Given tylenol and maalox for complaints. On reassess, patient indicates relief. He is participating in treatment and behavior is appropriate. He does endorse passive SI but contracts for safety. No HI/AVH. Lawrence MarseillesFriedman, Rhen Dossantos Eakes

## 2015-01-21 NOTE — Clinical Social Work Note (Signed)
At patient's request, CSW spoke with his probation officer Maryella ShiversChelsea Hayes [phone] to update her on treatment team's recommendation of residential treatment.  Samuella BruinKristin Amabel Stmarie, MSW, [employer] IncLCSWA Clinical Social Worker [facility] [phone]

## 2015-01-21 NOTE — Clinical Social Work Note (Signed)
Patient remains on [facility] waitlist, [facility]- awaiting return call.  Samuella BruinKristin Julious Langlois, MSW, [employer] IncLCSWA Clinical Social Worker [facility] [phone]

## 2015-01-21 NOTE — Tx Team (Signed)
Interdisciplinary Treatment Plan Update (Adult) Date: 01/21/2015   Time Reviewed: 9:30 AM  Progress in Treatment: Attending groups: Yes Participating in groups: Yes Taking medication as prescribed: Yes Tolerating medication: Yes Family/Significant other contact made: No, patient has declined collateral contact Patient understands diagnosis: Yes Discussing patient identified problems/goals with staff: Yes Medical problems stabilized or resolved: Yes Denies suicidal/homicidal ideation: Yes Issues/concerns per patient self-inventory: Yes Other:  New problem(s) identified: N/A  Discharge Plan or Barriers:  5/27: CSW continuing to assess, patient new to milieu. Has multiple previous admissions, most recently in 04/2013.  5/31: Patient requesting residential treatment. [facility], and [facility] screening scheduled for 6/6.  Reason for Continuation of Hospitalization:  Depression Anxiety Medication Stabilization   Comments: N/A  Estimated length of stay: 2-3 days  For review of initial/current patient goals, please see plan of care.  Patient is a 29 year old Male admitted for polysubstance abuse and SI with plan to hang self or overdose. [facility]. Patient will benefit from crisis stabilization, medication evaluation, group therapy, and psycho education in addition to case management for discharge planning. Patient and CSW reviewed pt's identified goals and treatment plan. Pt verbalized understanding and agreed to treatment plan.   Attendees: Patient:    Family:    Physician: Dr. Jama Flavorsobos; Dr. Dub MikesLugo; Dr. Elna BreslowEappen 01/21/2015 9:30 AM  Nursing: Orlie PollenMarion Friedman, Quintella ReichertBeverly Knight, Christa Dopeson, RN 01/21/2015 9:30 AM  Clinical Social Worker: Samuella BruinKristin Cameren Earnest, Chad CordialLauren Carter  LCSWA 01/21/2015 9:30 AM  Other: Juline PatchQuylle Hodnett, LCSW 01/21/2015 9:30 AM  Other: Leisa LenzValerie Enoch, Vesta MixerMonarch Liaison 01/21/2015 9:30 AM  Other: Onnie BoerJennifer Clark, Case Manager  01/21/2015 9:30 AM  Other: Mosetta AnisAggie Nwoko, Laura Davis, NP 01/21/2015 9:30 AM  Other:    Other:    Other:    Other:      Scribe for Treatment Team:  Samuella BruinKristin Alvina Strother, MSW, [employer]LCSWA [phone]

## 2015-01-21 NOTE — Progress Notes (Signed)
Recreation Therapy Notes  Animal-Assisted Activity (AAA) Program Checklist/Progress Notes Patient Eligibility Criteria Checklist & Daily Group note for Rec Tx Intervention  Date: 05.31.16 Time: 2:30pm [address]   AAA/T Program Assumption of Risk Form signed by Patient/ or Parent Legal Guardian yes  Patient is free of allergies or sever asthma yes  Patient reports no fear of animals yes  Patient reports no history of cruelty to animalsyes  Patient understands his/her participation is voluntary yes  Patient washes hands before animal contact yes  Patient washes hands after animal contact yes  Behavioral Response: Engaged  Education: Charity fundraiserHand Washing, Appropriate Animal Interaction   Education Outcome: Acknowledges understanding/In group clarification offered/Needs additional education.   Clinical Observations/Feedback: Patient sat on the floor and pet the dog.  Patient also asked questions and talked with peers.   Derrick [name], LRT/CTRS         Derrick [name], Quintessa Simmerman A 01/21/2015 4:42 PM

## 2015-01-21 NOTE — [facility] Group Notes (Signed)
[facility] LCSW Group Therapy  01/21/2015   1:15 PM   Type of Therapy:  Group Therapy  Participation Level:  Active  Participation Quality:  Attentive, Sharing and Supportive  Affect:  Manic  Cognitive:  Alert and Oriented  Insight:  Developing/Improving and Engaged  Engagement in Therapy:  Developing/Improving and Engaged  Modes of Intervention:  Clarification, Confrontation, Discussion, Education, Exploration, Limit-setting, Orientation, Problem-solving, Rapport Building, Dance movement psychotherapisteality Testing, Socialization and Support  Summary of Progress/Problems: The topic for group therapy was feelings about diagnosis.  Pt actively participated in group discussion on their past and current diagnosis and how they feel towards this.  Pt also identified how society and family members judge them, based on their diagnosis as well as stereotypes and stigmas. Patient discussed his unhealthy relationship with his father and the need to set appropriate boundaries. Patient shared that he tends to be "paranoid" so is selective with those that he chooses to disclose his diagnoses with. He identifies lack of motivation and energy to be challenging for him and discussed his mother's death and his role as a caregiver for her. Patient reports that his motivation to get into recovery is "to stay out of jail". Patient reports having one positive support in his life that he plans to stay with at discharge if he is unable to get into treatment center. CSW provided patient with emotional support and encouragement.  Samuella BruinKristin Eliani Leclere, MSW, [employer]CSWA Clinical Social Worker [facility] [phone]

## 2015-01-22 MED ORDER — IBUPROFEN 800 MG PO TABS
ORAL_TABLET | ORAL | Status: AC
  Administered 2015-01-22: 12:00:00
  Filled 2015-01-22: qty 1

## 2015-01-22 MED ORDER — IBUPROFEN 800 MG PO TABS
800.0000 mg | ORAL_TABLET | Freq: Three times a day (TID) | ORAL | Status: DC | PRN
  Administered 2015-01-22 – 2015-01-23 (×3): 800 mg via ORAL
  Filled 2015-01-22 (×3): qty 1

## 2015-01-22 NOTE — Progress Notes (Signed)
[facility] MD Progress Note  01/22/2015 5:11 PM Derrick [name]  MRN:  [id number] Subjective:  [name] states that the Kapvay and the Symmetrel have not caused any new side effects. He is dealing with a tooth ache but states it is not as a bad at the moment of this evaluation. He states he wants to do long term up to 6 months. States he really wants to be succesful this time aroung. States he has his goals very clear and will not let anything get in the way of his goals. He wants to be a goof father to his kid Principal Problem: Major depressive disorder, recurrent, severe without psychotic behavior Diagnosis:   Patient Active Problem List   Diagnosis Date Noted  . Major depressive disorder, recurrent, severe without psychotic behavior [F33.2] 01/17/2015    Priority: High  . Polysubstance abuse [F19.10] 05/02/2013    Priority: High  . Suicidal ideation [R45.851] 05/02/2013    Priority: High  . ADHD (attention deficit hyperactivity disorder), combined type [F90.2] 01/18/2015    Priority: Medium  . MDD (major depressive disorder) [F32.2] 05/02/2013  . Suicide attempt [T14.91] 05/02/2013  . Impulse control disorder, unspecified [F63.9] 01/04/2013  . Unspecified episodic mood disorder [F39] 01/04/2013   Total Time spent with patient: 30 minutes   Past Medical History:  Past Medical History  Diagnosis Date  . Bipolar 1 disorder   . Substance abuse   . Anxiety   . Hypertension   . Headache(784.0)    History reviewed. No pertinent past surgical history. Family History: History reviewed. No pertinent family history. Social History:  History  Alcohol Use  . 42.0 oz/week  . 70 Cans of beer per week    Comment: everyday drinker      History  Drug Use  . Yes  . Special: Marijuana, "Crack" cocaine, Cocaine    Comment: heroin    History   Social History  . Marital Status: Single    Spouse Name: N/A  . Number of Children: N/A  . Years of Education: N/A   Social History Main Topics   . Smoking status: Current Every Day Smoker -- 2.00 packs/day    Types: Cigarettes  . Smokeless tobacco: Not on file  . Alcohol Use: 42.0 oz/week    70 Cans of beer per week     Comment: everyday drinker   . Drug Use: Yes    Special: Marijuana, "Crack" cocaine, Cocaine     Comment: heroin  . Sexual Activity: Yes   Other Topics Concern  . None   Social History Narrative   Additional History:    Sleep: Fair  Appetite:  Fair   Assessment:   Musculoskeletal: Strength & Muscle Tone: within normal limits Gait & Station: normal Patient leans: N/A   Psychiatric Specialty Exam: Physical Exam  Review of Systems  Constitutional: Negative.   HENT: Negative.   Eyes: Negative.   Respiratory: Negative.   Cardiovascular: Negative.   Gastrointestinal: Negative.   Genitourinary: Negative.   Musculoskeletal: Negative.   Skin: Negative.   Neurological: Negative.   Endo/Heme/Allergies: Negative.   Psychiatric/Behavioral: Positive for depression and substance abuse. The patient is nervous/anxious.     Blood pressure 118/58, pulse 71, temperature 97.5 F (36.4 C), temperature source Oral, resp. rate 18, height 5' 9.5" (1.765 m), weight 80.74 kg (178 lb), SpO2 100 %.Body mass index is 25.92 kg/(m^2).  General Appearance: Fairly Groomed  Patent attorney::  Fair  Speech:  Clear and Coherent  Volume:  Normal  Mood:  Anxious  Affect:  anxious worried  Thought Process:  Coherent and Goal Directed  Orientation:  Full (Time, Place, and Person)  Thought Content:  symptoms events worries concerns  Suicidal Thoughts:  No  Homicidal Thoughts:  No  Memory:  Immediate;   Fair Recent;   Fair Remote;   Fair  Judgement:  Fair  Insight:  Present  Psychomotor Activity:  Restlessness  Concentration:  Fair  Recall:  [employer]Fair  Fund of Knowledge:Fair  Language: Fair  Akathisia:  No  Handed:  Right  AIMS (if indicated):     Assets:  Desire for Improvement  ADL's:  Intact  Cognition: WNL  Sleep:   Number of Hours: 5.5     Current Medications: Current Facility-Administered Medications  Medication Dose Route Frequency Provider Last Rate Last Dose  . acetaminophen (TYLENOL) tablet 650 mg  650 mg Oral Q6H PRN Worthy FlankIjeoma E Nwaeze, NP   650 mg at 01/22/15 0752  . alum & mag hydroxide-simeth (MAALOX/MYLANTA) 200-200-20 MG/5ML suspension 30 mL  30 mL Oral Q4H PRN Worthy FlankIjeoma E Nwaeze, NP   30 mL at 01/22/15 0753  . amantadine (SYMMETREL) capsule 100 mg  100 mg Oral BID Rachael FeeIrving A Harlin Mazzoni, MD   100 mg at 01/22/15 1646  . cloNIDine HCl (KAPVAY) ER tablet 0.1 mg  0.1 mg Oral QHS Rachael FeeIrving A Caylah Plouff, MD   0.1 mg at 01/21/15 2158  . FLUoxetine (PROZAC) capsule 20 mg  20 mg Oral Daily Rachael FeeIrving A Amarii Bordas, MD   20 mg at 01/22/15 0751  . gabapentin (NEURONTIN) capsule 100 mg  100 mg Oral TID Rachael FeeIrving A Catrice Zuleta, MD   100 mg at 01/22/15 1646  . hydrOXYzine (ATARAX/VISTARIL) tablet 25 mg  25 mg Oral TID PRN Rachael FeeIrving A Sydne Krahl, MD      . ibuprofen (ADVIL,MOTRIN) tablet 800 mg  800 mg Oral Q8H PRN Rachael FeeIrving A Kali Deadwyler, MD      . magnesium hydroxide (MILK OF MAGNESIA) suspension 30 mL  30 mL Oral Daily PRN Worthy FlankIjeoma E Nwaeze, NP      . multivitamin with minerals tablet 1 tablet  1 tablet Oral Daily Rachael FeeIrving A Dann Ventress, MD   1 tablet at 01/22/15 0751  . nicotine (NICODERM CQ - dosed in mg/24 hours) patch 21 mg  21 mg Transdermal Daily Rachael FeeIrving A Jowell Bossi, MD   21 mg at 01/22/15 0751  . ondansetron (ZOFRAN) tablet 4 mg  4 mg Oral Q8H PRN Worthy FlankIjeoma E Nwaeze, NP   4 mg at 01/20/15 0846  . pneumococcal 23 valent vaccine (PNU-IMMUNE) injection 0.5 mL  0.5 mL Intramuscular Tomorrow-1000 Rachael FeeIrving A Quanah Majka, MD   0.5 mL at 01/18/15 0959  . thiamine (VITAMIN B-1) tablet 100 mg  100 mg Oral Daily Rachael FeeIrving A Jayr Lupercio, MD   100 mg at 01/22/15 0751  . zolpidem (AMBIEN) tablet 10 mg  10 mg Oral QHS PRN Rachael FeeIrving A Shakhia Gramajo, MD   10 mg at 01/21/15 2158    Lab Results: No results found for this or any previous visit (from the past 48 hour(s)).  Physical Findings: AIMS:  , ,  ,  ,    CIWA:   CIWA-Ar Total: 4 COWS:     Treatment Plan Summary: Daily contact with patient to assess and evaluate symptoms and progress in treatment and Medication management Supportive approach/coping skills Depression; continue to work with the Prozac Polysubstance dependence; use Symmetrel for cocaine cravings ADHD; optimize response to the Kapvay  Medical Decision Making:  Review of Psycho-Social Stressors (1),  Review of Medication Regimen & Side Effects (2) and Review of New Medication or Change in Dosage (2)     Edgar Corrigan A 01/22/2015, 5:11 PM

## 2015-01-22 NOTE — Progress Notes (Signed)
D   Pt is pleasant on approach and cooperative   He reports having a not so good day and hopes tomorrow is better   He said he can be ok for a while and then he starts thinking about his mother and grandmother then he gets depressed again   He is appropriate in his interactions with others and attends most groups A   Verbal support given   Medications administered and effectiveness monitored   Q 15 min checks    Discussed issues of grief with patient and offered encouragement  R   Pt safe at present

## 2015-01-22 NOTE — [facility] Group Notes (Signed)
[facility] LCSW Group Therapy 01/22/2015  1:15 PM Type of Therapy: Group Therapy Participation Level: Active  Participation Quality: Attentive, Sharing and Supportive  Affect: Appropriate  Cognitive: Alert and Oriented  Insight: Developing/Improving and Engaged  Engagement in Therapy: Developing/Improving and Engaged  Modes of Intervention: Clarification, Confrontation, Discussion, Education, Exploration, Limit-setting, Orientation, Problem-solving, Rapport Building, Dance movement psychotherapisteality Testing, Socialization and Support  Summary of Progress/Problems: The topic for group today was emotional regulation. This group focused on both positive and negative emotion identification and allowed group members to process ways to identify feelings, regulate negative emotions, and find healthy ways to manage internal/external emotions. Group members were asked to reflect on a time when their reaction to an emotion led to a negative outcome and explored how alternative responses using emotion regulation would have benefited them. Group members were also asked to discuss a time when emotion regulation was utilized when a negative emotion was experienced. Group was facilitated by Perrin MalteseUNCG Phd student intern who explained the emotional response system, triggers, and coping skills. Patient expressed experiencing trouble controlling his emotions, especially anger. He identified one of his triggers as other people talking to him in a condescending or sarcastic tone of voice. Patient shared that going to the beach is relaxing for him.   Samuella BruinKristin Audiel Scheiber, MSW, [employer]CSWA Clinical Social Worker [facility] [phone]

## 2015-01-22 NOTE — Progress Notes (Addendum)
Pt appears very depressed this am and stated,"I just gotta go back to bed." He c/o right sided tooth pain a 11/10. He was given 650 mg of tylenol for the pain and offered heat or ice to apply. Pt rates his depression a 8/10 and his anxiety a 10/10 today. Pt stated he did sleep good last pm. Pt is hopeful [facility] will [facility] into [facility] treatment. Pt requested maalox for indigestion and was given this.

## 2015-01-22 NOTE — [facility] Group Notes (Signed)
[facility] LCSW Aftercare Discharge Planning Group Note  01/22/2015  8:45 AM  Participation Quality: Did Not Attend. Patient invited to participate but declined. Patient observed in bed sleeping.  Shermaine Brigham, MSW, LCSWA Clinical Social Worker [facility] [phone]      

## 2015-01-22 NOTE — Clinical Social Work Note (Signed)
[facility] who reports that patient is not eligible for program at this time due to pending assault charge.  Samuella BruinKristin Traci Plemons, MSW, [employer]LCSWA Clinical Social Worker [facility] [phone]

## 2015-01-22 NOTE — Progress Notes (Signed)
Recreation Therapy Notes  Date: 06.01.16 Time: 9:30am Location: [address] Day[address]  Group Topic: Stress Management  Goal Area(s) Addresses:  Patient will verbalize importance of using healthy stress management.  Patient will identify positive emotions associated with healthy stress management.    Intervention: Beach Guided Imagery Script  Activity : Guided Imagery Script. LRT introduced and educated patients on stress management technique of guided imagery.  A script was used to deliver the technique to patients.  Patients were asked to follow script read by LRT to engage in practicing stress management technique.   Education:  Stress Management, Discharge Planning.   Education Outcome: Acknowledges edcuation/In group clarification offered/Needs additional education  Clinical Observations/Feedback: Patient did not attend group.  Monterrio Gerst, LRT/CTRS         Keyvin Rison A 01/22/2015 3:23 PM 

## 2015-01-22 NOTE — Progress Notes (Signed)
[facility] Group Notes:  (Nursing/MHT/Case Management/Adjunct)  Date:  01/22/2015  Time:  2100  Type of Therapy:  wrap up group  Participation Level:  Active  Participation Quality:  Appropriate, Drowsy, Sharing and Supportive  Affect:  Appropriate  Cognitive:  Appropriate  Insight:  Lacking  Engagement in Group:  Engaged  Modes of Intervention:  Clarification, Education and Support  Summary of Progress/Problems:  Pt shares that he is going to stay with his cousin in [place] until [facility]. Pt is angry when talking about his public defender and the court system due to the fact he feels that his charges should be dismissed but they want to get a continuance. Pt focused on staying out of jail and hoping his time in rehab can substitute for jail time. Pt is looking into [facility] but [facility]  Pt reports wanting to work on letting go and having less fear of the unknown.   Shelah LewandowskySquires, Tarren Sabree Carol 01/22/2015, 9:43 PM

## 2015-01-23 MED ORDER — CLONIDINE HCL ER 0.1 MG PO TB12: 0.1000 mg | ORAL_TABLET | Freq: Every day | ORAL | Status: DC

## 2015-01-23 MED ORDER — FLUOXETINE HCL 20 MG PO CAPS: 20.0000 mg | ORAL_CAPSULE | Freq: Every day | ORAL | Status: DC

## 2015-01-23 MED ORDER — NICOTINE 21 MG/24HR TD PT24: 21.0000 mg | MEDICATED_PATCH | Freq: Every day | TRANSDERMAL | Status: DC

## 2015-01-23 MED ORDER — GABAPENTIN 100 MG PO CAPS: 100.0000 mg | ORAL_CAPSULE | Freq: Three times a day (TID) | ORAL | Status: DC

## 2015-01-23 MED ORDER — AMANTADINE HCL 100 MG PO CAPS: 100.0000 mg | ORAL_CAPSULE | Freq: Two times a day (BID) | ORAL | Status: DC

## 2015-01-23 NOTE — Progress Notes (Signed)
[facility] MD Progress Note  01/23/2015 5:48 PM Derrick [name]  MRN:  [id number] Subjective:  Derrick [name] is anticipating being D/C in the morning. He states he does not want to take the Symmetrel as states he wants to handle the cravings on his own. He feels the other medications are working. He states he is committed to this plan. He will be staying with his cousin during the weekend. States she does not use. He has an appointment to be admitted to [facility] Monday AM Principal Problem: Major depressive disorder, recurrent, severe without psychotic behavior Diagnosis:   Patient Active Problem List   Diagnosis Date Noted  . Major depressive disorder, recurrent, severe without psychotic behavior [F33.2] 01/17/2015    Priority: High  . Polysubstance abuse [F19.10] 05/02/2013    Priority: High  . Suicidal ideation [R45.851] 05/02/2013    Priority: High  . ADHD (attention deficit hyperactivity disorder), combined type [F90.2] 01/18/2015    Priority: Medium  . MDD (major depressive disorder) [F32.2] 05/02/2013  . Suicide attempt [T14.91] 05/02/2013  . Impulse control disorder, unspecified [F63.9] 01/04/2013  . Unspecified episodic mood disorder [F39] 01/04/2013   Total Time spent with patient: 30 minutes   Past Medical History:  Past Medical History  Diagnosis Date  . Bipolar 1 disorder   . Substance abuse   . Anxiety   . Hypertension   . Headache(784.0)    History reviewed. No pertinent past surgical history. Family History: History reviewed. No pertinent family history. Social History:  History  Alcohol Use  . 42.0 oz/week  . 70 Cans of beer per week    Comment: everyday drinker      History  Drug Use  . Yes  . Special: Marijuana, "Crack" cocaine, Cocaine    Comment: heroin    History   Social History  . Marital Status: Single    Spouse Name: N/A  . Number of Children: N/A  . Years of Education: N/A   Social History Main Topics  . Smoking status: Current Every Day Smoker --  2.00 packs/day    Types: Cigarettes  . Smokeless tobacco: Not on file  . Alcohol Use: 42.0 oz/week    70 Cans of beer per week     Comment: everyday drinker   . Drug Use: Yes    Special: Marijuana, "Crack" cocaine, Cocaine     Comment: heroin  . Sexual Activity: Yes   Other Topics Concern  . None   Social History Narrative   Additional History:    Sleep: Fair  Appetite:  Fair   Assessment:   Musculoskeletal: Strength & Muscle Tone: within normal limits Gait & Station: normal Patient leans: N/A   Psychiatric Specialty Exam: Physical Exam  Review of Systems  Constitutional: Negative.   HENT: Negative.   Eyes: Negative.   Respiratory: Negative.   Cardiovascular: Negative.   Gastrointestinal: Negative.   Genitourinary: Negative.   Musculoskeletal: Negative.   Skin: Negative.   Neurological: Negative.   Endo/Heme/Allergies: Negative.   Psychiatric/Behavioral: Positive for depression and substance abuse. The patient is nervous/anxious.     Blood pressure 101/64, pulse 67, temperature 97.8 F (36.6 C), temperature source Oral, resp. rate 16, height 5' 9.5" (1.765 m), weight 80.74 kg (178 lb), SpO2 100 %.Body mass index is 25.92 kg/(m^2).  General Appearance: Fairly Groomed  Patent attorney::  Fair  Speech:  Clear and Coherent  Volume:  fluctuates  Mood:  Anxious and worried  Affect:  anxious worried  Thought Process:  Coherent and  Goal Directed  Orientation:  Full (Time, Place, and Person)  Thought Content:  symptoms events worries concerns  Suicidal Thoughts:  No  Homicidal Thoughts:  No  Memory:  Immediate;   Fair Recent;   Fair Remote;   Fair  Judgement:  Fair  Insight:  Present  Psychomotor Activity:  Restlessness  Concentration:  Fair  Recall:  [employer] of Knowledge:Fair  Language: Fair  Akathisia:  No  Handed:  Right  AIMS (if indicated):     Assets:  Desire for Improvement  ADL's:  Intact  Cognition: WNL  Sleep:  Number of Hours: 6.25      Current Medications: Current Facility-Administered Medications  Medication Dose Route Frequency Provider Last Rate Last Dose  . acetaminophen (TYLENOL) tablet 650 mg  650 mg Oral Q6H PRN Worthy Flank, NP   650 mg at 01/22/15 0752  . alum & mag hydroxide-simeth (MAALOX/MYLANTA) 200-200-20 MG/5ML suspension 30 mL  30 mL Oral Q4H PRN Worthy Flank, NP   30 mL at 01/22/15 0753  . amantadine (SYMMETREL) capsule 100 mg  100 mg Oral BID Rachael Fee, MD   100 mg at 01/22/15 1646  . cloNIDine HCl (KAPVAY) ER tablet 0.1 mg  0.1 mg Oral QHS Rachael Fee, MD   0.1 mg at 01/22/15 2059  . FLUoxetine (PROZAC) capsule 20 mg  20 mg Oral Daily Rachael Fee, MD   20 mg at 01/23/15 0809  . gabapentin (NEURONTIN) capsule 100 mg  100 mg Oral TID Rachael Fee, MD   100 mg at 01/23/15 1728  . hydrOXYzine (ATARAX/VISTARIL) tablet 25 mg  25 mg Oral TID PRN Rachael Fee, MD      . ibuprofen (ADVIL,MOTRIN) tablet 800 mg  800 mg Oral Q8H PRN Rachael Fee, MD   800 mg at 01/23/15 1728  . magnesium hydroxide (MILK OF MAGNESIA) suspension 30 mL  30 mL Oral Daily PRN Worthy Flank, NP   30 mL at 01/23/15 0813  . multivitamin with minerals tablet 1 tablet  1 tablet Oral Daily Rachael Fee, MD   1 tablet at 01/23/15 0809  . nicotine (NICODERM CQ - dosed in mg/24 hours) patch 21 mg  21 mg Transdermal Daily Rachael Fee, MD   21 mg at 01/23/15 [phone]  . ondansetron (ZOFRAN) tablet 4 mg  4 mg Oral Q8H PRN Worthy Flank, NP   4 mg at 01/20/15 0846  . pneumococcal 23 valent vaccine (PNU-IMMUNE) injection 0.5 mL  0.5 mL Intramuscular Tomorrow-1000 Rachael Fee, MD   0.5 mL at 01/18/15 0959  . thiamine (VITAMIN B-1) tablet 100 mg  100 mg Oral Daily Rachael Fee, MD   100 mg at 01/23/15 0809  . zolpidem (AMBIEN) tablet 10 mg  10 mg Oral QHS PRN Rachael Fee, MD   10 mg at 01/22/15 2059    Lab Results: No results found for this or any previous visit (from the past 48 hour(s)).  Physical Findings: AIMS:  , ,  ,   ,    CIWA:  CIWA-Ar Total: 4 COWS:     Treatment Plan Summary: Daily contact with patient to assess and evaluate symptoms and progress in treatment and Medication management Supportive approach/coping skills Polysubstance abuse-dependence; will continue to work a relapse prevention plan Depression; will continue the Prozac 20 mg daily ADHD; will continue the Kapvay at 0.1 mg daily CBT/mindfulness   Medical Decision Making:  Review of Psycho-Social Stressors (  1) and Review of Medication Regimen & Side Effects (2)     Samarie Pinder A 01/23/2015, 5:48 PM

## 2015-01-23 NOTE — [facility] Group Notes (Signed)
[facility] LCSW Group Therapy 01/23/2015 1:15 PM Type of Therapy: Group Therapy Participation Level: Active  Participation Quality: Attentive, Sharing and Supportive  Affect: Appropriate  Cognitive: Alert and Oriented  Insight: Developing/Improving and Engaged  Engagement in Therapy: Developing/Improving and Engaged  Modes of Intervention: Activity, Clarification, Confrontation, Discussion, Education, Exploration, Limit-setting, Orientation, Problem-solving, Rapport Building, Reality Testing, Socialization and Support  Summary of Progress/Problems: Patient was attentive and engaged with speaker from [employer]. Patient was attentive to speaker while they shared their story of dealing with mental health and overcoming it. Patient expressed interest in their programs and services and received information on their agency. Patient processed ways they can relate to the speaker.   Cissy Galbreath, MSW, LCSWA Clinical Social Worker [facility] [phone]   

## 2015-01-23 NOTE — Progress Notes (Signed)
Patient ID: Emerson Monteimothy J Heist, male   DOB: September 07, 1985, 29 y.o.   MRN: [id number] D: Client visible on the unit, interacts appropriately with staff and peers. Client reports depression as "2" of 10. "[facility], but will be staying with my cousin the weekend, its a safe place, she don't drink or use drugs. She's a Ephriam KnucklesChristian" "I'm transitioning to being a [place], nobodys'perfect" "medication working well" A: Clinical research associateWriter introduced self to client, provided emotional support, encouraged client to move forward in recovery, commended him for seeking as safe place until he [facility]. Staff will monitor q6315min for safety. R: Client is safe on the unit, attended group.

## 2015-01-23 NOTE — Clinical Social Work Note (Signed)
CSW spoke with Funmi at [facility] who reports no bed availability until possibly Monday 01/27/15.  Samuella BruinKristin Kalif Kattner, MSW, [employer]CSWA Clinical Social Worker [facility] [phone]

## 2015-01-23 NOTE — [facility] Group Notes (Signed)
Adult Psychoeducational Group Note  Date:  01/23/2015 Time:  11:00 PM  Group Topic/Focus:  Wrap-Up Group:   The focus of this group is to help patients review their daily goal of treatment and discuss progress on daily workbooks.  Participation Level:  Active  Participation Quality:  Appropriate  Affect:  Appropriate  Cognitive:  Appropriate  Insight: Good  Engagement in Group:  Engaged  Modes of Intervention:  Discussion  Additional: Pt participated and was engage. No SI/HI  Berlin HunWatlington, Keil Pickering A 01/23/2015, 11:00 PM

## 2015-01-23 NOTE — Progress Notes (Signed)
D: Pt presents anxious on approach. Pt rates depression 5/10. Hopeless hopeless 3/10. Pt denies suicidal/homical thoughts. Pt denies withdrawal symptoms. Pt will f/u with [facility] for long-term treatment after d/c. Pt hygiene appropriate for situation. Pt c/o ongoing toothahce and nausea. Pt c/o symmetrel making him feel worse. A: Medications administered as ordered per MD. Verbal support given. Pt encouraged to attend groups. 15 minute checks performed for safety.  R: Pt stated goal is to work on discharge. Pt compliant with treatment.

## 2015-01-23 NOTE — [facility] Suicide Risk Assessment (Signed)
[facility] Discharge Suicide Risk Assessment   Demographic Factors:  Male and Caucasian  Total Time spent with patient: 30 minutes  Musculoskeletal: Strength & Muscle Tone: within normal limits Gait & Station: normal Patient leans: normal  Psychiatric Specialty Exam: Physical Exam  Review of Systems  Constitutional: Negative.   HENT: Negative.   Eyes: Negative.   Respiratory: Negative.   Cardiovascular: Negative.   Gastrointestinal: Negative.   Genitourinary: Negative.   Musculoskeletal: Negative.   Skin: Negative.   Neurological: Negative.   Endo/Heme/Allergies: Negative.   Psychiatric/Behavioral: Positive for substance abuse. The patient is nervous/anxious.     Blood pressure 101/64, pulse 67, temperature 97.8 F (36.6 C), temperature source Oral, resp. rate 16, height 5' 9.5" (1.765 m), weight 80.74 kg (178 lb), SpO2 100 %.Body mass index is 25.92 kg/(m^2).  General Appearance: Fairly Groomed  Patent attorney::  Fair  Speech:  Clear and Coherent409  Volume:  fluctuates  Mood:  Euthymic  Affect:  Appropriate  Thought Process:  Coherent and Goal Directed  Orientation:  Full (Time, Place, and Person)  Thought Content:  plans as he moves on, relapse prevention plan  Suicidal Thoughts:  No  Homicidal Thoughts:  No  Memory:  Immediate;   Fair Recent;   Fair Remote;   Fair  Judgement:  Fair  Insight:  Present  Psychomotor Activity:  Restlessness  Concentration:  Fair  Recall:  [employer] of Knowledge:Fair  Language: Fair  Akathisia:  No  Handed:  Right  AIMS (if indicated):     Assets:  Desire for Improvement  Sleep:  Number of Hours: 6.25  Cognition: WNL  ADL's:  Intact   Have you used any form of tobacco in the last 30 days? (Cigarettes, Smokeless Tobacco, Cigars, and/or Pipes): Yes  Has this patient used any form of tobacco in the last 30 days? (Cigarettes, Smokeless Tobacco, Cigars, and/or Pipes) Yes, Prescription not provided because: nicotine patches were  given  Mental Status Per Nursing Assessment::   On Admission:  Suicidal ideation indicated by patient  Current Mental Status by Physician: IN full contact with reality. There are no active S/S of withdrawal. There are no active SI plans or intent. He is willing and motivated to pursue treatment at [facility] on Monday. He plans to then try to go to a longer term program afterwards. Thinking about [facility] that has a 6 months program   Loss Factors: NA  Historical Factors: Impulsivity  Risk Reduction Factors:   Sense of responsibility to family and Positive social support  Continued Clinical Symptoms:  Depression:   Comorbid alcohol abuse/dependence Alcohol/Substance Abuse/Dependencies  Cognitive Features That Contribute To Risk:  None    Suicide Risk:  Minimal: No identifiable suicidal ideation.  Patients presenting with no risk factors but with morbid ruminations; may be classified as minimal risk based on the severity of the depressive symptoms  Principal Problem: Major depressive disorder, recurrent, severe without psychotic behavior Discharge Diagnoses:  Patient Active Problem List   Diagnosis Date Noted  . Major depressive disorder, recurrent, severe without psychotic behavior [F33.2] 01/17/2015    Priority: High  . Polysubstance abuse [F19.10] 05/02/2013    Priority: High  . Suicidal ideation [R45.851] 05/02/2013    Priority: High  . ADHD (attention deficit hyperactivity disorder), combined type [F90.2] 01/18/2015    Priority: Medium  . MDD (major depressive disorder) [F32.2] 05/02/2013  . Suicide attempt [T14.91] 05/02/2013  . Impulse control disorder, unspecified [F63.9] 01/04/2013  . Unspecified episodic mood disorder [F39] 01/04/2013  Follow-up Information    [facility].   Why:  Admissions screeing on Monday June 6th at 8 am. Please [place] a [facility] [place] and call office if you need to reschedule screening.   Contact  information:   [address],  [place], [place] [address] [phone]      [facility] with  [facility]   Why:  If interested in outpatient services, please go to walk-in clinic on Tuesdays between 9 am to 12 pm to get set up with therapy and medication management services.   Contact information:   [address] [place]  [phone]      Plan Of Care/Follow-up recommendations:  Activity:  as tolerated Diet:  regular Follow up [facility] as above Is patient on multiple antipsychotic therapies at discharge:  No   Has Patient had three or more failed trials of antipsychotic monotherapy by history:  No  Recommended Plan for Multiple Antipsychotic Therapies: NA    Dimitris Shanahan A 01/23/2015, 5:57 PM

## 2015-01-23 NOTE — Progress Notes (Addendum)
  [facility] Discharge Plan :  Will you be returning to the same living situation after discharge:  No. Patient plans to stay with a friend at discharge that he identifies as supportive. At discharge, do you have transportation home?: Yes,  patient reports access to transportation Do you have the ability to pay for your medications: Yes,  patient will be provided with medication samples and prescriptions at discharge  Release of information consent forms completed and in the chart;  Patient's signature needed at discharge.  Patient to Follow up at: Follow-up Information    [facility] 01/[facility].   Why:  Admissions screeing on Monday June 6th at 8 am. Please [place] a [facility] [place] and call office if you need to reschedule screening.   Contact information:   [address],  [place], [place] [address] [phone]      Follow [facility] with  [facility]   Why:  If interested in outpatient services, please go to walk-in clinic on Tuesdays between 9 am to 12 pm to get set up with therapy and medication management services.   Contact information:   [address] [place]  [phone]      Patient denies SI/HI: Yes,  denies    Safety Planning and Suicide Prevention discussed: Yes,  with patient  Have you used any form of tobacco in the last 30 days? (Cigarettes, Smokeless Tobacco, Cigars, and/or Pipes): Yes  Has patient been referred to the Quitline?: Yes, referral faxed 01/23/15.  Jeanni Allshouse, [place] CarboKristin L 01/23/2015, 9:34 AM

## 2015-01-23 NOTE — Discharge Summary (Signed)
Physician Discharge Summary Note  Patient:  Derrick [name] is an 29 y.o., male MRN:  [id number] DOB:  02/16/1986 Patient phone:  [phone] (home)  Patient address:   [address] [place] [address],  Total Time spent with patient: 45 minutes  Date of Admission:  01/17/2015 Date of Discharge: 01/24/2015  Reason for Admission:  Depression  Principal Problem: Major depressive disorder, recurrent, severe without psychotic behavior Discharge Diagnoses: Patient Active Problem List   Diagnosis Date Noted  . ADHD (attention deficit hyperactivity disorder), combined type [F90.2] 01/18/2015  . Major depressive disorder, recurrent, severe without psychotic behavior [F33.2] 01/17/2015  . Polysubstance abuse [F19.10] 05/02/2013  . MDD (major depressive disorder) [F32.2] 05/02/2013  . Suicide attempt [T14.91] 05/02/2013  . Suicidal ideation [R45.851] 05/02/2013  . Impulse control disorder, unspecified [F63.9] 01/04/2013  . Unspecified episodic mood disorder [F39] 01/04/2013    Musculoskeletal: Strength & Muscle Tone: within normal limits Gait & Station: normal Patient leans: N/A  Psychiatric Specialty Exam:  SEE SRA Physical Exam  Vitals reviewed. Genitourinary: Prostate normal.    Review of Systems  Constitutional: Negative for fever.  Cardiovascular: Negative for chest pain.  Psychiatric/Behavioral: Negative for depression.  All other systems reviewed and are negative.   Blood pressure 101/64, pulse 67, temperature 97.8 F (36.6 C), temperature source Oral, resp. rate 16, height 5' 9.5" (1.765 m), weight 80.74 kg (178 lb), SpO2 100 %.Body mass index is 25.92 kg/(m^2).  Have you used any form of tobacco in the last 30 days? (Cigarettes, Smokeless Tobacco, Cigars, and/or Pipes): Yes  Has this patient used any form of tobacco in the last 30 days? (Cigarettes, Smokeless Tobacco, Cigars, and/or Pipes) Yes, Prescription not provided because: samples and Rx actually  given  Past Medical History:  Past Medical History  Diagnosis Date  . Bipolar 1 disorder   . Substance abuse   . Anxiety   . Hypertension   . Headache(784.0)    History reviewed. No pertinent past surgical history. Family History: History reviewed. No pertinent family history. Social History:  History  Alcohol Use  . 42.0 oz/week  . 70 Cans of beer per week    Comment: everyday drinker      History  Drug Use  . Yes  . Special: Marijuana, "Crack" cocaine, Cocaine    Comment: heroin    History   Social History  . Marital Status: Single    Spouse Name: N/A  . Number of Children: N/A  . Years of Education: N/A   Social History Main Topics  . Smoking status: Current Every Day Smoker -- 2.00 packs/day    Types: Cigarettes  . Smokeless tobacco: Not on file  . Alcohol Use: 42.0 oz/week    70 Cans of beer per week     Comment: everyday drinker   . Drug Use: Yes    Special: Marijuana, "Crack" cocaine, Cocaine     Comment: heroin  . Sexual Activity: Yes   Other Topics Concern  . None   Social History Narrative   Risk to Self: Is patient at risk for suicide?: Yes Risk to Others:   Prior Inpatient Therapy:   Prior Outpatient Therapy:    Level of Care:  OP  Hospital Course:  29 Y/o male who states that his mother died 2 weeks ago. States he was mostly using drugs here and there. He had been taking care of his mother for the last couple of years as she was sick with lung cancer. States  after she died everything got worst between him and his father and his brother. States he is drinking 3-4 40 ounces recently started smoking heroin and cocaine, and smarijuana. Has sniffed some glue and other inhelants.  Derrick [name] was admitted for Major depressive disorder, recurrent, severe without psychotic behavior and crisis management.  He was treated discharged with the medications listed below under Medication List.  Medical problems were identified and treated as needed.   Home medications were restarted as appropriate.  Improvement was monitored by observation and Derrick [name] daily report of symptom reduction.  Emotional and mental status was monitored by daily self-inventory reports completed by Derrick [name] and clinical staff.         Derrick [name] was evaluated by the treatment team for stability and plans for continued recovery upon discharge.  Derrick [name] motivation was an integral factor for scheduling further treatment.  Employment, transportation, bed availability, health status, family support, and any pending legal issues were also considered during his hospital stay.  He was offered further treatment options upon discharge including but not limited to Residential, Intensive Outpatient, and Outpatient treatment.  Derrick [name] will follow up with the services as listed below under Follow Up Information.     Upon completion of this admission the patient was both mentally and medically stable for discharge denying suicidal/homicidal ideation, auditory/visual/tactile hallucinations, delusional thoughts and paranoia.      Consults:  psychiatry  Significant Diagnostic Studies:  labs: cocaine pos.  THC pos.  Discharge Vitals:   Blood pressure 101/64, pulse 67, temperature 97.8 F (36.6 C), temperature source Oral, resp. rate 16, height 5' 9.5" (1.765 m), weight 80.74 kg (178 lb), SpO2 100 %. Body mass index is 25.92 kg/(m^2). Lab Results:   No results found for this or any previous visit (from the past 72 hour(s)).  Physical Findings: AIMS:  , ,  ,  ,    CIWA:  CIWA-Ar Total: 4 COWS:      See Psychiatric Specialty Exam and Suicide Risk Assessment completed by Attending Physician prior to discharge.  Discharge destination:  Home  Is patient on multiple antipsychotic therapies at discharge:  No   Has Patient had three or more failed trials of antipsychotic monotherapy by history:  No  Recommended Plan for Multiple Antipsychotic  Therapies: NA     Medication List    STOP taking these medications        amitriptyline 75 MG tablet  Commonly known as:  ELAVIL     aspirin 81 MG chewable tablet     atomoxetine 25 MG capsule  Commonly known as:  STRATTERA     hydrOXYzine 25 MG tablet  Commonly known as:  ATARAX/VISTARIL     ziprasidone 20 MG capsule  Commonly known as:  GEODON      TAKE these medications      Indication   amantadine 100 MG capsule  Commonly known as:  SYMMETREL  Take 1 capsule (100 mg total) by mouth 2 (two) times daily.   Indication:  Extrapyramidal Reaction caused by Medications     cloNIDine HCl 0.1 MG Tb12 ER tablet  Commonly known as:  KAPVAY  Take 1 tablet (0.1 mg total) by mouth at bedtime.   Indication:  Attention Deficit Hyperactivity Disorder     FLUoxetine 20 MG capsule  Commonly known as:  PROZAC  Take 1 capsule (20 mg total) by mouth daily.   Indication:  Major Depressive Disorder  gabapentin 100 MG capsule  Commonly known as:  NEURONTIN  Take 1 capsule (100 mg total) by mouth 3 (three) times daily.   Indication:  Agitation, Neuropathic Pain     nicotine 21 mg/24hr patch  Commonly known as:  NICODERM CQ - dosed in mg/24 hours  Place 1 patch (21 mg total) onto the skin daily.   Indication:  Nicotine Addiction           Follow-up Information    Follow up with [facility] On 01/27/2015.   Why:  Admissions screeing on Monday June 6th at 8 am. Please bring a [facility] ID and call office if you need to reschedule screening.   Contact information:   [address] [place], [place] [address] [phone]      [facility] with  [facility]   Why:  If interested in outpatient services, please go to walk-in clinic on Tuesdays between 9 am to 12 pm to get set up with therapy and medication management services.   Contact information:   [address] [place]  [phone]      Follow-up  recommendations:  Activity:  as tol, diet as tol  Comments:  1.  Take all your medications as prescribed.              2.  Report any adverse side effects to outpatient provider.                       3.  Patient instructed to not use alcohol or illegal drugs while on prescription medicines.            4.  In the event of worsening symptoms, instructed patient to call 911, the crisis hotline or go to nearest emergency room for evaluation of symptoms.  Total Discharge Time:  40 min  Signed: Velna Hatchet May Agustin AGNP-BC 01/23/2015, 5:28 PM  I personally assessed the patient and formulated the plan Madie Reno A. Dub Mikes, M.D.

## 2015-01-24 NOTE — Progress Notes (Signed)
Discharge Note:  Patient discharged.  Patient denied SI and HI.  Denied A/V hallucinations.  Denied pain.  Suicide prevention information given and discussed with patient who stated he understood and had no questions.  Patient stated he received all his belongings, clothing, toiletries, misc items, prescriptions, medications, shoes, phone.  Patient stated he appreciated all assistance received from [facility].

## 2015-01-24 NOTE — Progress Notes (Signed)
Patient looking forward to discharge this morning.  Denied A/V hallucinations.  Denied SI and HI.  Denied pain.

## 2015-03-10 ENCOUNTER — Emergency Department (HOSPITAL_BASED_OUTPATIENT_CLINIC_OR_DEPARTMENT_OTHER): Payer: Self-pay

## 2015-03-10 ENCOUNTER — Emergency Department (HOSPITAL_BASED_OUTPATIENT_CLINIC_OR_DEPARTMENT_OTHER)
Admission: EM | Admit: 2015-03-10 | Discharge: 2015-03-10 | Disposition: A | Discharge status: Home / Self Care | Payer: Self-pay | Attending: Emergency Medicine | Admitting: Emergency Medicine

## 2015-03-10 ENCOUNTER — Encounter (HOSPITAL_BASED_OUTPATIENT_CLINIC_OR_DEPARTMENT_OTHER): Payer: Self-pay | Admitting: *Deleted

## 2015-03-10 DIAGNOSIS — Z79899 Other long term (current) drug therapy: Secondary | ICD-10-CM | POA: Insufficient documentation

## 2015-03-10 DIAGNOSIS — K088 Other specified disorders of teeth and supporting structures: Secondary | ICD-10-CM | POA: Insufficient documentation

## 2015-03-10 DIAGNOSIS — I10 Essential (primary) hypertension: Secondary | ICD-10-CM | POA: Insufficient documentation

## 2015-03-10 DIAGNOSIS — R519 Headache, unspecified: Secondary | ICD-10-CM

## 2015-03-10 DIAGNOSIS — Z720 Tobacco use: Secondary | ICD-10-CM | POA: Insufficient documentation

## 2015-03-10 DIAGNOSIS — F419 Anxiety disorder, unspecified: Secondary | ICD-10-CM | POA: Insufficient documentation

## 2015-03-10 DIAGNOSIS — F0781 Postconcussional syndrome: Secondary | ICD-10-CM | POA: Insufficient documentation

## 2015-03-10 DIAGNOSIS — F319 Bipolar disorder, unspecified: Secondary | ICD-10-CM | POA: Insufficient documentation

## 2015-03-10 DIAGNOSIS — R51 Headache: Secondary | ICD-10-CM | POA: Insufficient documentation

## 2015-03-10 DIAGNOSIS — K0889 Other specified disorders of teeth and supporting structures: Secondary | ICD-10-CM

## 2015-03-10 MED ORDER — PENICILLIN V POTASSIUM 500 MG PO TABS: 500.0000 mg | ORAL_TABLET | Freq: Four times a day (QID) | ORAL | Status: AC

## 2015-03-10 MED ORDER — SODIUM CHLORIDE 0.9 % IV BOLUS (SEPSIS)
1000.0000 mL | INTRAVENOUS | Status: AC
  Administered 2015-03-10: 1000 mL via INTRAVENOUS

## 2015-03-10 MED ORDER — METOCLOPRAMIDE HCL 5 MG/ML IJ SOLN
10.0000 mg | Freq: Once | INTRAMUSCULAR | Status: AC
  Administered 2015-03-10: 10 mg via INTRAVENOUS
  Filled 2015-03-10: qty 2

## 2015-03-10 MED ORDER — DIPHENHYDRAMINE HCL 50 MG/ML IJ SOLN
25.0000 mg | Freq: Once | INTRAMUSCULAR | Status: AC
  Administered 2015-03-10: 25 mg via INTRAVENOUS
  Filled 2015-03-10: qty 1

## 2015-03-10 MED ORDER — DEXAMETHASONE SODIUM PHOSPHATE 10 MG/ML IJ SOLN
10.0000 mg | Freq: Once | INTRAMUSCULAR | Status: AC
  Administered 2015-03-10: 10 mg via INTRAVENOUS
  Filled 2015-03-10: qty 1
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
## 2015-04-15 ENCOUNTER — Emergency Department (HOSPITAL_BASED_OUTPATIENT_CLINIC_OR_DEPARTMENT_OTHER)
Admission: EM | Admit: 2015-04-15 | Discharge: 2015-04-15 | Disposition: A | Discharge status: Home / Self Care | Payer: Self-pay | Attending: Emergency Medicine | Admitting: Emergency Medicine

## 2015-04-15 ENCOUNTER — Encounter (HOSPITAL_BASED_OUTPATIENT_CLINIC_OR_DEPARTMENT_OTHER): Payer: Self-pay

## 2015-04-15 DIAGNOSIS — J029 Acute pharyngitis, unspecified: Secondary | ICD-10-CM | POA: Insufficient documentation

## 2015-04-15 DIAGNOSIS — L0291 Cutaneous abscess, unspecified: Secondary | ICD-10-CM

## 2015-04-15 DIAGNOSIS — F319 Bipolar disorder, unspecified: Secondary | ICD-10-CM | POA: Insufficient documentation

## 2015-04-15 DIAGNOSIS — Z880 Allergy status to penicillin: Secondary | ICD-10-CM | POA: Insufficient documentation

## 2015-04-15 DIAGNOSIS — Z79899 Other long term (current) drug therapy: Secondary | ICD-10-CM | POA: Insufficient documentation

## 2015-04-15 DIAGNOSIS — F419 Anxiety disorder, unspecified: Secondary | ICD-10-CM | POA: Insufficient documentation

## 2015-04-15 DIAGNOSIS — L0390 Cellulitis, unspecified: Secondary | ICD-10-CM

## 2015-04-15 DIAGNOSIS — I10 Essential (primary) hypertension: Secondary | ICD-10-CM | POA: Insufficient documentation

## 2015-04-15 DIAGNOSIS — L02415 Cutaneous abscess of right lower limb: Secondary | ICD-10-CM | POA: Insufficient documentation

## 2015-04-15 DIAGNOSIS — L03115 Cellulitis of right lower limb: Secondary | ICD-10-CM | POA: Insufficient documentation

## 2015-04-15 DIAGNOSIS — Z720 Tobacco use: Secondary | ICD-10-CM | POA: Insufficient documentation

## 2015-04-15 DIAGNOSIS — R5383 Other fatigue: Secondary | ICD-10-CM | POA: Insufficient documentation

## 2015-04-15 LAB — RAPID STREP SCREEN (MED CTR MEBANE ONLY): STREPTOCOCCUS, GROUP A SCREEN (DIRECT): NEGATIVE

## 2015-04-15 MED ORDER — LIDOCAINE-EPINEPHRINE (PF) 2 %-1:200000 IJ SOLN
20.0000 mL | Freq: Once | INTRAMUSCULAR | Status: DC
  Filled 2015-04-15: qty 20

## 2015-04-15 MED ORDER — SULFAMETHOXAZOLE-TRIMETHOPRIM 800-160 MG PO TABS: 1.0000 | ORAL_TABLET | Freq: Two times a day (BID) | ORAL | Status: AC

## 2015-04-15 MED ORDER — ACETAMINOPHEN 325 MG PO TABS
650.0000 mg | ORAL_TABLET | Freq: Once | ORAL | Status: AC
  Administered 2015-04-15: 650 mg via ORAL
  Filled 2015-04-15: qty 2

## 2015-04-15 NOTE — ED Notes (Signed)
Red area to right LE x 2 days-at [facility] x 77 days for crack use

## 2015-04-15 NOTE — ED Provider Notes (Signed)
CSN: [id number]     Arrival date & time 04/15/15  1315 History   First MD Initiated Contact with Patient 04/15/15 1333     Chief Complaint  Patient presents with  . Leg Problem     (Consider location/radiation/quality/duration/timing/severity/associated sxs/prior Treatment) HPI Comments: Patient presents with complaint of a red swollen area on his right leg that started out as a "bite" 2 days ago and has gradually worsened and become more painful. No drainage from the area. Patient states that he had a mosquito bite just next to this before the swelling occurred. No fever or streaking. No history of similar symptoms. No other treatments prior to arrival.  Patient also complains of 2 days of sore throat, malaise, and generalized fatigue. No evidence of contacts. No other URI symptoms. No difficulty swallowing.  Of note, patient is currently at [facility] due for substance abuse recovery.  The history is provided by the patient.    Past Medical History  Diagnosis Date  . Bipolar 1 disorder   . Substance abuse   . Anxiety   . Hypertension   . Headache(784.0)    History reviewed. No pertinent past surgical history. No family history on file. Social History  Substance Use Topics  . Smoking status: Current Every Day Smoker -- 2.00 packs/day    Types: Cigarettes  . Smokeless tobacco: None  . Alcohol Use: 42.0 oz/week    70 Cans of beer per week     Comment: in [facility]    Review of Systems  Constitutional: Positive for fatigue. Negative for fever.  HENT: Positive for sore throat.   Gastrointestinal: Positive for nausea. Negative for vomiting.  Skin: Negative for color change.       Positive for abscess  Hematological: Negative for adenopathy.      Allergies  Penicillins  Home Medications   Prior to Admission medications   Medication Sig Start Date End Date Taking? Authorizing Provider  OMEPRAZOLE PO Take by mouth.   Yes Historical Provider, MD  Ondansetron HCl (ZOFRAN  PO) Take by mouth.   Yes Historical Provider, MD  cloNIDine HCl (KAPVAY) 0.1 MG TB12 ER tablet Take 1 tablet (0.1 mg total) by mouth at bedtime. 01/23/15   Adonis Brook, NP  FLUoxetine (PROZAC) 20 MG capsule Take 1 capsule (20 mg total) by mouth daily. 01/23/15   Adonis Brook, NP   BP 130/70 mmHg  Pulse 79  Temp(Src) 99.1 F (37.3 C) (Oral)  Resp 16  Ht 6' (1.829 m)  Wt 195 lb (88.451 kg)  BMI 26.44 kg/m2  SpO2 99% Physical Exam  Constitutional: He appears well-developed and well-nourished.  HENT:  Head: Normocephalic and atraumatic.  Right Ear: Tympanic membrane, external ear and ear canal normal.  Left Ear: Tympanic membrane, external ear and ear canal normal.  Nose: No mucosal edema or rhinorrhea.  Mouth/Throat: Uvula is midline and mucous membranes are normal. Posterior oropharyngeal erythema present. No oropharyngeal exudate, posterior oropharyngeal edema or tonsillar abscesses.  Eyes: Conjunctivae are normal. Right eye exhibits no discharge. Left eye exhibits no discharge.  Neck: Normal range of motion. Neck supple.  Cardiovascular: Normal rate, regular rhythm and normal heart sounds.   Pulmonary/Chest: Effort normal and breath sounds normal.  Abdominal: Soft. There is no tenderness.  Neurological: He is alert.  Skin: Skin is warm and dry.     Psychiatric: He has a normal mood and affect.  Nursing note and vitals reviewed.   ED Course  Procedures (including critical care time) Labs Review  Labs Reviewed  RAPID STREP SCREEN (NOT AT [facility])    Imaging Review No results found. I have personally reviewed and evaluated these images and lab results as part of my medical decision-making.   EKG Interpretation None       1:41 PM Patient seen and examined. Work-up initiated. Rapid strep screen sent. Will I&D small abscess on leg.   Vital signs reviewed and are as follows: BP 130/70 mmHg  Pulse 79  Temp(Src) 99.1 F (37.3 C) (Oral)  Resp 16  Ht 6' (1.829 m)  Wt 195  lb (88.451 kg)  BMI 26.44 kg/m2  SpO2 99%  INCISION AND DRAINAGE Performed by: Carolee Rota Consent: Verbal consent obtained. Risks and benefits: risks, benefits and alternatives were discussed Type: abscess  Body area: R leg  Anesthesia: local infiltration  Incision was made with a scalpel.  Local anesthetic: lidocaine 2% with epinephrine  Anesthetic total: 2 ml  Complexity: complex Blunt dissection to break up loculations  Drainage: purulent  Drainage amount: small  Packing material: none  Patient tolerance: Patient tolerated the procedure well with no immediate complications.  Strep test neg.   2:12 PM The patient was urged to return to the Emergency Department urgently with worsening pain, swelling, expanding erythema especially if it streaks away from the affected area, fever, or if they have any other concerns.   The patient was urged to return to the Emergency Department or go to their PCP in 48 hours for wound recheck if the area is not significantly improved.  The patient verbalized understanding and stated agreement with this plan.     MDM   Final diagnoses:  Abscess and cellulitis  Pharyngitis   Abscess: Incision and drainage performed. As there is some surrounding cellulitis, patient placed on Bactrim. No lymphangitis or systemic symptoms of illness requiring lab workup or imaging.  Pharyngitis: Strep test is negative. Conservative management indicated.    Renne Crigler, PA-C 04/15/15 1412  Mirian Mo, MD 04/16/15 [phone]

## 2015-04-15 NOTE — Discharge Instructions (Signed)
Please read and follow all provided instructions.  Your diagnoses today include:  1. Abscess and cellulitis   2. Pharyngitis    Tests performed today include:  Vital signs. See below for your results today.   Strep test - negative  Medications prescribed:   Bactrim (trimethoprim/sulfamethoxazole) - antibiotic  You have been prescribed an antibiotic medicine: take the entire course of medicine even if you are feeling better. Stopping early can cause the antibiotic not to work.  Take any prescribed medications only as directed.   Home care instructions:   Follow any educational materials contained in this packet  Follow-up instructions: Return to the Emergency Department in 48 hours for a recheck if your symptoms are not significantly improved.  Please follow-up with your primary care provider in the next 1 week for further evaluation of your symptoms.   Return instructions:  Return to the Emergency Department if you have:  Fever  Worsening symptoms  Worsening pain  Worsening swelling  Redness of the skin that moves away from the affected area, especially if it streaks away from the affected area   Any other emergent concerns  Your vital signs today were: BP 130/70 mmHg   Pulse 79   Temp(Src) 99.1 F (37.3 C) (Oral)   Resp 16   Ht 6' (1.829 m)   Wt 195 lb (88.451 kg)   BMI 26.44 kg/m2   SpO2 99% If your blood pressure (BP) was elevated above 135/85 this visit, please have this repeated by your doctor within one month. --------------

## 2015-04-19 LAB — CULTURE, GROUP A STREP

## 2015-05-08 ENCOUNTER — Ambulatory Visit: Payer: Self-pay

## 2015-05-15 ENCOUNTER — Ambulatory Visit: Payer: Self-pay

## 2015-05-15 LAB — LIPID PANEL
Cholesterol: 222 mg/dL — AB (ref 0–200)
HDL: 39 mg/dL (ref 35–70)
LDL CALC: 124 mg/dL
TRIGLYCERIDES: 295 mg/dL — AB (ref 40–160)

## 2015-05-15 LAB — TSH: TSH: 4.6 u[IU]/mL (ref 0.41–5.90)

## 2015-05-15 LAB — BASIC METABOLIC PANEL
BUN: 18 mg/dL (ref 4–21)
Creatinine: 0.9 mg/dL (ref 0.6–1.3)
Glucose: 98 mg/dL
SODIUM: 142 mmol/L (ref 137–147)

## 2015-05-15 LAB — CBC AND DIFFERENTIAL
Neutrophils Absolute: 6 /uL
WBC: 9.1 10^3/mL

## 2015-05-15 LAB — HEMOGLOBIN A1C: Hemoglobin A1C: 5.7

## 2015-05-23 ENCOUNTER — Encounter: Payer: Self-pay | Admitting: Emergency Medicine

## 2015-05-23 ENCOUNTER — Emergency Department
Admission: EM | Admit: 2015-05-23 | Discharge: 2015-05-23 | Disposition: A | Discharge status: Home / Self Care | Payer: Self-pay | Attending: Emergency Medicine | Admitting: Emergency Medicine

## 2015-05-23 ENCOUNTER — Emergency Department: Payer: Self-pay

## 2015-05-23 DIAGNOSIS — R109 Unspecified abdominal pain: Secondary | ICD-10-CM

## 2015-05-23 DIAGNOSIS — Z880 Allergy status to penicillin: Secondary | ICD-10-CM | POA: Insufficient documentation

## 2015-05-23 DIAGNOSIS — R1011 Right upper quadrant pain: Secondary | ICD-10-CM | POA: Insufficient documentation

## 2015-05-23 DIAGNOSIS — I10 Essential (primary) hypertension: Secondary | ICD-10-CM | POA: Insufficient documentation

## 2015-05-23 DIAGNOSIS — F419 Anxiety disorder, unspecified: Secondary | ICD-10-CM | POA: Insufficient documentation

## 2015-05-23 DIAGNOSIS — Z720 Tobacco use: Secondary | ICD-10-CM | POA: Insufficient documentation

## 2015-05-23 DIAGNOSIS — Z79899 Other long term (current) drug therapy: Secondary | ICD-10-CM | POA: Insufficient documentation

## 2015-05-23 LAB — CBC WITH DIFFERENTIAL/PLATELET
BASOS PCT: 1 %
Basophils Absolute: 0.1 10*3/uL (ref 0–0.1)
EOS ABS: 0.2 10*3/uL (ref 0–0.7)
Eosinophils Relative: 2 %
HCT: 45.9 % (ref 40.0–52.0)
Hemoglobin: 15.4 g/dL (ref 13.0–18.0)
Lymphocytes Relative: 26 %
Lymphs Abs: 1.9 10*3/uL (ref 1.0–3.6)
MCH: 30.2 pg (ref 26.0–34.0)
MCHC: 33.5 g/dL (ref 32.0–36.0)
MCV: 90 fL (ref 80.0–100.0)
MONO ABS: 0.7 10*3/uL (ref 0.2–1.0)
MONOS PCT: 9 %
Neutro Abs: 4.7 10*3/uL (ref 1.4–6.5)
Neutrophils Relative %: 62 %
Platelets: 253 10*3/uL (ref 150–440)
RBC: 5.1 MIL/uL (ref 4.40–5.90)
RDW: 12.8 % (ref 11.5–14.5)
WBC: 7.5 10*3/uL (ref 3.8–10.6)

## 2015-05-23 LAB — URINE DRUG SCREEN, QUALITATIVE (ARMC ONLY)
Amphetamines, Ur Screen: NOT DETECTED
BENZODIAZEPINE, UR SCRN: NOT DETECTED
Barbiturates, Ur Screen: NOT DETECTED
Cannabinoid 50 Ng, Ur ~~LOC~~: NOT DETECTED
Cocaine Metabolite,Ur ~~LOC~~: NOT DETECTED
MDMA (Ecstasy)Ur Screen: NOT DETECTED
METHADONE SCREEN, URINE: NOT DETECTED
Opiate, Ur Screen: NOT DETECTED
Phencyclidine (PCP) Ur S: NOT DETECTED
TRICYCLIC, UR SCREEN: NOT DETECTED

## 2015-05-23 LAB — URINALYSIS COMPLETE WITH MICROSCOPIC (ARMC ONLY)
BACTERIA UA: NONE SEEN
BILIRUBIN URINE: NEGATIVE
Glucose, UA: NEGATIVE mg/dL
Hgb urine dipstick: NEGATIVE
Ketones, ur: NEGATIVE mg/dL
Leukocytes, UA: NEGATIVE
NITRITE: NEGATIVE
Protein, ur: NEGATIVE mg/dL
Specific Gravity, Urine: 1.006 (ref 1.005–1.030)
Squamous Epithelial / LPF: NONE SEEN
pH: 6 (ref 5.0–8.0)

## 2015-05-23 LAB — COMPREHENSIVE METABOLIC PANEL
ALBUMIN: 4.5 g/dL (ref 3.5–5.0)
ALT: 33 U/L (ref 17–63)
AST: 30 U/L (ref 15–41)
Alkaline Phosphatase: 53 U/L (ref 38–126)
Anion gap: 8 (ref 5–15)
BILIRUBIN TOTAL: 0.7 mg/dL (ref 0.3–1.2)
BUN: 12 mg/dL (ref 6–20)
CALCIUM: 9.4 mg/dL (ref 8.9–10.3)
CO2: 28 mmol/L (ref 22–32)
Chloride: 101 mmol/L (ref 101–111)
Creatinine, Ser: 0.86 mg/dL (ref 0.61–1.24)
GFR calc Af Amer: >60 mL/min (ref >60)
GFR calc non Af Amer: >60 mL/min (ref >60)
GLUCOSE: 84 mg/dL (ref 65–99)
Potassium: 4 mmol/L (ref 3.5–5.1)
SODIUM: 137 mmol/L (ref 135–145)
Total Protein: 7.3 g/dL (ref 6.5–8.1)

## 2015-05-23 LAB — ACETAMINOPHEN LEVEL: Acetaminophen (Tylenol), Serum: <10 ug/mL — ABNORMAL LOW (ref 10–30)

## 2015-05-23 LAB — SALICYLATE LEVEL

## 2015-05-23 NOTE — ED Notes (Signed)
Pt performed rectal exam, witnessed by this RN. Pt tolerated well.

## 2015-05-23 NOTE — ED Notes (Signed)
Patient to ER for c/o "extreme anxiety". States he takes Prozac daily, but doesn't feel like it's working right now. States he also is having urinary retention/difficulty. Patient states it normally takes him 5-10 minutes to be able to urinate on a daily basis. But states last couple of days he has had pain at suprapubic region, and has not been able to urinate today.

## 2015-05-23 NOTE — Discharge Instructions (Signed)
Abdominal Pain Many things can cause abdominal pain. Usually, abdominal pain is not caused by a disease and will improve without treatment. It can often be observed and treated at home. Your health care provider will do a physical exam and possibly order blood tests and X-rays to help determine the seriousness of your pain. However, in many cases, more time must pass before a clear cause of the pain can be found. Before that point, your health care provider may not know if you need more testing or further treatment. HOME CARE INSTRUCTIONS  Monitor your abdominal pain for any changes. The following actions may help to alleviate any discomfort you are experiencing:  Only take over-the-counter or prescription medicines as directed by your health care provider.  Do not take laxatives unless directed to do so by your health care provider.  Try a clear liquid diet (broth, tea, or water) as directed by your health care provider. Slowly move to a bland diet as tolerated. SEEK MEDICAL CARE IF:  You have unexplained abdominal pain.  You have abdominal pain associated with nausea or diarrhea.  You have pain when you urinate or have a bowel movement.  You experience abdominal pain that wakes you in the night.  You have abdominal pain that is worsened or improved by eating food.  You have abdominal pain that is worsened with eating fatty foods.  You have a fever. SEEK IMMEDIATE MEDICAL CARE IF:   Your pain does not go away within 2 hours.  You keep throwing up (vomiting).  Your pain is felt only in portions of the abdomen, such as the right side or the left lower portion of the abdomen.  You pass bloody or black tarry stools. MAKE SURE YOU:  Understand these instructions.   Will watch your condition.   Will get help right away if you are not doing well or get worse.  Document Released: 05/19/2005 Document Revised: 08/14/2013 Document Reviewed: 04/18/2013 [facility] Patient Information  2015 [place], [place]. This information is not intended to replace advice given to you by your health care provider. Make sure you discuss any questions you have with your health care provider.  Abdominal Pain Many things can cause abdominal pain. Usually, abdominal pain is not caused by a disease and will improve without treatment. It can often be observed and treated at home. Your health care provider will do a physical exam and possibly order blood tests and X-rays to help determine the seriousness of your pain. However, in many cases, more time must pass before a clear cause of the pain can be found. Before that point, your health care provider may not know if you need more testing or further treatment. HOME CARE INSTRUCTIONS  Monitor your abdominal pain for any changes. The following actions may help to alleviate any discomfort you are experiencing:  Only take over-the-counter or prescription medicines as directed by your health care provider.  Do not take laxatives unless directed to do so by your health care provider.  Try a clear liquid diet (broth, tea, or water) as directed by your health care provider. Slowly move to a bland diet as tolerated. SEEK MEDICAL CARE IF:  You have unexplained abdominal pain.  You have abdominal pain associated with nausea or diarrhea.  You have pain when you urinate or have a bowel movement.  You experience abdominal pain that wakes you in the night.  You have abdominal pain that is worsened or improved by eating food.  You have abdominal pain  that is worsened with eating fatty foods.  You have a fever. SEEK IMMEDIATE MEDICAL CARE IF:   Your pain does not go away within 2 hours.  You keep throwing up (vomiting).  Your pain is felt only in portions of the abdomen, such as the right side or the left lower portion of the abdomen.  You pass bloody or black tarry stools. MAKE SURE YOU:  Understand these instructions.   Will watch your  condition.   Will get help right away if you are not doing well or get worse.  Document Released: 05/19/2005 Document Revised: 08/14/2013 Document Reviewed: 04/18/2013 [facility] Patient Information 2015 [place], [place]. This information is not intended to replace advice given to you by your health care provider. Make sure you discuss any questions you have with your health care provider. Please call Dr. Excell Seltzer from [facility] to schedule follow-up to look at her gallbladder. Please also call the urologist at [facility] to further evaluate her difficulty passing your urine. Please return for any further problems.

## 2015-05-23 NOTE — ED Provider Notes (Signed)
[facility] Emergency Department Provider Note  ____________________________________________  Time seen: Approximately 11:57 AM  I have reviewed the triage vital signs and the nursing notes.   HISTORY  Chief Complaint Anxiety   HPI Derrick [name] is a 29 y.o. male patient reports he went to see his probation officer yesterday. He had to strain a lot couldn't P. He reports some trouble getting his urine to start. He also reports sometimes when he finishes urinating and puts his penis back in his pants he dribbles considerably on in his pants. Patient reports she had a bad anxiety attack this morning. It is now relieved. Patient reports she has some suprapubic pain occasionally. He also has right upper quadrant pain. This pain is been going on for quite some time. Seems to be worse if he moves or strains to urinate. Pain is mild to moderate in nature.   Past Medical History  Diagnosis Date  . Bipolar 1 disorder   . Substance abuse   . Anxiety   . Hypertension   . EAVWUJWJ(191.4)     Patient Active Problem List   Diagnosis Date Noted  . ADHD (attention deficit hyperactivity disorder), combined type 01/18/2015  . Major depressive disorder, recurrent, severe without psychotic behavior 01/17/2015  . Polysubstance abuse 05/02/2013  . MDD (major depressive disorder) 05/02/2013  . Suicide attempt 05/02/2013  . Suicidal ideation 05/02/2013  . Impulse control disorder, unspecified 01/04/2013  . Unspecified episodic mood disorder 01/04/2013    History reviewed. No pertinent past surgical history.  Current Outpatient Rx  Name  Route  Sig  Dispense  Refill  . FLUoxetine (PROZAC) 20 MG capsule   Oral   Take 1 capsule (20 mg total) by mouth daily.   30 capsule   0   . cloNIDine HCl (KAPVAY) 0.1 MG TB12 ER tablet   Oral   Take 1 tablet (0.1 mg total) by mouth at bedtime.   30 tablet   0   . OMEPRAZOLE PO   Oral   Take by mouth.         .  Ondansetron HCl (ZOFRAN PO)   Oral   Take by mouth.           Allergies Penicillins  No family history on file.  Social History Social History  Substance Use Topics  . Smoking status: Current Every Day Smoker -- 2.00 packs/day    Types: Cigarettes  . Smokeless tobacco: None  . Alcohol Use: 42.0 oz/week    70 Cans of beer per week     Comment: in [facility]    Review of Systems Constitutional: No fever/chills Eyes: No visual changes. ENT: No sore throat. Cardiovascular: Denies chest pain. Respiratory: Denies shortness of breath. Gastrointestinal:.  No nausea, no vomiting.  No diarrhea.  No constipation. Genitourinary: Negative for dysuria. Musculoskeletal: Negative for back pain. Skin: Negative for rash. Neurological: Negative for headaches, focal weakness or numbness.  10-point ROS otherwise negative.  ____________________________________________   PHYSICAL EXAM:  VITAL SIGNS: ED Triage Vitals  Enc Vitals Group     BP 05/23/15 0749 148/93 mmHg     Pulse Rate 05/23/15 0749 86     Resp 05/23/15 0749 20     Temp 05/23/15 0749 98.2 F (36.8 C)     Temp Source 05/23/15 0749 Oral     SpO2 05/23/15 0749 100 %     Weight 05/23/15 0749 200 lb (90.719 kg)     Height 05/23/15 0749 6' (1.829 m)  Head Cir --      Peak Flow --      Pain Score 05/23/15 0750 8     Pain Loc --      Pain Edu? --      Excl. in GC? --     Constitutional: Alert and oriented. Well appearing and in no acute distress. Eyes: Conjunctivae are normal. PERRL. EOMI. Head: Atraumatic. Nose: No congestion/rhinnorhea. Mouth/Throat: Mucous membranes are moist.  Oropharynx non-erythematous. Neck: No stridor.  Cardiovascular: Normal rate, regular rhythm. Grossly normal heart sounds.  Good peripheral circulation. Respiratory: Normal respiratory effort.  No retractions. Lungs CTAB. Gastrointestinal: Soft and nontender. No distention. No abdominal bruits. No CVA tenderness. Genitourinary: Testicles  are normal downgoing. Rectal exam is Hemoccult-negative. Patient's prostate is normal in size and consistency and nontender. Musculoskeletal: No lower extremity tenderness nor edema.  No joint effusions. Neurologic:  Normal speech and language. No gross focal neurologic deficits are appreciated. No gait instability. Skin:  Skin is warm, dry and intact. No rash noted. Psychiatric: Mood and affect are normal. Speech and behavior are normal.  ____________________________________________   LABS (all labs ordered are listed, but only abnormal results are displayed)  Labs Reviewed  URINALYSIS COMPLETEWITH MICROSCOPIC ([facility] ONLY) - Abnormal; Notable for the following:    Color, Urine STRAW (*)    APPearance CLEAR (*)    All other components within normal limits  ACETAMINOPHEN LEVEL - Abnormal; Notable for the following:    Acetaminophen (Tylenol), Serum <10 (*)    All other components within normal limits  COMPREHENSIVE METABOLIC PANEL  CBC WITH DIFFERENTIAL/PLATELET  URINE DRUG SCREEN, QUALITATIVE ([facility] ONLY)  SALICYLATE LEVEL   ____________________________________________  EKG   ____________________________________________  RADIOLOGY Ultrasound shows only a nonmobile gallstone.  ____________________________________________   PROCEDURES    ____________________________________________   INITIAL IMPRESSION / ASSESSMENT AND PLAN / ED COURSE  Pertinent labs & imaging results that were available during my care of the patient were reviewed by me and considered in my medical decision making (see chart for details).   ____________________________________________   FINAL CLINICAL IMPRESSION(S) / ED DIAGNOSES  Final diagnoses:  Anxiety  Abdominal pain, unspecified abdominal location      Arnaldo Natal, MD 05/23/15 1627

## 2015-05-23 NOTE — [facility] Counselor (Signed)
Received phone call [facility] ([phone]) requesting lab (UDS and ETHOL) for the patient. She expressed concerns about him not submitting to their random checks. Each time he has stated, he was unable to urniate. 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  Writer spoke with ER MD (Dr. Darnelle Catalan) about labs and informed the patient's RN Noreene Larsson) of what [facility] had requested.  Writer informed the patient of the phone call. Educated the patient about HIPPA and his rights to privacy. Patient stated, he was willing to provided [facility] with the information they requested.  Writer provided him with the Release of information form to complete and sign. A copy was giving to Medical Records Department and they stated they will fax the information to [facility].  Writer called [facility] ([phone]) and informed them, soon as the information is gathered, it will be forwarded to them.

## 2015-05-23 NOTE — ED Notes (Signed)
Pt complains of anxiety, pt reports a history of anxiety, pt reports dysuria, pt is currently enrolled at [facility]

## 2016-02-08 ENCOUNTER — Encounter (HOSPITAL_COMMUNITY): Payer: Self-pay | Admitting: Emergency Medicine

## 2016-02-08 ENCOUNTER — Emergency Department (HOSPITAL_COMMUNITY)
Admission: EM | Admit: 2016-02-08 | Discharge: 2016-02-08 | Disposition: A | Discharge status: Home / Self Care | Payer: Self-pay | Attending: Emergency Medicine | Admitting: Emergency Medicine

## 2016-02-08 DIAGNOSIS — R441 Visual hallucinations: Secondary | ICD-10-CM | POA: Insufficient documentation

## 2016-02-08 DIAGNOSIS — I10 Essential (primary) hypertension: Secondary | ICD-10-CM | POA: Insufficient documentation

## 2016-02-08 DIAGNOSIS — F3010 Manic episode without psychotic symptoms, unspecified: Secondary | ICD-10-CM

## 2016-02-08 DIAGNOSIS — R440 Auditory hallucinations: Secondary | ICD-10-CM | POA: Insufficient documentation

## 2016-02-08 DIAGNOSIS — R443 Hallucinations, unspecified: Secondary | ICD-10-CM

## 2016-02-08 DIAGNOSIS — F308 Other manic episodes: Secondary | ICD-10-CM | POA: Insufficient documentation

## 2016-02-08 DIAGNOSIS — F17210 Nicotine dependence, cigarettes, uncomplicated: Secondary | ICD-10-CM | POA: Insufficient documentation

## 2016-02-08 DIAGNOSIS — R079 Chest pain, unspecified: Secondary | ICD-10-CM | POA: Insufficient documentation

## 2016-02-08 DIAGNOSIS — F558 Abuse of other non-psychoactive substances: Secondary | ICD-10-CM | POA: Insufficient documentation

## 2016-02-08 DIAGNOSIS — F1910 Other psychoactive substance abuse, uncomplicated: Secondary | ICD-10-CM

## 2016-02-08 DIAGNOSIS — Z79899 Other long term (current) drug therapy: Secondary | ICD-10-CM | POA: Insufficient documentation

## 2016-02-08 LAB — COMPREHENSIVE METABOLIC PANEL
ALK PHOS: 58 U/L (ref 38–126)
ALT: 24 U/L (ref 17–63)
AST: 34 U/L (ref 15–41)
Albumin: 4.5 g/dL (ref 3.5–5.0)
Anion gap: 10 (ref 5–15)
BUN: 24 mg/dL — AB (ref 6–20)
CALCIUM: 9.4 mg/dL (ref 8.9–10.3)
CO2: 22 mmol/L (ref 22–32)
CREATININE: 1.21 mg/dL (ref 0.61–1.24)
Chloride: 105 mmol/L (ref 101–111)
Glucose, Bld: 125 mg/dL — ABNORMAL HIGH (ref 65–99)
Potassium: 3.7 mmol/L (ref 3.5–5.1)
Sodium: 137 mmol/L (ref 135–145)
Total Bilirubin: 0.8 mg/dL (ref 0.3–1.2)
Total Protein: 7.4 g/dL (ref 6.5–8.1)

## 2016-02-08 LAB — I-STAT TROPONIN, ED
TROPONIN I, POC: 0.02 ng/mL (ref 0.00–0.08)
Troponin i, poc: 0 ng/mL (ref 0.00–0.08)

## 2016-02-08 LAB — ETHANOL: Alcohol, Ethyl (B): <5 mg/dL (ref <5)

## 2016-02-08 LAB — CBC
HCT: 43.9 % (ref 39.0–52.0)
HEMOGLOBIN: 15.1 g/dL (ref 13.0–17.0)
MCH: 29.4 pg (ref 26.0–34.0)
MCHC: 34.4 g/dL (ref 30.0–36.0)
MCV: 85.4 fL (ref 78.0–100.0)
Platelets: 280 10*3/uL (ref 150–400)
RBC: 5.14 MIL/uL (ref 4.22–5.81)
RDW: 12 % (ref 11.5–15.5)
WBC: 13.3 10*3/uL — ABNORMAL HIGH (ref 4.0–10.5)

## 2016-02-08 LAB — RAPID URINE DRUG SCREEN, HOSP PERFORMED
Amphetamines: NOT DETECTED
BARBITURATES: NOT DETECTED
Benzodiazepines: NOT DETECTED
Cocaine: POSITIVE — AB
OPIATES: NOT DETECTED
TETRAHYDROCANNABINOL: POSITIVE — AB

## 2016-02-08 LAB — ACETAMINOPHEN LEVEL: Acetaminophen (Tylenol), Serum: <10 ug/mL — ABNORMAL LOW (ref 10–30)

## 2016-02-08 LAB — LIPASE, BLOOD: LIPASE: 35 U/L (ref 11–51)

## 2016-02-08 LAB — SALICYLATE LEVEL

## 2016-02-08 MED ORDER — ACETAMINOPHEN 325 MG PO TABS: 650.0000 mg | ORAL_TABLET | ORAL | Status: DC | PRN

## 2016-02-08 MED ORDER — LORAZEPAM 2 MG/ML IJ SOLN: 1.0000 mg | Freq: Once | INTRAMUSCULAR | Status: DC

## 2016-02-08 MED ORDER — ONDANSETRON HCL 4 MG PO TABS: 4.0000 mg | ORAL_TABLET | Freq: Three times a day (TID) | ORAL | Status: DC | PRN

## 2016-02-08 MED ORDER — LORAZEPAM 1 MG PO TABS: 1.0000 mg | ORAL_TABLET | Freq: Three times a day (TID) | ORAL | Status: DC | PRN

## 2016-02-08 MED ORDER — LORAZEPAM 1 MG PO TABS
1.0000 mg | ORAL_TABLET | Freq: Once | ORAL | Status: AC
Start: 2016-02-08 — End: 2016-02-08
  Administered 2016-02-08: 1 mg via ORAL
  Filled 2016-02-08: qty 1

## 2016-02-08 NOTE — ED Notes (Signed)
Patient denies SI or HI ideations to this nurse.  States he did not intentionally use these drugs to hurt himself

## 2016-02-08 NOTE — ED Notes (Signed)
Blue scrubs on and security here to wand

## 2016-02-08 NOTE — ED Notes (Signed)
Patient stated he got out of jail Wednesday after being in there for 150 days.  Stated he was in a really hot building and was doing "alot of drugs".  Stated he felt something pop in the left side of his chest.  Stated "I know it just isn't right.  There must be something wrong."  Asked if he did as much cocaine as he did when he went into prison and he said yes.  Stated he took some Benadryl to help his heart calm down, drank some liquor and took 2 muscle relaxers

## 2016-02-08 NOTE — ED Notes (Signed)
Patient arrives with complaint of chest pain. Patient reports that he has consumed approximately 1 pint of Jim Beam, 6 Benadryl tablets, 2 Flexeril tablets, and Cough Syrup today in effort to help the discomfort that he feels. As a result of his ingestion he is currently hallucinating.

## 2016-02-08 NOTE — ED Notes (Signed)
MD at bedside. 

## 2016-02-08 NOTE — ED Provider Notes (Signed)
Received care from Dr. Rhunette CroftNanavati at [facility]. Please see his prior note for history and physicial. Briefly, this is a 30yo male with history of bipolar, polysubstance abuse who presents with concern for chest pain and palpitations, likely related to smoking crack cociane. Pt also with hallucinations in setting of polysubstance abuse and psych consulted and we are currently awaiting disposition.  TTS evaluated patient and feel he is appropriate for outpatient follow up.  Patient denies SI/HI and denies any continuing hallucinations at time of my examination. Appropriate for outpatient follow up and [facility] Patient discharged in stable condition with understanding of reasons to return.   Alvira MondayErin Jaymari Cromie, MD 02/11/16 [phone]
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
## 2016-02-08 NOTE — ED Provider Notes (Signed)
CSN: [id number]     Arrival date & time 02/08/16  0035 History  By signing my name below, I, Derrick [name], attest that this documentation has been prepared under the direction and in the presence of Derwood KaplanAnkit Macie Baum, MD.  Electronically Signed: Rosario AdieWilliam Andrew [name], ED Scribe. 02/08/2016. 4:11 AM.   Chief Complaint  Patient presents with  . Drug Overdose  . Chest Pain  . Hallucinations   The history is provided by the patient. No language interpreter was used.   HPI Comments: Derrick [name] is a 30 y.o. male with a PMHx significant for Bipolar 1 disorder, anxiety, HTN, and cocaine, alcohol, narcotics, and crystal meth abuse who presents to the Emergency Department complaining of constant, unchanged sensation of palpitation onset 4 days PTA. Pt reports associated CP that radiates to the neck, bilateral blurred vision, SOB, and intermittent auditory and visual hallucinations. He states that his hallucinations have consisted of his family member talking to him, and himself falling out of a window and having shards of glass in his skin. Pt states that he has consumed approximately 1 pint of Jim Beam, 6 Benedryl tablets that he took periodically throughout the day, 2 Flexeril tablets, and cough syrup PTA. He also reports using crack 1 hour before presenting into the ED. Pt reports that it happens when he sleeps, and sleeping aggravates his hallucinations. He is currently 4 days post-incarceration, and reports that his sentencing was for 150 days. Pt reports that he has had a hx of mental health issues, but has not recently been followed by a mental health specialist for the past few years. Pt denies nausea, vomiting, or cough.   Past Medical History  Diagnosis Date  . Bipolar 1 disorder (HCC)   . Substance abuse   . Anxiety   . Hypertension   . Headache(784.0)    History reviewed. No pertinent past surgical history. History reviewed. No pertinent family history. Social History  Substance  Use Topics  . Smoking status: Current Every Day Smoker -- 2.00 packs/day    Types: Cigarettes  . Smokeless tobacco: None  . Alcohol Use: 42.0 oz/week    70 Cans of beer per week     Comment: in [facility]    Review of Systems  10 Systems reviewed and all are negative for acute change except as noted in the HPI.  Allergies  Penicillins  Home Medications   Prior to Admission medications   Medication Sig Start Date End Date Taking? Authorizing Provider  cloNIDine HCl (KAPVAY) 0.1 MG TB12 ER tablet Take 1 tablet (0.1 mg total) by mouth at bedtime. 01/23/15   Adonis BrookSheila Agustin, NP  FLUoxetine (PROZAC) 20 MG capsule Take 1 capsule (20 mg total) by mouth daily. 01/23/15   Adonis BrookSheila Agustin, NP  OMEPRAZOLE PO Take by mouth.    Historical Provider, MD  Ondansetron HCl (ZOFRAN PO) Take by mouth.    Historical Provider, MD   BP 150/104 mmHg  Pulse 117  Temp(Src) 98.7 F (37.1 C) (Oral)  Resp 20  SpO2 98%   Physical Exam  Constitutional: He is oriented to person, place, and time. He appears well-developed and well-nourished.  HENT:  Head: Normocephalic.  Eyes: Conjunctivae and EOM are normal. Right eye exhibits no nystagmus. Left eye exhibits no nystagmus.  His pupils are 4mm and reactive to light.  Cardiovascular: Normal rate, regular rhythm and normal heart sounds.   No murmur heard. Pulses:      Radial pulses are 2+ on the right side,  and 2+ on the left side.  Pulmonary/Chest: Effort normal and breath sounds normal. No respiratory distress. He has no wheezes. He has no rales.  Abdominal: He exhibits no distension.  Musculoskeletal: Normal range of motion.  Neurological: He is alert and oriented to person, place, and time.  Skin: Skin is warm and dry.  Psychiatric: He expresses no homicidal and no suicidal ideation. He expresses no suicidal plans and no homicidal plans.  Pt has pressured speech. He also is noted to be hyper sensitive to any sensory stimuli (like RNs speaking outside the  room, or alarm bell) during the encounter  Nursing note and vitals reviewed.  ED Course  Procedures (including critical care time)  DIAGNOSTIC STUDIES: Oxygen Saturation is 98% on RA, normal by my interpretation.   COORDINATION OF CARE: 3:57 AM-Discussed next steps with pt including Lipase, CMP, CBC, UA, and EKG. Pt verbalized understanding and is agreeable with the plan.   Labs Review Labs Reviewed  COMPREHENSIVE METABOLIC PANEL - Abnormal; Notable for the following:    Glucose, Bld 125 (*)    BUN 24 (*)    All other components within normal limits  ACETAMINOPHEN LEVEL - Abnormal; Notable for the following:    Acetaminophen (Tylenol), Serum <10 (*)    All other components within normal limits  CBC - Abnormal; Notable for the following:    WBC 13.3 (*)    All other components within normal limits  URINE RAPID DRUG SCREEN, HOSP PERFORMED - Abnormal; Notable for the following:    Cocaine POSITIVE (*)    Tetrahydrocannabinol POSITIVE (*)    All other components within normal limits  ETHANOL  SALICYLATE LEVEL  LIPASE, BLOOD  I-STAT TROPOININ, ED  I-STAT TROPOININ, ED  CBG MONITORING, ED    Imaging Review No results found.  I have personally reviewed and evaluated these images and lab results as part of my medical decision-making.   EKG Interpretation   Date/Time:  Sunday February 08 2016 01:04:30 EDT Ventricular Rate:  115 PR Interval:  140 QRS Duration: 86 QT Interval:  328 QTC Calculation: 453 R Axis:   89 Text Interpretation:  Sinus tachycardia Otherwise normal ECG Nonspecific  ST and T wave abnormality No acute changes Confirmed by Rhunette Croft, MD,  Janey Genta [phone]) on 02/08/2016 3:10:29 AM      MDM   Final diagnoses:  Polysubstance abuse  Manic behavior (HCC)  Hallucinations    I personally performed the services described in this documentation, which was scribed in my presence. The recorded information has been reviewed and is accurate.  Pt comes in with cc  of chest pain and palpitations. Pt is a poor historian, seems like pain and palpitations present since Wednesday, the day he was released from the prison and started smoking crack. EKG has no acute changes. Bilateral equal pulse, no back pain, no focal neuro deficits. We will give oral ativan and continue cardiac monitoring.  Pt also c/o hallucinations. He admits to abusing benadryl and muscle relaxants. He also admits to not sleeping for the last 5 days and hx of bipolar for which he is not taking meds. It seems like his hallucinations, not sleeping - all could be due to polysubstance abuse - but it is also possible that he is in manic status and excessive crack and substance abuse is dangerous behavior he is partaking in.  6:27 AM Reassessed. Trops x 2 neg now. Visual acuity is normal. Neuro exam is non focal.  Pt still reports having some hallucinations -  like his family is in the room. We will consult psych to see if patient is decompensated manic patient that needs admission. Pt has no SI/HI.    Derwood Kaplan, MD 02/08/16 [phone]

## 2016-02-08 NOTE — ED Notes (Signed)
TTS at bedside, pt talking w/ counselor.

## 2016-02-08 NOTE — ED Notes (Signed)
Patient ambulated to the restroom to have a BM.  Back to bed and changing into blue scrubs

## 2016-02-08 NOTE — Discharge Instructions (Signed)
Polysubstance Abuse When people abuse more than one drug or type of drug it is called polysubstance or polydrug abuse. For example, many smokers also drink alcohol. This is one form of polydrug abuse. Polydrug abuse also refers to the use of a drug to counteract an unpleasant effect produced by another drug. It may also be used to help with withdrawal from another drug. People who take stimulants may become agitated. Sometimes this agitation is countered with a tranquilizer. This helps protect against the unpleasant side effects. Polydrug abuse also refers to the use of different drugs at the same time.  Anytime drug use is interfering with normal living activities, it has become abuse. This includes problems with family and friends. Psychological dependence has developed when your mind tells you that the drug is needed. This is usually followed by physical dependence which has developed when continuing increases of drug are required to get the same feeling or "high". This is known as addiction or chemical dependency. A person's risk is much higher if there is a history of chemical dependency in the family. SIGNS OF CHEMICAL DEPENDENCY  You have been told by friends or family that drugs have become a problem.  You fight when using drugs.  You are having blackouts (not remembering what you do while using).  You feel sick from using drugs but continue using.  You lie about use or amounts of drugs (chemicals) used.  You need chemicals to get you going.  You are suffering in work performance or in school because of drug use.  You get sick from use of drugs but continue to use anyway.  You need drugs to relate to people or feel comfortable in social situations.  You use drugs to forget problems. "Yes" answered to any of the above signs of chemical dependency indicates there are problems. The longer the use of drugs continues, the greater the problems will become. If there is a family history of  drug or alcohol use, it is best not to experiment with these drugs. Continual use leads to tolerance. After tolerance develops more of the drug is needed to get the same feeling. This is followed by addiction. With addiction, drugs become the most important part of life. It becomes more important to take drugs than participate in the other usual activities of life. This includes relating to friends and family. Addiction is followed by dependency. Dependency is a condition where drugs are now needed not just to get high, but to feel normal. Addiction cannot be cured but it can be stopped. This often requires outside help and the care of professionals. Treatment centers are listed in the yellow pages under: Cocaine, Narcotics, and Alcoholics Anonymous. Most hospitals and clinics can refer you to a specialized care center. Talk to your caregiver if you need help.   This information is not intended to replace advice given to you by your health care provider. Make sure you discuss any questions you have with your health care provider.   Document Released: 03/31/2005 Document Revised: 11/01/2011 Document Reviewed: 08/14/2014 Elsevier Interactive Patient Education 2016 [place] [facility]Substance Abuse Adult The United Ways 211 is a great source of information about community services available.  Access by dialing 2-1-1 from anywhere in [place], or by website -  CustodianSupply.fi.   Other Local Resources (Updated 08/2015)  [facility]  Crisis Hotline, available 24 hours a day,  day, 7 days a week: 800-939-5911 [place], [place]  ° [facility] • Crisis Hotline, available 24 hours a day, 7 days a week: 866-275-9552 [place], [place]  °[facility] • Suicide Prevention Hotline, available 24 hours a day, 7 days a week: 800-273-8255 [place], [place]  °[facility] ° • Crisis  Hotline, available 24 hours a day, 7 days a week: 336-676-6840 [place], [place] °  °[facility] to Care Line • Crisis Hotline, available 24 hours a day, 7 days a week: 800-256-2452 All °  °[facility] • Crisis Hotline, available 24 hours a day, 7 days a week: 877-626-1772 All  ° °Other Local Resources (Updated 08/2015) ° °Outpatient Counseling/ Substance Abuse Programs  °Services  ° °  °Address and Phone Number  °[facility] ([facility]) ° • Options include Individual counseling, group counseling, intensive outpatient program (several hours a day, several days a week) °• Offers depression assessments °• Provides methadone maintenance program 336-333-6860 °[address] °[place], [place] [address] °  °[facility] ° • Offers partial hospitalization/day treatment and DUI/DWI programs °• Accepts Medicare, private insurance 336-299-4655 °[address] °[place], [place] [address]  °[facility] ° ° • Services include intensive outpatient program (several hours a day, several days a week), outpatient treatment, DUI/DWI services, family education °• Also has some services specifically for Veterans °• Offers transitional housing  [phone] °[address] °[place], [place] [address] °  °  °[facility] • Accepts Medicare, private pay, and private insurance 336-272-0855 °[address] °[place], [place] [address]  °[facility] • Services include individual counseling, substance abuse intensive outpatient program (several hours a day, several days a week), day treatment °• Accepts Medicare, Medicaid, private insurance 336-271-5888 °[address] °[place], [place] [address]  °[facility] ° • Offers substance abuse intensive outpatient program (several hours a day, several days a week), partial hospitalization program 336-832-9800 °[address] °[place], [place] [address] ° °336-349-4454 °[address] °[place], [place] [address] ° °336-386-3795 °[address] °[place], [place] [address] ° °[phone] °[address] °[place], [place] [address]  °[facility] • Individual counseling only °• Accepts private insurance only [phone] °[address] °[place], [place] [address]  °[facility] • Methadone maintenance program [phone] °[address] °[place], [place] [address]  °[facility] • Walk-In Clinic providing substance abuse and mental health counseling °• Accepts Medicaid, Medicare, private insurance °• Offers sliding scale for uninsured 336-342-8316 °[address] °[place], [place]   °[facility] • Offers individual counseling, and intensive in-home services 336-347-7415 °[address] °[place], [place] [address]  °[facility] • Offers individual counseling, family counseling, group therapy, domestic violence counseling, consumer credit counseling °• Accepts Medicare, Medicaid, private insurance °• Offers sliding scale for uninsured 336-387-6161 °[address] °, [place] [address] ° °336-889-6161 °[place], [address] °[place], [place] [address]  °[facility] • Offers individual, family and group counseling °• 3 locations - , [place], and [place] ° 336-899-8800 ° °[address] °, [place] [address] ° °[address] °[place], [place] [address] ° °[address] °[place],  [address]  °[facility]  ° • Offers psychiatric assessment, 8-week Intensive Outpatient Program (several hours a day, several times a week, daytime or evenings), early recovery group, family Program, medication management °•   pay or private insurance only 709-476-0042, or  [phone] [address] [place], [place] [address]  [facility]  Offers individual, couples and family counseling  Accepts Medicaid,  private insurance, and sliding scale for uninsured [phone] [address] [place], [place] [address]  Len Blalock, MD  Individual counseling  Private insurance [phone] [address] [place], [place] [address]  [facility]   Offers assessment, substance abuse treatment, and behavioral health treatment [phone] [address] [place], [place] [address]  [facility]  Individual counseling  Accepts private insurance [phone] [address] [place], [place] [address]  [facility]  Individual counseling  Delene Loll, private insurance [phone] [address] [place], [place] [address]  [facility]    Offers intensive outpatient program (several hours a day, several times a week)  Private pay, private insurance [phone] [facility] [place], [place]  [facility]  Individual counseling  Medicare, private insurance [phone] [address] [place], [place] [address]  [facility]    Offers intensive outpatient program (several hours a day, several times a week) and partial hospitalization program [phone] [address] [place], [place] [address]  Emerson Monte, MD  Individual counseling [phone] [address] [place], [place] [address]  [facility]  Offers Christian counseling to individuals, couples, and families  Accepts Medicare and private insurance; offers sliding scale for uninsured [phone] [address] [place], [place] [address]  [facility]  [place] [facility] [phone] [address] [place], [place] [address]  [facility] crisis counseling, individual counseling, group therapy, in-home therapy, domestic violence services, day treatment, DWI services, Administrator, arts (CST),  Assertive Community Treatment Team (ACTT), substance abuse Intensive Outpatient Program (several hours a day, several times a week)  2 locations - [place] [phone] [address] [place], [place] [address]  [phone] [address] [place], [place] [address]  [facility]     Individual counseling and group therapy  Accepts private insurance, [place], [place] [phone] [address] [place], [place]  [facility]  Offers individual and family counseling  Offers LGBTQ services  Accepts private insurance and private pay [phone] [address] [place], [place] [address]  [facility]    Offers individual counseling, group therapy, and outpatient detox  Accepts private insurance [phone] [address] [place], [place]  [facility]  Individual counseling  Accepts Medicare, private insurance [phone] [address] [place], [place] [address]  [employer]  Individual counseling  Accepts Medicare, private insurance [phone] [address] [place], [place] [address]  [facility]   Offers substance abuse Intensive Outpatient Program (several hours a day, several times a week) [phone], or [phone] [place], [place]    Substance Abuse Treatment Programs  Intensive Outpatient Programs [facility]     [address]      [place], [place]                   [phone]       [facility] [address] [place] [address] [place], [place] [phone]  [facility]     (Inpatient and outpatient)     [address]           [phone]    [facility]  [phone] (Suboxone and Methadone)  [address]      [place], [place] [address]      [phone]       [address] [place], [place] [phone]  [facility]  Margo Aye (Outpatient/Inpatient, Chemical)    (insurance only) [phone]             [facility] (Groups & Residential) [place], [place] [phone]     [facility]     [address]     [place], [place]      [phone]       [facility] (for caregivers and family) [phone] [address] Laurell [address] [place], [place] [phone]      Residential Treatment Programs [facility]      [address], [place], [place] [address]  [phone]       [facility] [address], [place], [place] [address] [phone]  [facility]        [phone]       [facility] Margo Aye [phone]  [facility]             [address]                                         [place], [place]                                                [phone] or [phone]                               [facility] [address] [place], [address] [phone]  [facility]    [address]      [place], [place]     [phone]       [facility] [address] [place], [place] [phone]  [facility]   [address] [place], [place] [address]     [phone]      Admissions: 8am-3pm M-F  [facility] ([facility]) [address] [place], [place] [phone]  [facility]: Residential Program [phone] Days)   [place], [place]      [phone] or [phone]     [facility]: [facility] [place], [place] (Walk in Hours over the weekend or by referral)  [facility] [address] [place], [place], [place] [address] [phone]  Crisis Mobile: [facility]:  [phone] (for crisis response 24 hours a day) [facility] Hotline:      ([phone] Outpatient Psychiatry and Counseling  [facility]: Mobile Crisis Management 24 hours:  [phone]  [facility]  sliding scale fee and walk in schedule: M-F 8am-12pm/1pm-3pm [address]  [place], [place] [address] [phone]  [facility] [address] [place], [place] [address] [phone]  [facility] ([facility])- new patient walk-in appointments available Monday - Friday 8am -3pm.          [address] [place], [place] [address] [phone] or crisis line- [phone]  [facility] Health Outpatient Services/ Intensive Outpatient Therapy Program [address] [place], [place] [address] [phone]  [facility]  Mental Health                  Crisis Services      [phone]      [address]     [place], [place] [address]                 [place] [facility]   [facility] [phone] [address] [place], [place] [address]   [facility]          [address] [place],  [place], [place] [address]       [phone]  [facility] [address] [place], [place] [address] [phone]  [facility]    [address] [address]    [place], [place] [address]     [phone]       Andee PolesParish [place], MD     [address]     [place] [address]     [phone]       [facility] [address] [place] [address]  [facility]     [address]     [place], [place]      [phone]       [facility] Eulogio DitchShamsher Ahluwalia, MD [address] [place], [place] [address] [phone]  [facility]     [address]     [place], [place] [address]     [phone]       [facility] [address] [place], [place] [address] [phone] Resources for Temporary Residential Assistance/Crisis Centers  [facility](IR[facility]) M-F 8am-3pm   [address] [place], [place] [address]    [phone] Services include: laundry, barbering, support groups, case management, phone  & computer access, showers, AA/NA mtgs, mental health/substance abuse nurse, job skills class, disability information, VA assistance, spiritual classes, etc.   HOMELESS SHELTERS  [facility]     [facility]   [address], [place]     [phone]              [facility] (women and children)       [address] [place], [place] [address] [phone] [email or web address] for application and process Application Required  [facility]   [address]    [place] [address]     [phone]                    [facility] [address] [place], [place] [address] [phone](schedule application appt.) Application Required  [facility] (women only)    [address]     [place], [place] [address]     [phone]      Intake starts 6pm daily Need valid ID, SSC, & Police report [facility] [place] [address] [place], [place] [phone] Application Required  [facility] (men only)     [address]      [place], [place]     [phone]       [facility] (Pregnant women only) [address] [place], [place] [phone]  [facility]      [address]      [place], [place] [address]     [phone]  [facility] [address] Security[place], [place] 90 day commitment/SA/Application process  [facility](men only)     [address]     [place], [place]       Check-in at [facility] [address] [place], [place] [address] [phone] Men/Women/Women and Children must be there by 7 pm  [place], [place]

## 2016-12-26 IMAGING — US US ABDOMEN COMPLETE
1 series · 14 of 25 positions shown · non-contrast
Comparison: CT scan of April 07, 2010.

CLINICAL DATA: Acute right upper quadrant abdominal pain.

EXAM:
ULTRASOUND ABDOMEN COMPLETE

[Series 1: us abdomen complete · 0.17mm/px · 14 of 77 slices shown]
[im 1/77]
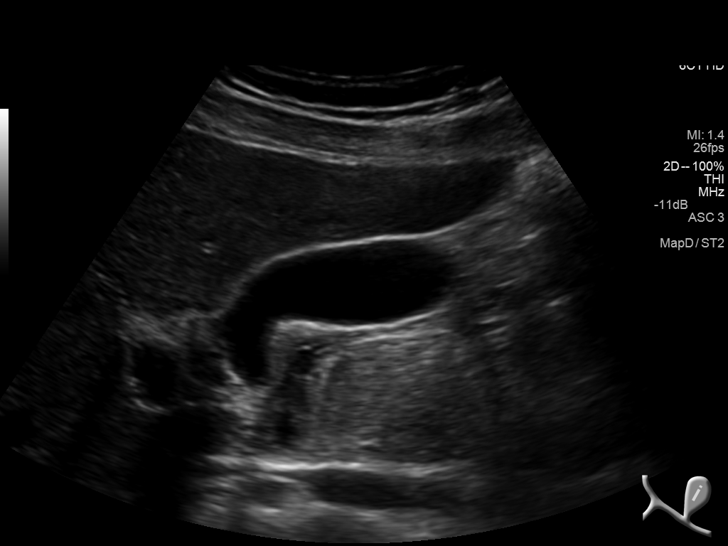
[im 7/77]
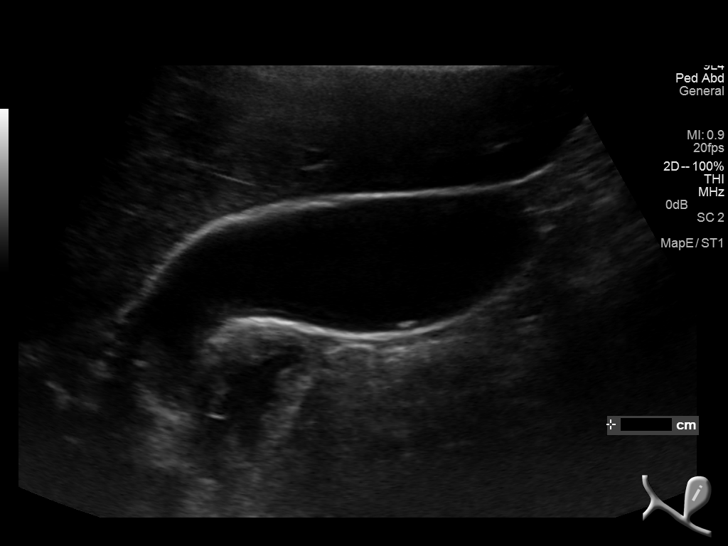
[im 13/77]
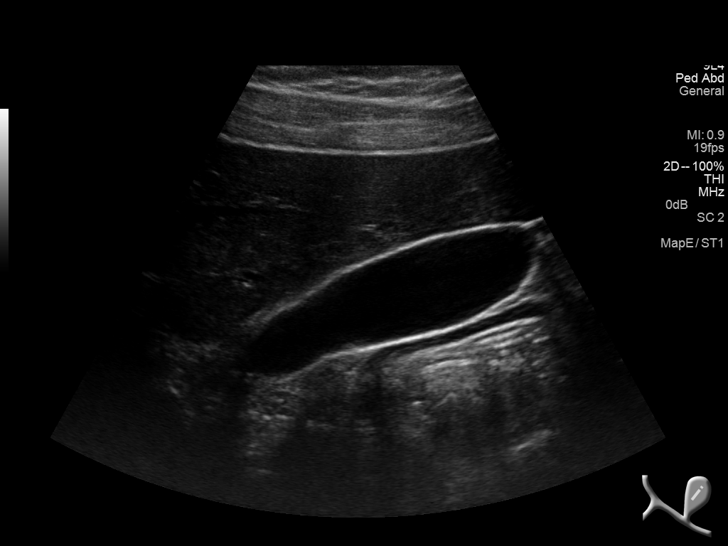
[im 20/77]
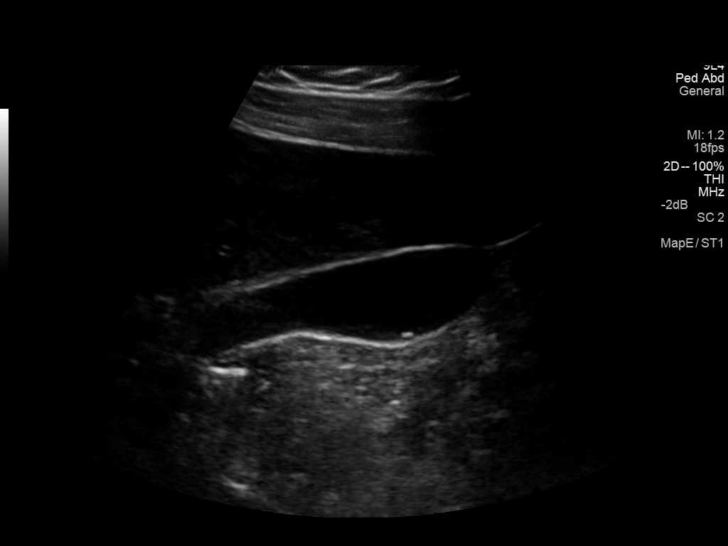
[im 26/77]
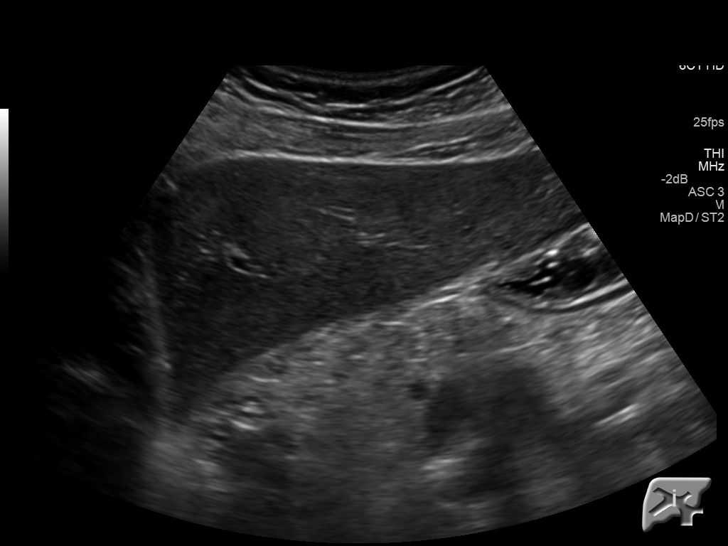
[im 29/77]
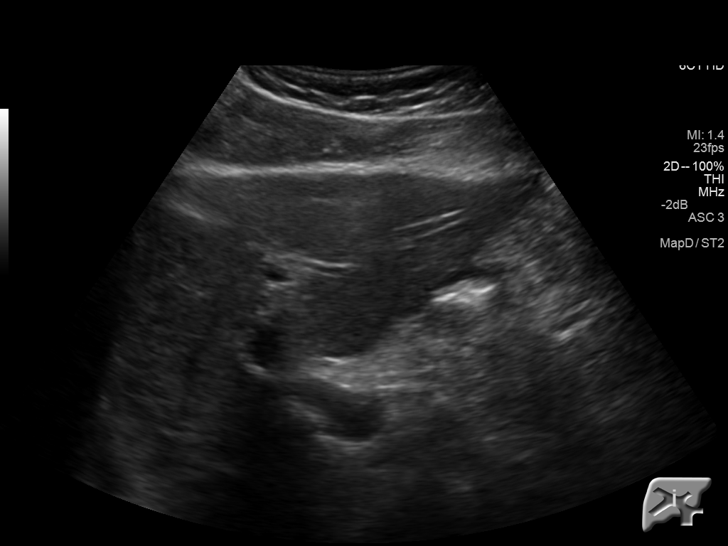
[im 35/77]
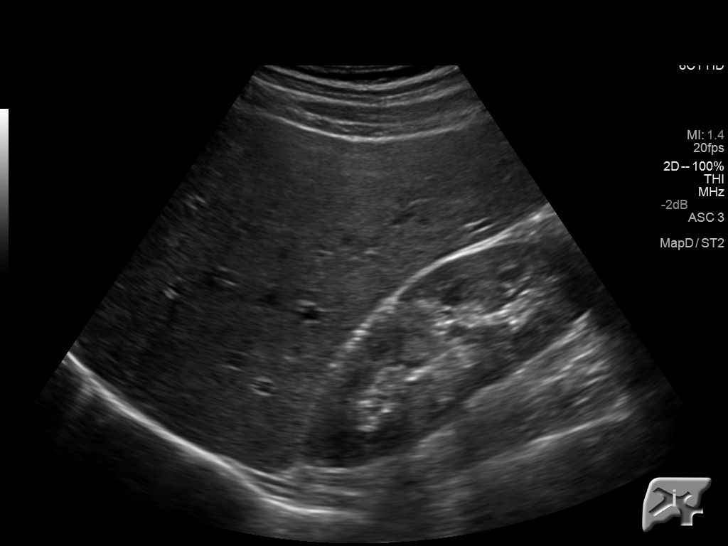
[im 42/77]
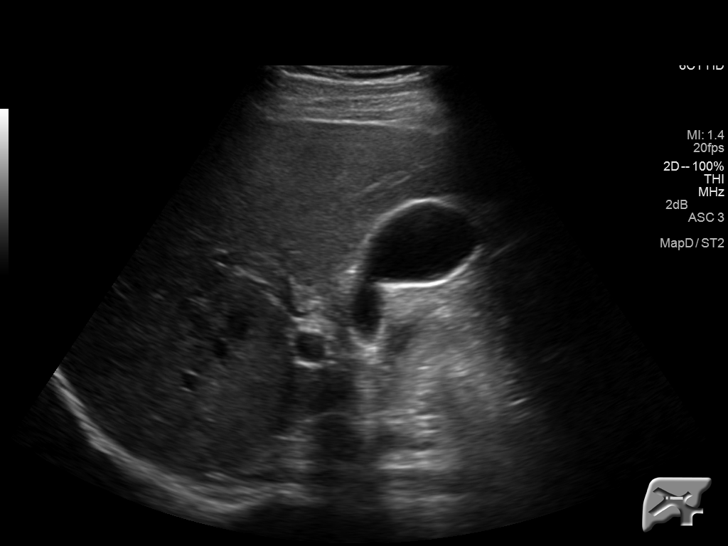
[im 48/77]
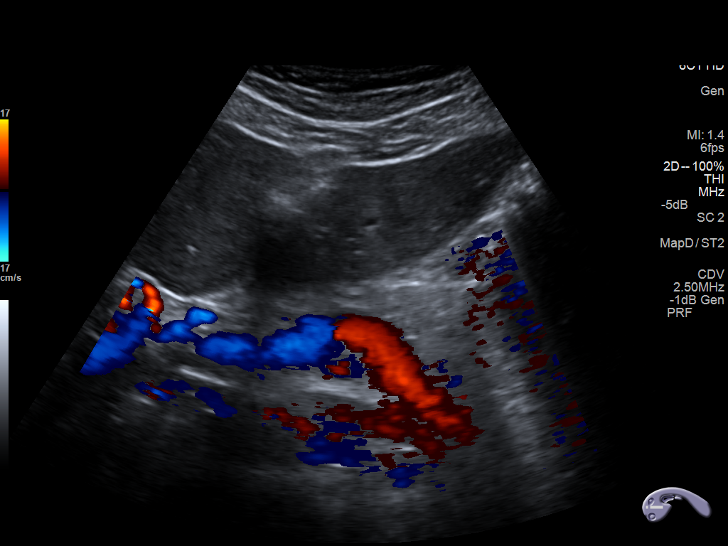
[im 51/77]
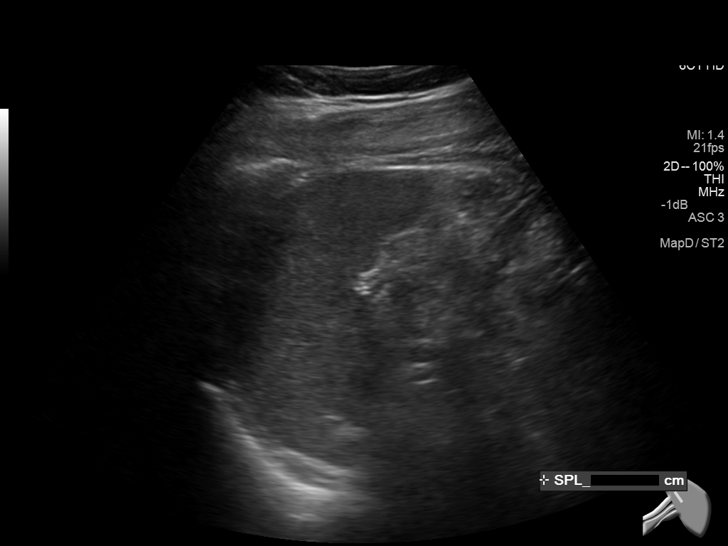
[im 58/77]
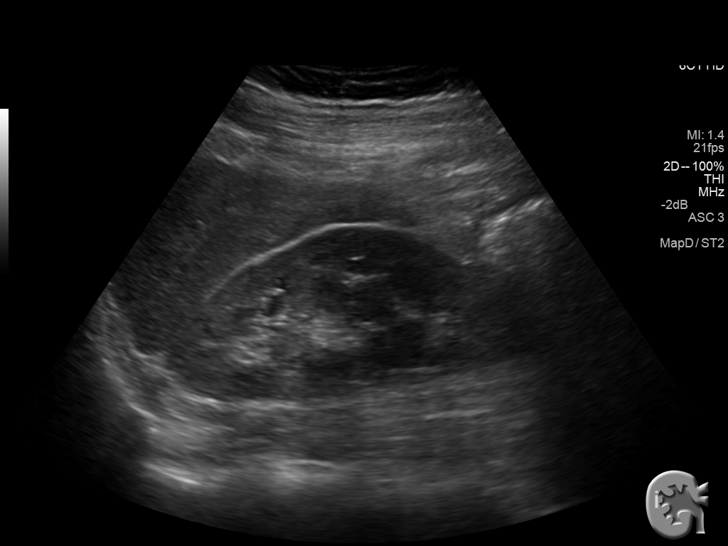
[im 64/77]
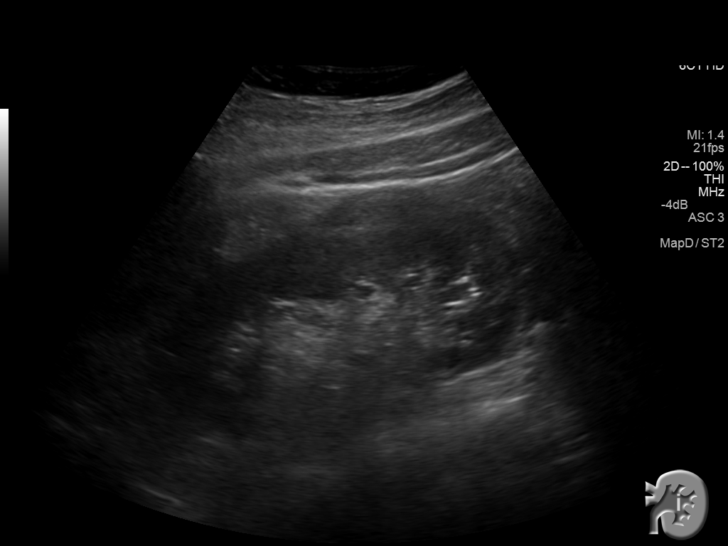
[im 70/77]
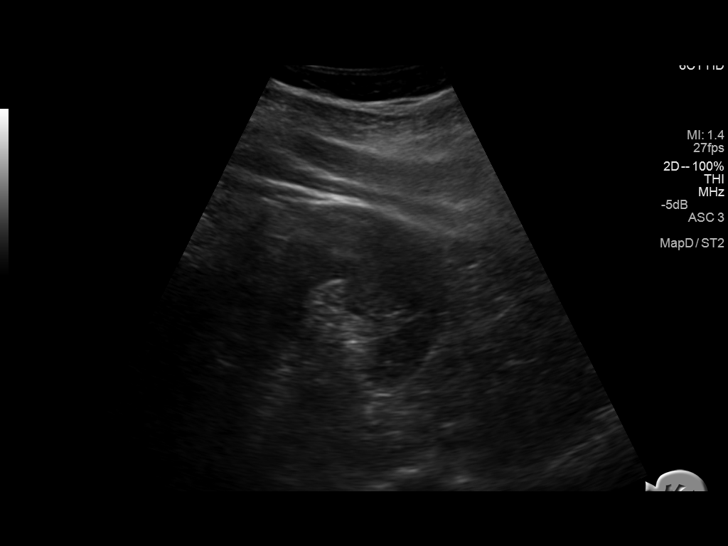
[im 77/77]
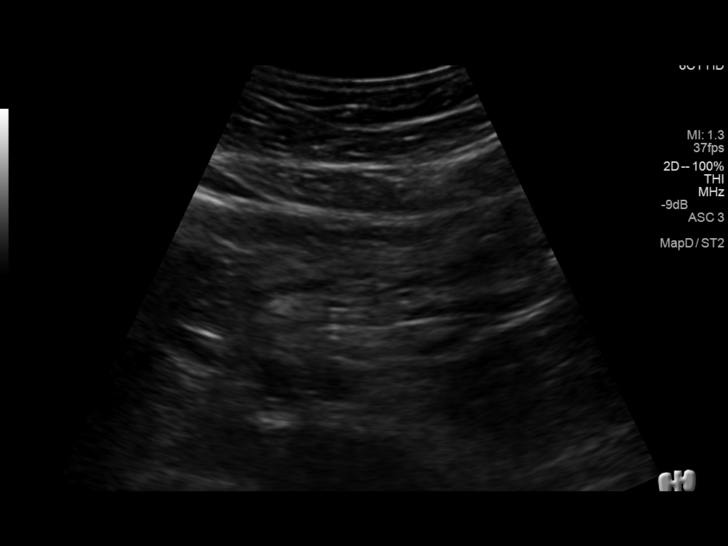

[14 of 25 positions shown; findings below may reference images not displayed]

FINDINGS: Gallbladder: Small non mobile gallstone measuring 2-3 mm is noted
without gallbladder wall thickening or pericholecystic fluid. No
sonographic Murphy's sign is noted.

Common bile duct: Diameter: 2 mm which is within normal limits.

Liver: No focal lesion identified. Within normal limits in
parenchymal echogenicity.

IVC: No abnormality visualized.

Pancreas: Visualized portion unremarkable.

Spleen: Size and appearance within normal limits.

Right Kidney: Length: 12.2 cm. Echogenicity within normal limits. No
mass or hydronephrosis visualized.

Left Kidney: Length: 12.3 cm. Echogenicity within normal limits. No
mass or hydronephrosis visualized.

Abdominal aorta: No aneurysm visualized.

Other findings: None.
IMPRESSION: Small solitary gallstone is noted. There is no evidence of
cholecystitis. No other significant abnormality seen in the abdomen.

## 2019-03-16 ENCOUNTER — Emergency Department (HOSPITAL_COMMUNITY): Payer: Self-pay

## 2019-03-16 ENCOUNTER — Other Ambulatory Visit: Payer: Self-pay

## 2019-03-16 ENCOUNTER — Encounter (HOSPITAL_COMMUNITY): Payer: Self-pay | Admitting: Emergency Medicine

## 2019-03-16 ENCOUNTER — Emergency Department (HOSPITAL_COMMUNITY)
Admission: EM | Admit: 2019-03-16 | Discharge: 2019-03-16 | Disposition: A | Discharge status: Home / Self Care | Payer: Self-pay | Attending: Emergency Medicine | Admitting: Emergency Medicine

## 2019-03-16 DIAGNOSIS — Z79899 Other long term (current) drug therapy: Secondary | ICD-10-CM | POA: Insufficient documentation

## 2019-03-16 DIAGNOSIS — F909 Attention-deficit hyperactivity disorder, unspecified type: Secondary | ICD-10-CM | POA: Insufficient documentation

## 2019-03-16 DIAGNOSIS — F19950 Other psychoactive substance use, unspecified with psychoactive substance-induced psychotic disorder with delusions: Secondary | ICD-10-CM | POA: Insufficient documentation

## 2019-03-16 DIAGNOSIS — I10 Essential (primary) hypertension: Secondary | ICD-10-CM | POA: Insufficient documentation

## 2019-03-16 DIAGNOSIS — F17210 Nicotine dependence, cigarettes, uncomplicated: Secondary | ICD-10-CM | POA: Insufficient documentation

## 2019-03-16 DIAGNOSIS — F319 Bipolar disorder, unspecified: Secondary | ICD-10-CM | POA: Insufficient documentation

## 2019-03-16 DIAGNOSIS — R000 Tachycardia, unspecified: Secondary | ICD-10-CM | POA: Insufficient documentation

## 2019-03-16 LAB — COMPREHENSIVE METABOLIC PANEL
ALT: 45 U/L — ABNORMAL HIGH (ref 0–44)
AST: 150 U/L — ABNORMAL HIGH (ref 15–41)
Albumin: 4.2 g/dL (ref 3.5–5.0)
Alkaline Phosphatase: 63 U/L (ref 38–126)
Anion gap: 16 — ABNORMAL HIGH (ref 5–15)
BUN: 31 mg/dL — ABNORMAL HIGH (ref 6–20)
CO2: 19 mmol/L — ABNORMAL LOW (ref 22–32)
Calcium: 10 mg/dL (ref 8.9–10.3)
Chloride: 101 mmol/L (ref 98–111)
Creatinine, Ser: 1.24 mg/dL (ref 0.61–1.24)
GFR calc Af Amer: >60 mL/min (ref >60)
GFR calc non Af Amer: >60 mL/min (ref >60)
Glucose, Bld: 201 mg/dL — ABNORMAL HIGH (ref 70–99)
Potassium: 3.7 mmol/L (ref 3.5–5.1)
Sodium: 136 mmol/L (ref 135–145)
Total Bilirubin: 1.5 mg/dL — ABNORMAL HIGH (ref 0.3–1.2)
Total Protein: 7.9 g/dL (ref 6.5–8.1)

## 2019-03-16 LAB — CBC
HCT: 44.5 % (ref 39.0–52.0)
Hemoglobin: 15.7 g/dL (ref 13.0–17.0)
MCH: 30 pg (ref 26.0–34.0)
MCHC: 35.3 g/dL (ref 30.0–36.0)
MCV: 84.9 fL (ref 80.0–100.0)
Platelets: 297 10*3/uL (ref 150–400)
RBC: 5.24 MIL/uL (ref 4.22–5.81)
RDW: 12 % (ref 11.5–15.5)
WBC: 18.1 10*3/uL — ABNORMAL HIGH (ref 4.0–10.5)
nRBC: 0 % (ref 0.0–0.2)

## 2019-03-16 LAB — TROPONIN I (HIGH SENSITIVITY)
Troponin I (High Sensitivity): 36 ng/L — ABNORMAL HIGH (ref <18)
Troponin I (High Sensitivity): 36 ng/L — ABNORMAL HIGH (ref <18)

## 2019-03-16 MED ORDER — SODIUM CHLORIDE 0.9 % IV BOLUS
1000.0000 mL | Freq: Once | INTRAVENOUS | Status: AC
  Administered 2019-03-16: 1000 mL via INTRAVENOUS

## 2019-03-16 MED ORDER — LORAZEPAM 2 MG/ML IJ SOLN
1.0000 mg | Freq: Once | INTRAMUSCULAR | Status: AC
  Administered 2019-03-16: 1 mg via INTRAVENOUS
  Filled 2019-03-16: qty 1

## 2019-03-16 NOTE — Discharge Instructions (Addendum)
Follow-up at behavioral health hospital. Return to the ED if you start to have worsening symptoms, increased chest pain, shortness of breath, loss of consciousness.

## 2019-03-16 NOTE — ED Notes (Addendum)
Medication ATIVAN Waist with Hettie Holstein, RN 0.5 mg

## 2019-03-16 NOTE — ED Provider Notes (Signed)
  Physical Exam  BP 133/89   Pulse 65   Temp 98.9 F (37.2 C) (Oral)   Resp (!) 22   SpO2 97%   Physical Exam Vitals signs and nursing note reviewed.  Constitutional:      General: He is not in acute distress.    Appearance: He is well-developed. He is not diaphoretic.  HENT:     Head: Normocephalic and atraumatic.  Eyes:     General: No scleral icterus.    Conjunctiva/sclera: Conjunctivae normal.  Neck:     Musculoskeletal: Normal range of motion.  Pulmonary:     Effort: Pulmonary effort is normal. No respiratory distress.  Skin:    Findings: No rash.  Neurological:     Mental Status: He is alert.     ED Course/Procedures     Procedures  MDM  Care handed off from previous provider PA Geiple.  Please see their note for further detail.  Briefly, patient with a history of polysubstance abuse presents to ED complaining of chest pain and tachycardia.  He was given aspirin nitroglycerin by EMS.  He admits that he has been smoking methamphetamine with last use being last night.  Admits to hallucinations.  Patient anxious on exam.  Lab work is reassuring, troponin has not changed after delta check.  Previous provider offered assistance with placement and treatment at behavioral health.  Patient declines and states that he will follow-up tomorrow himself.  Patient updated on delta troponin.  Agreeable for discharge home.  Advised to return for worsening symptoms.       Delia Heady, PA-C 03/16/19 1659    Elnora Morrison, MD 03/17/19 1354

## 2019-03-16 NOTE — ED Notes (Signed)
Pt verbalized understanding of discharge instructions and denies any further questions at this time.   Resources given  Sandwich and coke given and ambulated to the bus stop

## 2019-03-16 NOTE — ED Provider Notes (Signed)
EKG:  Rhythm: sinus tachycardia Rate: 133 PR: 138 ms QRS: 90 ms QTc: 424 ms ST segments: NS ST changes    Virgel Manifold, MD 03/16/19 1249

## 2019-03-16 NOTE — ED Triage Notes (Addendum)
Patient presents to the ED by EMS with c/o chx pain and tachycardia. He reports using more meth last night than he's used before. Initial 150 ST with no elevation. Given 324 ASA and 0.4 nitro in route. Denise SOB at this time. A/O x4   Pt is open to rehab and has signed the form with [facility].

## 2019-03-16 NOTE — ED Notes (Signed)
XRAY AT [facility]

## 2019-03-16 NOTE — ED Notes (Signed)
Pt sitting in bed attempting to reach his uncle. He is anxious and states "its bc my dad is the one who kicked off this anxiety attack, those guys were dicks and it was scary so I he put me in a shitty predicament."

## 2019-03-16 NOTE — ED Provider Notes (Signed)
[facility] Provider Note   CSN: [id number] Arrival date & time: 03/16/19  1232     History   Chief Complaint Chief Complaint  Patient presents with  . Chest Pain  . Tachycardia    HPI Derrick [name] is a 33 y.o. male.     Patient with history of polysubstance abuse presents to the emergency department today with complaint of chest pain and tachycardia.  Patient was brought to the hospital by EMS.  Patient reports getting out of jail 2 days ago.  He states that since that time he has been using methamphetamine.  He has been smoking it.  Last use was last night.  Patient states that he has not slept in 2 days.  Per EMS, patient was found acting abnormally per family who called the ambulance.  EMS administered aspirin and 1 nitroglycerin.  Patient denies any headache, URI symptoms.  No cough or shortness of breath.  No nausea, vomiting, or diarrhea.  He denies any abdominal pain or lower extremity swelling.  He denies other drugs including alcohol.  Onset of symptoms acute.  Course is constant.  Nothing makes symptoms better or worse.     Past Medical History:  Diagnosis Date  . Anxiety   . Bipolar 1 disorder (HCC)   . Headache(784.0)   . Hypertension   . Substance abuse [facility](HCC)     Patient Active Problem List   Diagnosis Date Noted  . ADHD (attention deficit hyperactivity disorder), combined type 01/18/2015  . Major depressive disorder, recurrent, severe without psychotic behavior (HCC) 01/17/2015  . Polysubstance abuse (HCC) 05/02/2013  . MDD (major depressive disorder) 05/02/2013  . Suicide attempt (HCC) 05/02/2013  . Suicidal ideation 05/02/2013  . Impulse control disorder, unspecified 01/04/2013  . Unspecified episodic mood disorder 01/04/2013    History reviewed. No pertinent surgical history.      Home Medications    Prior to Admission medications   Medication Sig Start Date End Date Taking? Authorizing Provider   cloNIDine HCl (KAPVAY) 0.1 MG TB12 ER tablet Take 1 tablet (0.1 mg total) by mouth at bedtime. 01/23/15   Adonis BrookAgustin, Sheila, NP  FLUoxetine (PROZAC) 20 MG capsule Take 1 capsule (20 mg total) by mouth daily. 01/23/15   Adonis BrookAgustin, Sheila, NP  levothyroxine (SYNTHROID, LEVOTHROID) 50 MCG tablet Take 50 mcg by mouth daily before breakfast.    [provider]  OMEPRAZOLE PO Take by mouth.    [provider]  Ondansetron HCl (ZOFRAN PO) Take by mouth.    [provider]    Family History Family History  Problem Relation Age of Onset  . Cancer Mother        Lung    Social History Social History   Tobacco Use  . Smoking status: Current Every Day Smoker    Packs/day: 2.00    Types: Cigarettes  . Smokeless tobacco: Never Used  Substance Use Topics  . Alcohol use: Yes    Comment: 1 pint of Jim Beam  . Drug use: Yes    Types: Marijuana, "Crack" cocaine, Other-see comments, Methamphetamines    Comment: heroin-in [facility]     Allergies   Penicillins   Review of Systems Review of Systems  Constitutional: Negative for fever.  HENT: Negative for rhinorrhea and sore throat.   Eyes: Negative for redness.  Respiratory: Negative for cough.   Cardiovascular: Positive for chest pain and palpitations.  Gastrointestinal: Negative for abdominal pain, diarrhea, nausea and vomiting.  Genitourinary: Negative  for dysuria.  Musculoskeletal: Negative for myalgias.  Skin: Negative for rash.  Neurological: Negative for headaches.     Physical Exam Updated Vital Signs BP 127/83 (BP Location: Right Arm)   Pulse (!) 134   Temp 98.9 F (37.2 C) (Oral)   Resp (!) 26   SpO2 96%   Physical Exam Vitals signs and nursing note reviewed.  Constitutional:      Appearance: He is well-developed. He is not diaphoretic.  HENT:     Head: Normocephalic and atraumatic.     Mouth/Throat:     Mouth: Mucous membranes are not dry.  Eyes:     Conjunctiva/sclera: Conjunctivae normal.   Neck:     Musculoskeletal: Normal range of motion and neck supple. No muscular tenderness.     Vascular: Normal carotid pulses. No carotid bruit or JVD.     Trachea: Trachea normal. No tracheal deviation.  Cardiovascular:     Rate and Rhythm: Regular rhythm. Tachycardia present.     Pulses: No decreased pulses.     Heart sounds: Normal heart sounds, S1 normal and S2 normal. Heart sounds not distant. No murmur.  Pulmonary:     Effort: Pulmonary effort is normal. Tachypnea present. No accessory muscle usage or respiratory distress.     Breath sounds: Normal breath sounds. No wheezing.  Chest:     Chest wall: No tenderness.  Abdominal:     General: Bowel sounds are normal.     Palpations: Abdomen is soft.     Tenderness: There is no abdominal tenderness. There is no guarding or rebound.  Musculoskeletal:     Right lower leg: He exhibits no tenderness. No edema.     Left lower leg: He exhibits no tenderness. No edema.  Skin:    General: Skin is warm and dry.     Coloration: Skin is not pale.  Neurological:     Mental Status: He is alert.  Psychiatric:        Mood and Affect: Mood is anxious.        Thought Content: Thought content is delusional. Thought content does not include homicidal or suicidal ideation.     Comments: Pt states bounty hunters were after him causing him extreme anxiety. He states he has delusions when he uses ice.       ED Treatments / Results  Labs (all labs ordered are listed, but only abnormal results are displayed) Labs Reviewed  CBC - Abnormal; Notable for the following components:      Result Value   WBC 18.1 (*)    All other components within normal limits  COMPREHENSIVE METABOLIC PANEL - Abnormal; Notable for the following components:   CO2 19 (*)    Glucose, Bld 201 (*)    BUN 31 (*)    AST 150 (*)    ALT 45 (*)    Total Bilirubin 1.5 (*)    Anion gap 16 (*)    All other components within normal limits  TROPONIN I (HIGH SENSITIVITY) -  Abnormal; Notable for the following components:   Troponin I (High Sensitivity) 36 (*)    All other components within normal limits  TROPONIN I (HIGH SENSITIVITY)    EKG EKG Interpretation  Date/Time:  Friday March 16 2019 13:03:01 EDT Ventricular Rate:  119 PR Interval:    QRS Duration: 93 QT Interval:  314 QTC Calculation: 442 R Axis:   85 Text Interpretation:  Sinus tachycardia ST elev, probable normal early repol pattern Confirmed by Juleen ChinaKohut,  Annie Main [phone]) on 03/16/2019 1:43:35 PM   Radiology Dg Chest Portable 1 View  Result Date: 03/16/2019 CLINICAL DATA:  Pt states he used a significant amount of meth last night, more than he usually does. Has chest tightness and having trouble catching his breath. Pt is a smoker. No previous heart or lung issues or surgeries. EXAM: PORTABLE CHEST 1 VIEW COMPARISON:  02/26/2013 FINDINGS: Cardiac silhouette normal in size and configuration and contours. Normal mediastinal. Clear lungs.  No pleural effusion or pneumothorax. Skeletal structures grossly intact. IMPRESSION: No active disease. Electronically Signed   By: Lajean Manes M.D.   On: 03/16/2019 13:00    Procedures Procedures (including critical care time)  Medications Ordered in ED Medications  LORazepam (ATIVAN) injection 1 mg (has no administration in time range)  sodium chloride 0.9 % bolus 1,000 mL (0 mLs Intravenous Stopped 03/16/19 1432)  LORazepam (ATIVAN) injection 1 mg (1 mg Intravenous Given 03/16/19 1302)     Initial Impression / Assessment and Plan / ED Course  I have reviewed the triage vital signs and the nursing notes.  Pertinent labs & imaging results that were available during my care of the patient were reviewed by me and considered in my medical decision making (see chart for details).        Patient seen and examined. Work-up initiated. Medications ordered.   Vital signs reviewed and are as follows: BP 127/83 (BP Location: Right Arm)   Pulse (!) 134   Temp  98.9 F (37.2 C) (Oral)   Resp (!) 26   SpO2 96%   2:48 PM Still tachycardic. Will need repeat trop due now.   3:46 PM patient updated on results.  He continues to be very anxious and restless.  Additional Ativan ordered.  Patient states that he plans on going to behavioral health tomorrow so we can be referred for treatment.  He states that he needs to go home to do his laundry first.  Patient gives additional history.  He states that when he does ice he hallucinates.  He states that over the past 2 days he has seen bounty huntes come to get him.  He was with family today who would not let him stay at home alone.  He became extremely anxious and EMS was called.   I offered patient evaluation by behavioral health here to help assist in placement and treatment.  He declined my offer several times.  He agrees to be treated with additional Ativan and be given outpatient referrals, although he states he ready knows where he needs to go.  Patient does not appear to be a danger to himself or others.  He has insight into his current situation and the consequences of his drug abuse.  He seems motivated to follow-up for treatment.  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  He also seems to be having extreme anxiety, panic, and some delusions and psychosis.  Patient understands that the symptoms occur when he uses drugs.  He states that he is going to seek help tomorrow.  He declines any additional evaluation by TTS here.  He does not seem to be an acute danger to himself or others.  He has an appropriate plan to seek treatment.  Awaiting second troponin.  ED Discharge Orders    None       Carlisle Cater, [place] 03/16/19 1558  Raeford RazorKohut, Stephen, MD 03/17/19 [phone]

## 2019-03-16 NOTE — ED Notes (Signed)
ED Provider at bedside. 

## 2019-03-17 ENCOUNTER — Emergency Department (HOSPITAL_COMMUNITY)
Admission: EM | Admit: 2019-03-17 | Discharge: 2019-03-17 | Disposition: A | Discharge status: Other Acute Inpatient Hospital | Payer: Self-pay | Attending: Emergency Medicine | Admitting: Emergency Medicine

## 2019-03-17 ENCOUNTER — Other Ambulatory Visit: Payer: Self-pay

## 2019-03-17 ENCOUNTER — Observation Stay (HOSPITAL_COMMUNITY)
Admission: AD | Admit: 2019-03-17 | Discharge: 2019-03-19 | Disposition: A | Discharge status: Home / Self Care | Payer: No Typology Code available for payment source | Source: Intra-hospital | Attending: Psychiatry | Admitting: Psychiatry

## 2019-03-17 DIAGNOSIS — Z1159 Encounter for screening for other viral diseases: Secondary | ICD-10-CM | POA: Insufficient documentation

## 2019-03-17 DIAGNOSIS — F319 Bipolar disorder, unspecified: Secondary | ICD-10-CM | POA: Insufficient documentation

## 2019-03-17 DIAGNOSIS — F1910 Other psychoactive substance abuse, uncomplicated: Secondary | ICD-10-CM | POA: Insufficient documentation

## 2019-03-17 DIAGNOSIS — Z03818 Encounter for observation for suspected exposure to other biological agents ruled out: Secondary | ICD-10-CM | POA: Insufficient documentation

## 2019-03-17 DIAGNOSIS — I10 Essential (primary) hypertension: Secondary | ICD-10-CM | POA: Insufficient documentation

## 2019-03-17 DIAGNOSIS — Z008 Encounter for other general examination: Secondary | ICD-10-CM | POA: Insufficient documentation

## 2019-03-17 DIAGNOSIS — R45851 Suicidal ideations: Secondary | ICD-10-CM | POA: Insufficient documentation

## 2019-03-17 DIAGNOSIS — F419 Anxiety disorder, unspecified: Secondary | ICD-10-CM | POA: Insufficient documentation

## 2019-03-17 DIAGNOSIS — F17210 Nicotine dependence, cigarettes, uncomplicated: Secondary | ICD-10-CM | POA: Insufficient documentation

## 2019-03-17 DIAGNOSIS — F329 Major depressive disorder, single episode, unspecified: Secondary | ICD-10-CM | POA: Diagnosis present

## 2019-03-17 DIAGNOSIS — F314 Bipolar disorder, current episode depressed, severe, without psychotic features: Secondary | ICD-10-CM | POA: Insufficient documentation

## 2019-03-17 DIAGNOSIS — Z880 Allergy status to penicillin: Secondary | ICD-10-CM | POA: Insufficient documentation

## 2019-03-17 DIAGNOSIS — F1424 Cocaine dependence with cocaine-induced mood disorder: Principal | ICD-10-CM

## 2019-03-17 DIAGNOSIS — F1510 Other stimulant abuse, uncomplicated: Secondary | ICD-10-CM | POA: Insufficient documentation

## 2019-03-17 LAB — CBC WITH DIFFERENTIAL/PLATELET
Abs Immature Granulocytes: 0.02 10*3/uL (ref 0.00–0.07)
Basophils Absolute: 0.1 10*3/uL (ref 0.0–0.1)
Basophils Relative: 1 %
Eosinophils Absolute: 0 10*3/uL (ref 0.0–0.5)
Eosinophils Relative: 0 %
HCT: 43.4 % (ref 39.0–52.0)
Hemoglobin: 14.8 g/dL (ref 13.0–17.0)
Immature Granulocytes: 0 %
Lymphocytes Relative: 21 %
Lymphs Abs: 2.2 10*3/uL (ref 0.7–4.0)
MCH: 30 pg (ref 26.0–34.0)
MCHC: 34.1 g/dL (ref 30.0–36.0)
MCV: 87.9 fL (ref 80.0–100.0)
Monocytes Absolute: 1 10*3/uL (ref 0.1–1.0)
Monocytes Relative: 10 %
Neutro Abs: 7.5 10*3/uL (ref 1.7–7.7)
Neutrophils Relative %: 68 %
Platelets: 262 10*3/uL (ref 150–400)
RBC: 4.94 MIL/uL (ref 4.22–5.81)
RDW: 12.2 % (ref 11.5–15.5)
WBC: 10.9 10*3/uL — ABNORMAL HIGH (ref 4.0–10.5)
nRBC: 0 % (ref 0.0–0.2)

## 2019-03-17 LAB — RAPID URINE DRUG SCREEN, HOSP PERFORMED
Amphetamines: POSITIVE — AB
Barbiturates: NOT DETECTED
Benzodiazepines: POSITIVE — AB
Cocaine: POSITIVE — AB
Opiates: NOT DETECTED
Tetrahydrocannabinol: NOT DETECTED

## 2019-03-17 LAB — COMPREHENSIVE METABOLIC PANEL
ALT: 59 U/L — ABNORMAL HIGH (ref 0–44)
AST: 151 U/L — ABNORMAL HIGH (ref 15–41)
Albumin: 4.2 g/dL (ref 3.5–5.0)
Alkaline Phosphatase: 57 U/L (ref 38–126)
Anion gap: 9 (ref 5–15)
BUN: 30 mg/dL — ABNORMAL HIGH (ref 6–20)
CO2: 27 mmol/L (ref 22–32)
Calcium: 9.2 mg/dL (ref 8.9–10.3)
Chloride: 104 mmol/L (ref 98–111)
Creatinine, Ser: 0.92 mg/dL (ref 0.61–1.24)
GFR calc Af Amer: >60 mL/min (ref >60)
GFR calc non Af Amer: >60 mL/min (ref >60)
Glucose, Bld: 126 mg/dL — ABNORMAL HIGH (ref 70–99)
Potassium: 3.6 mmol/L (ref 3.5–5.1)
Sodium: 140 mmol/L (ref 135–145)
Total Bilirubin: 1.2 mg/dL (ref 0.3–1.2)
Total Protein: 7.9 g/dL (ref 6.5–8.1)

## 2019-03-17 LAB — URINALYSIS, ROUTINE W REFLEX MICROSCOPIC
Bilirubin Urine: NEGATIVE
Glucose, UA: NEGATIVE mg/dL
Hgb urine dipstick: NEGATIVE
Ketones, ur: NEGATIVE mg/dL
Leukocytes,Ua: NEGATIVE
Nitrite: NEGATIVE
Protein, ur: NEGATIVE mg/dL
Specific Gravity, Urine: 1.029 (ref 1.005–1.030)
pH: 5 (ref 5.0–8.0)

## 2019-03-17 LAB — ACETAMINOPHEN LEVEL: Acetaminophen (Tylenol), Serum: <10 ug/mL — ABNORMAL LOW (ref 10–30)

## 2019-03-17 LAB — SARS CORONAVIRUS 2 BY RT PCR (HOSPITAL ORDER, PERFORMED IN ~~LOC~~ HOSPITAL LAB): SARS Coronavirus 2: NEGATIVE

## 2019-03-17 LAB — ETHANOL: Alcohol, Ethyl (B): <10 mg/dL (ref <10)

## 2019-03-17 NOTE — ED Notes (Signed)
Pt leaving with [facility] transport to [facility], pt leaving with pt belongings.

## 2019-03-17 NOTE — ED Notes (Addendum)
Pt requesting referral to behavioral health for drug rehab. Pt states he was at [facility] yesterday for meth overdose. Pt stated he last took meth and cocaine last night/this morning. Pt c/o nausea

## 2019-03-17 NOTE — ED Provider Notes (Signed)
[facility] DEPT Provider Note   CSN: [id number] Arrival date & time: 03/17/19  1042     History   Chief Complaint Chief Complaint  Patient presents with  . Medical Clearance  . Nausea    HPI Derrick [name] is a 33 y.o. male.     HPI Patient presents to the emergency department with does not ideations after the death of his fiance.  The patient states he was recently in jail when he got out he started having worsening suicidal thoughts.  The patient states that he has no specific plan but is very depressed and has been having a lot of suicidal thoughts.  Patient denies any other symptoms.  The patient denies chest pain, shortness of breath, headache,blurred vision, neck pain, fever, cough, weakness, numbness, dizziness, anorexia, edema, abdominal pain, nausea, vomiting, diarrhea, rash, back pain, dysuria, hematemesis, bloody stool, near syncope, or syncope. Past Medical History:  Diagnosis Date  . Anxiety   . Bipolar 1 disorder ([place])   . Headache(784.0)   . Hypertension   . Substance abuse [facility])     Patient Active Problem List   Diagnosis Date Noted  . ADHD (attention deficit hyperactivity disorder), combined type 01/18/2015  . Major depressive disorder, recurrent, severe without psychotic behavior ([place]) 01/17/2015  . Polysubstance abuse ([place]) 05/02/2013  . MDD (major depressive disorder) 05/02/2013  . Suicide attempt ([place]) 05/02/2013  . Suicidal ideation 05/02/2013  . Impulse control disorder, unspecified 01/04/2013  . Unspecified episodic mood disorder 01/04/2013    No past surgical history on file.      Home Medications    Prior to Admission medications   Medication Sig Start Date End Date Taking? Authorizing Provider  cloNIDine HCl (KAPVAY) 0.1 MG TB12 ER tablet Take 1 tablet (0.1 mg total) by mouth at bedtime. Patient not taking: Reported on 03/17/2019 01/23/15   Kerrie Buffalo, NP  FLUoxetine (PROZAC) 20 MG capsule Take 1  capsule (20 mg total) by mouth daily. Patient not taking: Reported on 03/17/2019 01/23/15   Kerrie Buffalo, NP    Family History Family History  Problem Relation Age of Onset  . Cancer Mother        Lung    Social History Social History   Tobacco Use  . Smoking status: Current Every Day Smoker    Packs/day: 2.00    Types: Cigarettes  . Smokeless tobacco: Never Used  Substance Use Topics  . Alcohol use: Yes    Comment: 1 pint of Jim Beam  . Drug use: Yes    Types: Marijuana, "Crack" cocaine, Other-see comments, Methamphetamines    Comment: heroin-in [facility]     Allergies   Penicillins   Review of Systems Review of Systems All other systems negative except as documented in the HPI. All pertinent positives and negatives as reviewed in the HPI.  Physical Exam Updated Vital Signs BP 127/88   Pulse 82   Temp (!) 97.5 F (36.4 C) (Oral)   Resp 18   SpO2 100%   Physical Exam Vitals signs and nursing note reviewed.  Constitutional:      General: He is not in acute distress.    Appearance: He is well-developed.  HENT:     Head: Normocephalic and atraumatic.  Eyes:     Pupils: Pupils are equal, round, and reactive to light.  Neck:     Musculoskeletal: Normal range of motion and neck supple.  Cardiovascular:     Rate and Rhythm: Normal rate and regular  rhythm.     Heart sounds: Normal heart sounds. No murmur. No friction rub. No gallop.   Pulmonary:     Effort: Pulmonary effort is normal. No respiratory distress.     Breath sounds: Normal breath sounds. No wheezing.  Abdominal:     General: Bowel sounds are normal. There is no distension.     Palpations: Abdomen is soft.     Tenderness: There is no abdominal tenderness.  Skin:    General: Skin is warm and dry.     Capillary Refill: Capillary refill takes less than 2 seconds.     Findings: No erythema or rash.  Neurological:     Mental Status: He is alert and oriented to person, place, and time.     Motor: No  abnormal muscle tone.     Coordination: Coordination normal.  Psychiatric:        Behavior: Behavior normal.        Thought Content: Thought content includes suicidal ideation.      ED Treatments / Results  Labs (all labs ordered are listed, but only abnormal results are displayed) Labs Reviewed  COMPREHENSIVE METABOLIC PANEL - Abnormal; Notable for the following components:      Result Value   Glucose, Bld 126 (*)    BUN 30 (*)    AST 151 (*)    ALT 59 (*)    All other components within normal limits  CBC WITH DIFFERENTIAL/PLATELET - Abnormal; Notable for the following components:   WBC 10.9 (*)    All other components within normal limits  URINALYSIS, ROUTINE W REFLEX MICROSCOPIC - Abnormal; Notable for the following components:   APPearance HAZY (*)    All other components within normal limits  RAPID URINE DRUG SCREEN, HOSP PERFORMED - Abnormal; Notable for the following components:   Cocaine POSITIVE (*)    Benzodiazepines POSITIVE (*)    Amphetamines POSITIVE (*)    All other components within normal limits  ACETAMINOPHEN LEVEL - Abnormal; Notable for the following components:   Acetaminophen (Tylenol), Serum <10 (*)    All other components within normal limits  SARS CORONAVIRUS 2 (HOSPITAL ORDER, PERFORMED IN [facility])  ETHANOL    EKG None  Radiology Dg Chest Portable 1 View  Result Date: 03/16/2019 CLINICAL DATA:  Pt states he used a significant amount of meth last night, more than he usually does. Has chest tightness and having trouble catching his breath. Pt is a smoker. No previous heart or lung issues or surgeries. EXAM: PORTABLE CHEST 1 VIEW COMPARISON:  02/26/2013 FINDINGS: Cardiac silhouette normal in size and configuration and contours. Normal mediastinal. Clear lungs.  No pleural effusion or pneumothorax. Skeletal structures grossly intact. IMPRESSION: No active disease. Electronically Signed   By: Amie Portlandavid  Ormond M.D.   On: 03/16/2019 13:00     Procedures Procedures (including critical care time)  Medications Ordered in ED Medications - No data to display   Initial Impression / Assessment and Plan / ED Course  I have reviewed the triage vital signs and the nursing notes.  Pertinent labs & imaging results that were available during my care of the patient were reviewed by me and considered in my medical decision making (see chart for details).        Patient need TTS assessment for suicidal ideations.  Patient is medically stable thus far.  Final Clinical Impressions(s) / ED Diagnoses   Final diagnoses:  None    ED Discharge Orders  None       Charlestine NightLawyer, Leyli Kevorkian, PA-C 03/17/19 1701    Cathren LaineSteinl, Kevin, MD 03/18/19 [phone]

## 2019-03-17 NOTE — ED Notes (Signed)
Report given to Bonnie, RN at [facility] 

## 2019-03-17 NOTE — BH Assessment (Signed)
Assessment Note  Derrick [name] is an 33 y.o. male that presents this date with S/I. Patient voices a plan to "jump off a bridge." Patient denies any H/I or AVH. Patient reports he was released from jail three days ago and is currently under supervised probation. Patient states since his release he has overdosed on Methamphetamine twice presenting for the first time last night to [facility] for overdosing on Methamphetamine and was treated and released. Patient states he "just can't stay off drugs" and used again not long after he was discharged. Patient states he lacks social support and is depressed since he found out that his girlfriend overdosed and died while he was incarcerated. Patient states she is currently residing with his grandfather although that is temporary. Patient states after a night of using cocaine and methamphetamine he decided walking back to his grandfather's earlier, that he would "jump off a bridge he was walking by" because he has lost all hope. Patient reports two prior attempts at self harm and was last seen per chart review in 2017 when he presented with similar symptoms. Patient denies any OP treatment since 2015 when he states he was diagnosed with being Bipolar. Patient cannot recall the last time he was on any medications for symptom management. Per chart review patient was last seen in 2017 when he presented at that time requesting assistance for polysubstance use. Patient has a noted history at that time of Bipolar 1 disorder, anxiety, cocaine, alcohol and crystal methamphetamine abuse. Patient this date states his depression has worsened since he was discharged from jail with symptoms to include: feeling useless and guilt. Patient has a history of outpatient services from [facility] for depression and [facility] in 2015. Patient is oriented x 4 and speaks in a pressured voice. Patient renders conflicting history and appears to be disorganized at times. Patient does  not appear to be responding to any internal stimuli. Case was staffed with Bobby Rumpf NP who recommended patient be observed and monitored for safety. Psychiatry will evaluate in the a.m.       Diagnosis: F31.4 Bipolar 1, current or most recent episode depressed, severe, Polysubstance abuse    Past Medical History:  Past Medical History:  Diagnosis Date  . Anxiety   . Bipolar 1 disorder ([place])   . Headache(784.0)   . Hypertension   . Substance abuse ([place])     No past surgical history on file.  Family History:  Family History  Problem Relation Age of Onset  . Cancer Mother        Lung    Social History:  reports that he has been smoking cigarettes. He has been smoking about 2.00 packs per day. He has never used smokeless tobacco. He reports current alcohol use. He reports current drug use. Drugs: Marijuana, "Crack" cocaine, Other-see comments, and Methamphetamines.  Additional Social History:  Alcohol / Drug Use Pain Medications: See MAR Prescriptions: See MAR Over the Counter: See MAR History of alcohol / drug use?: Yes Substance #1 Name of Substance 1: Cocaine 1 - Age of First Use: 24 1 - Amount (size/oz): Varies 1 - Frequency: Varies 1 - Duration: Ongoing 1 - Last Use / Amount: 03/16/19 1 gram Substance #2 Name of Substance 2: Methamphetamine  CIWA: CIWA-Ar BP: 120/84 Pulse Rate: 86 COWS:    Allergies:  Allergies  Allergen Reactions  . Penicillins Nausea Only    Did it involve swelling of the face/tongue/throat, SOB, or low BP? unknown Did it  involve sudden or severe rash/hives, skin peeling, or any reaction on the inside of your mouth or nose? yes Did you need to seek medical attention at a hospital or doctor's office? yes When did it last happen?03/16/2019 If all above answers are "NO", may proceed with cephalosporin use.     Home Medications: (Not in a hospital admission)   OB/GYN Status:  No LMP for male patient.  General Assessment Data [facility] Assessment: In system Is this a Tele or Face-to-Face Assessment?: Face-to-Face Is this an Initial Assessment or a Re-assessment for this encounter?: Initial Assessment Patient Accompanied by:: N/A Language Other than English: No Living Arrangements: Other (Comment) What gender do you identify as?: Male Marital status: Single Living Arrangements: Other relatives Can pt return to current living arrangement?: Yes Admission Status: Voluntary Is patient capable of signing voluntary admission?: Yes Referral Source: Self/Family/Friend Insurance type: Self pay  Medical Screening Exam [facility] Walk-in ONLY) Medical Exam completed: Yes  Crisis Care Plan Living Arrangements: Other relatives Legal Guardian: (NA) Name of Psychiatrist: None Name of Therapist: None  Education Status Is patient currently in school?: No Is the patient employed, unemployed or receiving disability?: Unemployed  Risk to self with the past 6 months Suicidal Ideation: Yes-Currently Present Has patient been a risk to self within the past 6 months prior to admission? : No Suicidal Intent: Yes-Currently Present Has patient had any suicidal intent within the past 6 months prior to admission? : No Is patient at risk for suicide?: Yes Suicidal Plan?: Yes-Currently Present Has patient had any suicidal plan within the past 6 months prior to admission? : No Specify Current Suicidal Plan: Jump off bridge Access to Means: No What has been your use of drugs/alcohol within the last 12 months?: Current use Previous Attempts/Gestures: No How many times?: 0 Other Self Harm Risks: (Excessive SA use) Triggers for Past Attempts: (NA) Intentional Self Injurious Behavior: None Family Suicide History: No Recent stressful life event(s): Other (Comment)(Just released from jail) Persecutory voices/beliefs?: No Depression: Yes Depression Symptoms: Isolating, Feeling worthless/self  pity Substance abuse history and/or treatment for substance abuse?: No Suicide prevention information given to non-admitted patients: Not applicable  Risk to Others within the past 6 months Homicidal Ideation: No Does patient have any lifetime risk of violence toward others beyond the six months prior to admission? : No Thoughts of Harm to Others: No Current Homicidal Intent: No Current Homicidal Plan: No Access to Homicidal Means: No Identified Victim: NA History of harm to others?: No Assessment of Violence: None Noted Violent Behavior Description: NA Does patient have access to weapons?: No Criminal Charges Pending?: No Does patient have a court date: No Is patient on probation?: Yes  Psychosis Hallucinations: None noted Delusions: None noted  Mental Status Report Appearance/Hygiene: Unremarkable Eye Contact: Fair Motor Activity: Freedom of movement Speech: Pressured Level of Consciousness: Irritable Mood: Anxious Affect: Preoccupied Anxiety Level: Moderate Thought Processes: Flight of Ideas Judgement: Impaired Orientation: Person, Place, Time Obsessive Compulsive Thoughts/Behaviors: None  Cognitive Functioning Concentration: Decreased Memory: Recent Intact, Remote Intact Is patient IDD: No Insight: Fair Impulse Control: Poor Appetite: Fair Have you had any weight changes? : No Change Sleep: No Change Total Hours of Sleep: 6 Vegetative Symptoms: None  ADLScreening [facility] Assessment Services) Patient's cognitive ability adequate to safely complete daily activities?: Yes Patient able to express need for assistance with ADLs?: Yes Independently performs ADLs?: Yes (appropriate for developmental age)  Prior Inpatient Therapy Prior Inpatient Therapy: No  Prior Outpatient Therapy Prior Outpatient Therapy: No Does patient have an ACCT team?: No Does patient have Intensive In-House Services?  : No Does patient have [facility] services? : No Does patient have P4CC  services?: No  ADL Screening (condition at time of admission) Patient's cognitive ability adequate to safely complete daily activities?: Yes Is the patient deaf or have difficulty hearing?: No Does the patient have difficulty seeing, even when wearing glasses/contacts?: No Does the patient have difficulty concentrating, remembering, or making decisions?: No Patient able to express need for assistance with ADLs?: Yes Does the patient have difficulty dressing or bathing?: No Independently performs ADLs?: Yes (appropriate for developmental age) Does the patient have difficulty walking or climbing stairs?: No Weakness of Legs: None Weakness of Arms/Hands: None  Home Assistive Devices/Equipment Home Assistive Devices/Equipment: None  Therapy Consults (therapy consults require a physician order) PT Evaluation Needed: No OT Evalulation Needed: No SLP Evaluation Needed: No Abuse/Neglect Assessment (Assessment to be complete while patient is alone) Physical Abuse: Denies Verbal Abuse: Denies Sexual Abuse: Denies Exploitation of patient/patient's resources: Denies Self-Neglect: Denies Values / Beliefs Cultural Requests During Hospitalization: None Spiritual Requests During Hospitalization: None Consults Spiritual Care Consult Needed: No Social Work Consult Needed: No Merchant navy officerAdvance Directives (For Healthcare) Does Patient Have a Medical Advance Directive?: No Would patient like information on creating a medical advance directive?: No - Patient declined          Disposition: Case was staffed with Melvyn NethLewis NP who recommended patient be observed and monitored for safety. Psychiatry will evaluate in the a.m.       Disposition Initial Assessment Completed for this Encounter: Yes Disposition of Patient: (Observe and monitor) Patient refused recommended treatment: No Mode of transportation if patient is discharged/movement?: (Unk)  On Site Evaluation by:   Reviewed with Physician:    Alfredia Fergusonavid  L Dorse Locy 03/17/2019 3:41 PM

## 2019-03-17 NOTE — BH Assessment (Signed)
[place] Assessment Progress Note  Case was staffed with Bobby Rumpf NP who recommended patient be observed and monitored for safety. Psychiatry will evaluate in the a.m.

## 2019-03-17 NOTE — ED Notes (Signed)
TTS consult bedside.  

## 2019-03-17 NOTE — ED Notes (Signed)
Pt belonging bag in patient belonging cabinet 23-25. 1 pair of jeans, 1 pair of shorts, 1 shirt, patient ID. Bag has pt sticker on it

## 2019-03-17 NOTE — ED Notes (Signed)
ED Provider at bedside. 

## 2019-03-18 ENCOUNTER — Ambulatory Visit (HOSPITAL_COMMUNITY): Payer: Self-pay

## 2019-03-18 ENCOUNTER — Encounter (HOSPITAL_COMMUNITY): Payer: Self-pay

## 2019-03-18 DIAGNOSIS — F1424 Cocaine dependence with cocaine-induced mood disorder: Secondary | ICD-10-CM

## 2019-03-18 MED ORDER — TRAZODONE HCL 50 MG PO TABS: 50.0000 mg | ORAL_TABLET | Freq: Every evening | ORAL | Status: DC | PRN

## 2019-03-18 MED ORDER — HYDROXYZINE HCL 25 MG PO TABS: 25.0000 mg | ORAL_TABLET | Freq: Three times a day (TID) | ORAL | Status: DC | PRN

## 2019-03-18 MED ORDER — ACETAMINOPHEN 325 MG PO TABS
650.0000 mg | ORAL_TABLET | Freq: Four times a day (QID) | ORAL | Status: DC | PRN
  Administered 2019-03-18: 650 mg via ORAL
  Filled 2019-03-18: qty 2

## 2019-03-18 MED ORDER — GABAPENTIN 400 MG PO CAPS
400.0000 mg | ORAL_CAPSULE | Freq: Three times a day (TID) | ORAL | Status: DC
  Administered 2019-03-18 – 2019-03-19 (×4): 400 mg via ORAL
  Filled 2019-03-18 (×4): qty 1

## 2019-03-18 MED ORDER — GABAPENTIN 400 MG PO CAPS: 400.0000 mg | ORAL_CAPSULE | Freq: Three times a day (TID) | ORAL | Status: DC

## 2019-03-18 MED ORDER — LOPERAMIDE HCL 2 MG PO CAPS: 2.0000 mg | ORAL_CAPSULE | ORAL | Status: DC | PRN

## 2019-03-18 MED ORDER — THIAMINE HCL 100 MG/ML IJ SOLN: 100.0000 mg | Freq: Once | INTRAMUSCULAR | Status: DC

## 2019-03-18 MED ORDER — MAGNESIUM HYDROXIDE 400 MG/5ML PO SUSP: 30.0000 mL | Freq: Every day | ORAL | Status: DC | PRN

## 2019-03-18 MED ORDER — ALUM & MAG HYDROXIDE-SIMETH 200-200-20 MG/5ML PO SUSP: 30.0000 mL | ORAL | Status: DC | PRN

## 2019-03-18 MED ORDER — LORAZEPAM 1 MG PO TABS: 1.0000 mg | ORAL_TABLET | Freq: Four times a day (QID) | ORAL | Status: DC | PRN

## 2019-03-18 MED ORDER — VITAMIN B-1 100 MG PO TABS
100.0000 mg | ORAL_TABLET | Freq: Every day | ORAL | Status: DC
  Administered 2019-03-18 – 2019-03-19 (×2): 100 mg via ORAL
  Filled 2019-03-18 (×2): qty 1

## 2019-03-18 MED ORDER — ONDANSETRON 4 MG PO TBDP: 4.0000 mg | ORAL_TABLET | Freq: Four times a day (QID) | ORAL | Status: DC | PRN

## 2019-03-18 MED ORDER — ADULT MULTIVITAMIN W/MINERALS CH
1.0000 | ORAL_TABLET | Freq: Every day | ORAL | Status: DC
  Administered 2019-03-18 – 2019-03-19 (×2): 1 via ORAL
  Filled 2019-03-18 (×2): qty 1

## 2019-03-18 NOTE — Plan of Care (Signed)
[place] Observation Crisis Plan  Reason for Crisis Plan:  Crisis Stabilization   Plan of Care:  Referral for Substance Abuse  Family Support:      Current Living Environment:  Living Arrangements: Parent  Insurance:   Hospital Account    Name Acct ID Class Status Primary Coverage   Derrick [name], Derrick [name] [id number] [place] Open None        Guarantor Account (for Hospital Account [id number])    Name Relation to Pt Service Area Active? Acct Type   [name], Derrick   Address Phone       [address] [facility] [place], [place] [address] [phone](H)          Coverage Information (for Hospital Account [id number])    Not on file      Legal Guardian:     Primary Care Provider:  Patient, No Pcp Per  Current Outpatient Providers:  none  Psychiatrist:     Counselor/Therapist:     Compliant with Medications:  Yes  Additional Information:   Derrick [name] 7/26/20201:28 AM

## 2019-03-18 NOTE — Patient Outreach (Signed)
CPSS met with the patient in the [facility] observation unit in order to provide substance use recovery support and help with getting connected to substance use recovery resources. Patient is currently being recommended to be observed and monitored overnight for safety. Patient is interested in getting connected to residential substance use treatment. Most residential substance use treatment centers that work with individuals who are uninsured have long wait lists at this time due to social distancing rules in place. CPSS called [facility] and they do not have any male treatment beds avoidable today. [facility] does not do admissions on the weekends and they currently have a long wait list for their substance use treatment program. CPSS also talked to the patient about [facility], a long-term substance use treatment program in [place], but they do not do admissions on Sundays and possibly have a wait list for their residential treatment program as well. CPSS also provided information for several different substance use recovery resources. Some of these resources include residential/outpatient substance use treatment center list, NA meeting list, and CPSS contact information. CPSS strongly encouraged the patient to continue to stay in contact with CPSS for further substance use recovery support and help with getting connected to substance use treatment resources. CPSS will follow up with the patient tomorrow 03/19/19 in the [facility] observation unit in order provide further substance use recovery support and help with getting connected to substance use treatment resources.

## 2019-03-18 NOTE — Progress Notes (Signed)
Derrick [name] is a 33 y.o. male Voluntary admitted for suicide attempt. Pt stated he has a lot going on in his life, just came from jail 3 days ago and and found out that his girlfriend overdosed on heroine. Pt stated he attempted to kill himself by overdosing on methamphetamine, pt stated he has problem with drugs of which he does not know what to do. Pt complained of pain in his mouth due to  Too much drug use, and pain in the legs from walking too much. Pt's calm and cooperative at this time, denies SI/HI and contracted for safety. Consents signed, skin/belongings search completed and pt oriented to unit. Pt stable at this time. Pt given the opportunity to express concerns and ask questions. Pt given toiletries. Will continue to monitor.

## 2019-03-18 NOTE — Progress Notes (Signed)
D:Pt is requesting help with drug use. He talked about losing his fiance and has a 5 month old child. A:Offered support, encouragement and 15 minute checks. R:Resources are being gathered for substance abuse treatment. Safety maintained on the unit.

## 2019-03-18 NOTE — H&P (Addendum)
BH Observation Unit Provider Admission PAA/H&P  Patient Identification: Derrick [name] MRN:  [id number] Date of Evaluation:  03/18/2019 Chief Complaint:  Bipolar I Disorder Principal Diagnosis: Cocaine dependence with cocaine-induced mood disorder (HCC) Diagnosis:  Principal Problem:   Cocaine dependence with cocaine-induced mood disorder (HCC) Active Problems:   MDD (major depressive disorder)  History of Present Illness: Patient was evaluated by attending psychiatrist and nurse practitioner.  He is awake alert and oriented x3.  Reporting a history of substance abuse use however more recently reports a loss of her fianc that passed away from overdose.  He is seeking resources for therapy.  States history of substance abuse use with " months" of sobriety however reports a recent relapse..  Support to follow-up for additional outpatient resources.  Consider discharge disposition for 03/19/2019.  Per assessment note: Derrick [name] is an 33 y.o. male that presents this date with S/I. Patient voices a plan to "jump off a bridge." Patient denies any H/I or AVH. Patient reports he was released from jail three days ago and is currently under supervised probation. Patient states since his release he has overdosed on Methamphetamine twice presenting for the first time last night to [facility] for overdosing on Methamphetamine and was treated and released. Patient states he "just can't stay off drugs" and used again not long after he was discharged. Patient states he lacks social support and is depressed since he found out that his girlfriend overdosed and died while he was incarcerated. Patient states she is currently residing with his grandfather although that is temporary. Patient states after a night of using cocaine and methamphetamine he decided walking back to his grandfather's earlier, that he would "jump off a bridge he was walking by" because he has lost all hope. Patient reports two prior attempts at  self harm and was last seen per chart review in 2017 when he presented with similar symptoms. Patient denies any OP treatment since 2015 when he states he was diagnosed with being Bipolar. Patient cannot recall the last time he was on any medications for symptom management. Per chart review patient was last seen in 2017 when he presented at that time requesting assistance for polysubstance use. Patient has a noted history at that time of Bipolar 1 disorder, anxiety, cocaine, alcohol and crystal methamphetamine abuse. Patient this date states his depression has worsened since he was discharged from jail with symptoms to include: feeling useless and guilt. Patient has a history of outpatient services from [facility] for [facility] and [facility] in 2015. Patient is oriented x 4 and speaks in a pressured voice. Patient renders conflicting history and appears to be disorganized at times. Patient does not appear to be responding to any internal stimuli.  Associated Signs/Symptoms: Depression Symptoms:  depressed mood, suicidal thoughts without plan, anxiety, panic attacks, (Hypo) Manic Symptoms:  Distractibility, Irritable Mood, Labiality of Mood, Anxiety Symptoms:  Excessive Worry, Social Anxiety, Psychotic Symptoms:  Hallucinations: None PTSD Symptoms: NA Total Time spent with patient: 15 minutes  Past Psychiatric History:   Is the patient at risk to self? No.  Has the patient been a risk to self in the past 6 months? No.  Has the patient been a risk to self within the distant past? No.  Is the patient a risk to others? No.  Has the patient been a risk to others in the past 6 months? Yes.    Has the patient been a risk to others within the distant past?  Yes.     Prior Inpatient Therapy:   Prior Outpatient Therapy:    Alcohol Screening: 1. How often do you have a drink containing alcohol?: 2 to 3 times a week 2. How many drinks containing alcohol do you have on a typical  day when you are drinking?: 3 or 4 3. How often do you have six or more drinks on one occasion?: Less than monthly AUDIT-C Score: 5 4. How often during the last year have you found that you were not able to stop drinking once you had started?: Less than monthly 5. How often during the last year have you failed to do what was normally expected from you becasue of drinking?: Less than monthly 6. How often during the last year have you needed a first drink in the morning to get yourself going after a heavy drinking session?: Less than monthly 7. How often during the last year have you had a feeling of guilt of remorse after drinking?: Never 8. How often during the last year have you been unable to remember what happened the night before because you had been drinking?: Never 9. Have you or someone else been injured as a result of your drinking?: Yes, but not in the last year 10. Has a relative or friend or a doctor or another health worker been concerned about your drinking or suggested you cut down?: Yes, but not in the last year Alcohol Use Disorder Identification Test Final Score (AUDIT): 12 Alcohol Brief Interventions/Follow-up: Alcohol Education Substance Abuse History in the last 12 months:  Yes.   Consequences of Substance Abuse: NA Previous Psychotropic Medications: No  Psychological Evaluations: No  Past Medical History:  Past Medical History:  Diagnosis Date  . Anxiety   . Bipolar 1 disorder (HCC)   . Headache(784.0)   . Hypertension   . Substance abuse (HCC)    History reviewed. No pertinent surgical history. Family History:  Family History  Problem Relation Age of Onset  . Cancer Mother        Lung   Family Psychiatric History:  Tobacco Screening:   Social History:  Social History   Substance and Sexual Activity  Alcohol Use Yes   Comment: 1 pint of [employer]     Social History   Substance and Sexual Activity  Drug Use Yes  . Types: Marijuana, "Crack" cocaine,  Other-see comments, Methamphetamines   Comment: heroin-in [place]    Additional Social History:      Pain Medications: See MAR Prescriptions: See MAR Over the Counter: See MAR History of alcohol / drug use?: Yes Negative Consequences of Use: Legal, Personal relationships, Financial Withdrawal Symptoms: Weakness, Tremors                    Allergies:   Allergies  Allergen Reactions  . Penicillins Nausea Only    Did it involve swelling of the face/tongue/throat, SOB, or low BP? unknown Did it involve sudden or severe rash/hives, skin peeling, or any reaction on the inside of your mouth or nose? yes Did you need to seek medical attention at a hospital or doctor's office? yes When did it last happen?03/16/2019 If all above answers are "NO", may proceed with cephalosporin use.    Lab Results:  Results for orders placed or performed during the hospital encounter of 03/17/19 (from the past 48 hour(s))  Comprehensive metabolic panel     Status: Abnormal   Collection Time: 03/17/19 11:47 AM  Result Value Ref Range  Sodium 140 135 - 145 mmol/L   Potassium 3.6 3.5 - 5.1 mmol/L   Chloride 104 98 - 111 mmol/L   CO2 27 22 - 32 mmol/L   Glucose, Bld 126 (H) 70 - 99 mg/dL   BUN 30 (H) 6 - 20 mg/dL   Creatinine, Ser 6.960.92 0.61 - 1.24 mg/dL   Calcium 9.2 8.9 - 29.510.3 mg/dL   Total Protein 7.9 6.5 - 8.1 g/dL   Albumin 4.2 3.5 - 5.0 g/dL   AST 284151 (H) 15 - 41 U/L   ALT 59 (H) 0 - 44 U/L   Alkaline Phosphatase 57 38 - 126 U/L   Total Bilirubin 1.2 0.3 - 1.2 mg/dL   GFR calc non Af Amer >60 >60 mL/min   GFR calc Af Amer >60 >60 mL/min   Anion gap 9 5 - 15    Comment: Performed at [facility], [address] [place], [place] [address]  CBC with Differential     Status: Abnormal   Collection Time: 03/17/19 11:47 AM  Result Value Ref Range   WBC 10.9 (H) 4.0 - 10.5 K/uL   RBC 4.94 4.22 - 5.81 MIL/uL   Hemoglobin 14.8 13.0 - 17.0 g/dL   HCT 01.043.4 27.239.0 -  53.652.0 %   MCV 87.9 80.0 - 100.0 fL   MCH 30.0 26.0 - 34.0 pg   MCHC 34.1 30.0 - 36.0 g/dL   RDW 64.412.2 03.411.5 - 74.215.5 %   Platelets 262 150 - 400 K/uL   nRBC 0.0 0.0 - 0.2 %   Neutrophils Relative % 68 %   Neutro Abs 7.5 1.7 - 7.7 K/uL   Lymphocytes Relative 21 %   Lymphs Abs 2.2 0.7 - 4.0 K/uL   Monocytes Relative 10 %   Monocytes Absolute 1.0 0.1 - 1.0 K/uL   Eosinophils Relative 0 %   Eosinophils Absolute 0.0 0.0 - 0.5 K/uL   Basophils Relative 1 %   Basophils Absolute 0.1 0.0 - 0.1 K/uL   Immature Granulocytes 0 %   Abs Immature Granulocytes 0.02 0.00 - 0.07 K/uL    Comment: Performed at [facility], [address] [place], [place] [address]  Urinalysis, Routine w reflex microscopic     Status: Abnormal   Collection Time: 03/17/19 11:47 AM  Result Value Ref Range   Color, Urine YELLOW YELLOW   APPearance HAZY (A) CLEAR   Specific Gravity, Urine 1.029 1.005 - 1.030   pH 5.0 5.0 - 8.0   Glucose, UA NEGATIVE NEGATIVE mg/dL   Hgb urine dipstick NEGATIVE NEGATIVE   Bilirubin Urine NEGATIVE NEGATIVE   Ketones, ur NEGATIVE NEGATIVE mg/dL   Protein, ur NEGATIVE NEGATIVE mg/dL   Nitrite NEGATIVE NEGATIVE   Leukocytes,Ua NEGATIVE NEGATIVE    Comment: Performed at [facility], [address] [place], [place] [address]  Urine rapid drug screen (hosp performed)     Status: Abnormal   Collection Time: 03/17/19 11:47 AM  Result Value Ref Range   Opiates NONE DETECTED NONE DETECTED   Cocaine POSITIVE (A) NONE DETECTED   Benzodiazepines POSITIVE (A) NONE DETECTED   Amphetamines POSITIVE (A) NONE DETECTED   Tetrahydrocannabinol NONE DETECTED NONE DETECTED   Barbiturates NONE DETECTED NONE DETECTED    Comment: (NOTE) DRUG SCREEN FOR MEDICAL PURPOSES ONLY.  IF CONFIRMATION IS NEEDED FOR ANY PURPOSE, NOTIFY LAB WITHIN 5 DAYS. LOWEST DETECTABLE LIMITS FOR URINE DRUG SCREEN Drug Class  Cutoff (ng/mL) Amphetamine and metabolites     1000 Barbiturate and metabolites    200 Benzodiazepine                 200 Tricyclics and metabolites     300 Opiates and metabolites        300 Cocaine and metabolites        300 THC                            50 Performed at [facility], [address] [place], [place] [address]   Acetaminophen level     Status: Abnormal   Collection Time: 03/17/19 11:47 AM  Result Value Ref Range   Acetaminophen (Tylenol), Serum <10 (L) 10 - 30 ug/mL    Comment: (NOTE) Therapeutic concentrations vary significantly. A range of 10-30 ug/mL  may be an effective concentration for many patients. However, some  are best treated at concentrations outside of this range. Acetaminophen concentrations >150 ug/mL at 4 hours after ingestion  and >50 ug/mL at 12 hours after ingestion are often associated with  toxic reactions. Performed at [facility], [address] [place], [place] [address]   Ethanol     Status: None   Collection Time: 03/17/19 11:47 AM  Result Value Ref Range   Alcohol, Ethyl (B) <10 <10 mg/dL    Comment: (NOTE) Lowest detectable limit for serum alcohol is 10 mg/dL. For medical purposes only. Performed at [facility], [address] [place], [place] [address]   SARS Coronavirus 2 (CEPHEID - Performed in [facility] hospital lab), Hosp Order     Status: None   Collection Time: 03/17/19  3:29 PM   Specimen: Nasopharyngeal Swab  Result Value Ref Range   SARS Coronavirus 2 NEGATIVE NEGATIVE    Comment: (NOTE) If result is NEGATIVE SARS-CoV-2 target nucleic acids are NOT DETECTED. The SARS-CoV-2 RNA is generally detectable in upper and lower  respiratory specimens during the acute phase of infection. The lowest  concentration of SARS-CoV-2 viral copies this assay can detect is 250  copies / mL. A negative result does not preclude SARS-CoV-2 infection  and should not be used as the sole basis for treatment or other   patient management decisions.  A negative result may occur with  improper specimen collection / handling, submission of specimen other  than nasopharyngeal swab, presence of viral mutation(s) within the  areas targeted by this assay, and inadequate number of viral copies  (<250 copies / mL). A negative result must be combined with clinical  observations, patient history, and epidemiological information. If result is POSITIVE SARS-CoV-2 target nucleic acids are DETECTED. The SARS-CoV-2 RNA is generally detectable in upper and lower  respiratory specimens dur ing the acute phase of infection.  Positive  results are indicative of active infection with SARS-CoV-2.  Clinical  correlation with patient history and other diagnostic information is  necessary to determine patient infection status.  Positive results do  not rule out bacterial infection or co-infection with other viruses. If result is PRESUMPTIVE POSTIVE SARS-CoV-2 nucleic acids MAY BE PRESENT.   A presumptive positive result was obtained on the submitted specimen  and confirmed on repeat testing.  While 2019 novel coronavirus  (SARS-CoV-2) nucleic acids may be present in the submitted sample  additional confirmatory testing may be necessary for epidemiological  and / or clinical management purposes  to differentiate between  SARS-CoV-2  and other Sarbecovirus currently known to infect humans.  If clinically indicated additional testing with an alternate test  methodology [phone]) is advised. The SARS-CoV-2 RNA is generally  detectable in upper and lower respiratory sp ecimens during the acute  phase of infection. The expected result is Negative. Fact Sheet for Patients:  BoilerBrush.com.cy Fact Sheet for Healthcare Providers: https://pope.com/ This test is not yet approved or cleared by the [place] FDA and has been authorized for detection and/or diagnosis of SARS-CoV-2  by FDA under an Emergency Use Authorization (EUA).  This EUA will remain in effect (meaning this test can be used) for the duration of the COVID-19 declaration under Section 564(b)(1) of the Act, 21 U.S.C. section 360bbb-3(b)(1), unless the authorization is terminated or revoked sooner. Performed at [facility], [address] [place], [place] [address]     Blood Alcohol level:  Lab Results  Component Value Date   [facility] <10 03/17/2019   ETH <5 02/08/2016    Metabolic Disorder Labs:  Lab Results  Component Value Date   HGBA1C 5.7 05/15/2015   No results found for: PROLACTIN Lab Results  Component Value Date   CHOL 222 (A) 05/15/2015   TRIG 295 (A) 05/15/2015   HDL 39 05/15/2015   LDLCALC 124 05/15/2015    Current Medications: Current Facility-Administered Medications  Medication Dose Route Frequency Provider Last Rate Last Dose  . acetaminophen (TYLENOL) tablet 650 mg  650 mg Oral Q6H PRN Oneta Rack, NP      . alum & mag hydroxide-simeth (MAALOX/MYLANTA) 200-200-20 MG/5ML suspension 30 mL  30 mL Oral Q4H PRN Oneta Rack, NP      . gabapentin (NEURONTIN) capsule 400 mg  400 mg Oral TID Thedore Mins, MD   400 mg at 03/18/19 1034  . hydrOXYzine (ATARAX/VISTARIL) tablet 25 mg  25 mg Oral TID PRN Oneta Rack, NP      . loperamide (IMODIUM) capsule 2-4 mg  2-4 mg Oral PRN Oneta Rack, NP      . LORazepam (ATIVAN) tablet 1 mg  1 mg Oral Q6H PRN Oneta Rack, NP      . magnesium hydroxide (MILK OF MAGNESIA) suspension 30 mL  30 mL Oral Daily PRN Oneta Rack, NP      . multivitamin with minerals tablet 1 tablet  1 tablet Oral Daily Oneta Rack, NP   1 tablet at 03/18/19 1032  . ondansetron (ZOFRAN-ODT) disintegrating tablet 4 mg  4 mg Oral Q6H PRN Oneta Rack, NP      . thiamine (B-1) injection 100 mg  100 mg Intramuscular Once Oneta Rack, NP      . thiamine (VITAMIN B-1) tablet 100 mg  100 mg Oral Daily Oneta Rack,  NP   100 mg at 03/18/19 1032  . traZODone (DESYREL) tablet 50 mg  50 mg Oral QHS PRN Oneta Rack, NP       PTA Medications: Medications Prior to Admission  Medication Sig Dispense Refill Last Dose  . cloNIDine HCl (KAPVAY) 0.1 MG TB12 ER tablet Take 1 tablet (0.1 mg total) by mouth at bedtime. (Patient not taking: Reported on 03/17/2019) 30 tablet 0   . FLUoxetine (PROZAC) 20 MG capsule Take 1 capsule (20 mg total) by mouth daily. (Patient not taking: Reported on 03/17/2019) 30 capsule 0     Musculoskeletal: Strength & Muscle Tone: within normal limits Gait & Station: normal Patient leans: N/A  Psychiatric Specialty Exam: Physical  Exam  Vitals reviewed. Constitutional: He appears well-developed.  Psychiatric: He has a normal mood and affect. His behavior is normal.    Review of Systems  Psychiatric/Behavioral: Positive for depression, substance abuse and suicidal ideas. The patient is nervous/anxious.   All other systems reviewed and are negative.   Blood pressure 128/90, pulse 82, temperature 98.4 F (36.9 C), temperature source Oral, resp. rate 18, SpO2 97 %.There is no height or weight on file to calculate BMI.  General Appearance: Casual  Eye Contact:  Good  Speech:  Clear and Coherent  Volume:  Normal  Mood:  Anxious and Depressed  Affect:  Congruent  Thought Process:  Coherent  Orientation:  Full (Time, Place, and Person)  Thought Content:  Logical  Suicidal Thoughts:  Yes.  with intent/plan  Homicidal Thoughts:  No  Memory:  Immediate;   Fair Recent;   Fair Remote;   Fair  Judgement:  Fair  Insight:  Fair  Psychomotor Activity:  Normal  Concentration:  Concentration: Fair  Recall:  [employer] of Knowledge:  Fair  Language:  Fair  Akathisia:  No  Handed:  Right  AIMS (if indicated):     Assets:  Communication Skills Desire for Improvement Physical Health Resilience Social Support  ADL's:  Intact  Cognition:  WNL  Sleep:       Peer support to  follow-up for additional patient resources. Patient is seeking long-term inpatient admission for rehabilitation and therapy  Observation Level/Precautions:  15 minute checks Laboratory:  CBC Chemistry Profile UDS UA Psychotherapy:  Individual and group session Medications:   Consultations:  CSW and Peer Support  Discharge Concerns:  Safety, stabilization, and risk of access to medication and medication stabilization  Estimated LOS: less than 24 hours  Other:      Derrill Center, NP 7/26/202011:39 AM  Patient seen face-to-face for psychiatric evaluation, chart reviewed and case discussed with the physician extender and developed treatment plan. Reviewed the information documented and agree with the treatment plan. Corena Pilgrim, MD

## 2019-03-19 ENCOUNTER — Encounter (HOSPITAL_COMMUNITY): Payer: Self-pay | Admitting: Psychiatric/Mental Health

## 2019-03-19 DIAGNOSIS — F1424 Cocaine dependence with cocaine-induced mood disorder: Secondary | ICD-10-CM

## 2019-03-19 MED ORDER — THIAMINE HCL 100 MG PO TABS: 100.0000 mg | ORAL_TABLET | Freq: Every day | ORAL | 0 refills | Status: DC

## 2019-03-19 MED ORDER — ADULT MULTIVITAMIN W/MINERALS CH: 1.0000 | ORAL_TABLET | Freq: Every day | ORAL | 0 refills | Status: DC

## 2019-03-19 MED ORDER — NICOTINE POLACRILEX 2 MG MT GUM: 2.0000 mg | CHEWING_GUM | OROMUCOSAL | Status: DC | PRN

## 2019-03-19 NOTE — Progress Notes (Signed)
Pt observed in the milieu walking back and forth. Pt stated he constantly have racing thoughts except while he is a sleep, this has made him to sleep most of the time. Pt stated he is glad here is because he has been able to talk to people. Pt is looking forward having an impatient follow up to help him with his addiction. Pt's safety ensured with 15 minute and environmental checks. Pt currently denies SI/HI and A/V hallucinations. Pt verbally agrees to seek staff if SI/HI or A/VH occurs and to consult with staff before acting on these thoughts. Will continue POC.

## 2019-03-19 NOTE — Progress Notes (Signed)
Patient ID: Derrick [name], male   DOB: November 23, 1985, 33 y.o.   MRN: [id number]   D: Pt alert and oriented on the unit.   A: Education, support, and encouragement provided. Discharge summary, medications, and follow up appointments reviewed with pt. Suicide prevention resources provided, including "My 3 App." Pt's belongings in locker returned.  R: Pt denies SI/HI, A/VH, or any concerns at this time. Pt ambulatory on and off unit. Pt discharged to lobby.

## 2019-03-19 NOTE — Discharge Instructions (Signed)
For your behavioral health needs you are advised to follow up with [facility].  New patients are seen at their walk-in clinic.  Walk-in hours are Monday - Friday from 8:30 am - 12:00 pm, and from 1:00 pm - 2:30 pm.  Walk-in patients are seen on a first come, first served basis, so try to arrive as early as possible for the best chance of being seen the same day:       [facility]      [address]      [place], [place] [address]      [phone] 

## 2019-03-19 NOTE — Consult Note (Addendum)
[facility]  03/19/2019 1:35 PM Derrick [name]  MRN:  [id number] Principal Problem: Cocaine dependence with cocaine-induced mood disorder [facility](HCC) Discharge Diagnoses: Principal Problem:   Cocaine dependence with cocaine-induced mood disorder (HCC) Active Problems:   MDD (major depressive disorder)   Subjective: Derrick [name], 33 y.o., male patient seen face to face by this provider; chart reviewed and consulted with Dr. Nelva BushNorma on 03/19/19.  On evaluation Derrick [name] reports that has just been released from jail. States that he is having a problem staying off of drugs. He lives with his family at this time. He has not had any treatment for his mental health or drug abuse issues.    During evaluation Derrick [name] is sitting on the bed; he is alert/oriented x 4; calm/cooperative; and mood congruent with affect.  Patient is speaking in a clear tone at moderate volume, and normal pace; with good eye contact.  His thought process is coherent and relevant; There is no indication that he is currently responding to internal/external stimuli or experiencing delusional thought content.  Patient denies suicidal/self-harm/homicidal ideation, psychosis, and paranoia.  Patient has remained calm throughout assessment and has answered questions appropriately.   Recommendation: Discharge patient home with follow-up resources.  Disposition:  Patient psychiatrically cleared  No evidence of imminent risk to self or others at present.   Patient does not meet criteria for psychiatric inpatient admission.   Spoke with Dr. Sharma CovertNorman; informed of above recommendation and disposition   Shuvon Rankin, NP  Total Time spent with patient: 20 minutes  Past Psychiatric History:   Past Medical History:  Past Medical History:  Diagnosis Date  . Anxiety   . Bipolar 1 disorder (HCC)   . Headache(784.0)   . Hypertension   . Substance abuse (HCC)    History reviewed. No pertinent surgical  history. Family History:  Family History  Problem Relation Age of Onset  . Cancer Mother        Lung   Family Psychiatric  History: None per chart review.  Social History:  Social History   Substance and Sexual Activity  Alcohol Use Yes   Comment: 1 pint of Jim Beam     Social History   Substance and Sexual Activity  Drug Use Yes  . Types: Marijuana, "Crack" cocaine, Other-see comments, Methamphetamines   Comment: heroin-in [place]    Social History   Socioeconomic History  . Marital status: Single    Spouse name: Not on file  . Number of children: Not on file  . Years of education: Not on file  . Highest education level: Not on file  Occupational History  . Not on file  Social Needs  . Financial resource strain: Not on file  . Food insecurity    Worry: Not on file    Inability: Not on file  . Transportation needs    Medical: Not on file    Non-medical: Not on file  Tobacco Use  . Smoking status: Current Every Day Smoker    Packs/day: 2.00    Types: Cigarettes  . Smokeless tobacco: Never Used  Substance and Sexual Activity  . Alcohol use: Yes    Comment: 1 pint of Jim Beam  . Drug use: Yes    Types: Marijuana, "Crack" cocaine, Other-see comments, Methamphetamines    Comment: heroin-in [place]  . Sexual activity: Not on file  Lifestyle  . Physical activity    Days per week: Not on file    Minutes per  session: Not on file  . Stress: Not on file  Relationships  . Social Musicianconnections    Talks on phone: Not on file    Gets together: Not on file    Attends religious service: Not on file    Active member of club or organization: Not on file    Attends meetings of clubs or organizations: Not on file    Relationship status: Not on file  Other Topics Concern  . Not on file  Social History Narrative  . Not on file    Has this patient used any form of tobacco in the last 30 days? (Cigarettes, Smokeless Tobacco, Cigars, and/or Pipes) A prescription for an  FDA-approved tobacco cessation medication was offered at discharge and the patient refused  Current Medications: Current Facility-Administered Medications  Medication Dose Route Frequency Provider Last Rate Last Dose  . acetaminophen (TYLENOL) tablet 650 mg  650 mg Oral Q6H PRN Oneta RackLewis, Tanika N, NP   650 mg at 03/18/19 1254  . alum & mag hydroxide-simeth (MAALOX/MYLANTA) 200-200-20 MG/5ML suspension 30 mL  30 mL Oral Q4H PRN Oneta RackLewis, Tanika N, NP      . gabapentin (NEURONTIN) capsule 400 mg  400 mg Oral TID Thedore MinsAkintayo, Mojeed, MD   400 mg at 03/19/19 1136  . hydrOXYzine (ATARAX/VISTARIL) tablet 25 mg  25 mg Oral TID PRN Oneta RackLewis, Tanika N, NP      . loperamide (IMODIUM) capsule 2-4 mg  2-4 mg Oral PRN Oneta RackLewis, Tanika N, NP      . LORazepam (ATIVAN) tablet 1 mg  1 mg Oral Q6H PRN Oneta RackLewis, Tanika N, NP      . magnesium hydroxide (MILK OF MAGNESIA) suspension 30 mL  30 mL Oral Daily PRN Oneta RackLewis, Tanika N, NP      . multivitamin with minerals tablet 1 tablet  1 tablet Oral Daily Oneta RackLewis, Tanika N, NP   1 tablet at 03/19/19 0858  . nicotine polacrilex (NICORETTE) gum 2 mg  2 mg Oral PRN Gillermo Murdochhompson, Strummer Canipe, NP      . ondansetron (ZOFRAN-ODT) disintegrating tablet 4 mg  4 mg Oral Q6H PRN Oneta RackLewis, Tanika N, NP      . thiamine (B-1) injection 100 mg  100 mg Intramuscular Once Oneta RackLewis, Tanika N, NP      . thiamine (VITAMIN B-1) tablet 100 mg  100 mg Oral Daily Oneta RackLewis, Tanika N, NP   100 mg at 03/19/19 0858  . traZODone (DESYREL) tablet 50 mg  50 mg Oral QHS PRN Oneta RackLewis, Tanika N, NP       PTA Medications: Medications Prior to Admission  Medication Sig Dispense Refill Last Dose  . cloNIDine HCl (KAPVAY) 0.1 MG TB12 ER tablet Take 1 tablet (0.1 mg total) by mouth at bedtime. (Patient not taking: Reported on 03/17/2019) 30 tablet 0   . FLUoxetine (PROZAC) 20 MG capsule Take 1 capsule (20 mg total) by mouth daily. (Patient not taking: Reported on 03/17/2019) 30 capsule 0     Musculoskeletal: Strength & Muscle Tone: within  normal limits Gait & Station: normal Patient leans: N/A  Psychiatric Specialty Exam: Physical Exam  Nursing note and vitals reviewed. Constitutional: He appears well-developed.  Eyes: Pupils are equal, round, and reactive to light.  Cardiovascular: Normal rate.  Musculoskeletal: Normal range of motion.  Neurological: He is alert.  Skin: Skin is warm, dry and intact.  Psychiatric: He has a normal mood and affect. His speech is normal and behavior is normal. Judgment and thought content normal. Cognition and memory  are normal.    Review of Systems  Constitutional: Negative.   Psychiatric/Behavioral: Negative for hallucinations, substance abuse and suicidal ideas. The patient is not nervous/anxious.   All other systems reviewed and are negative.   Blood pressure 114/77, pulse 85, temperature 98.8 F (37.1 C), temperature source Oral, resp. rate 16, SpO2 97 %.There is no height or weight on file to calculate BMI.  General Appearance: Casual  Eye Contact:  Good  Speech:  Clear and Coherent  Volume:  Normal  Mood:  Euthymic  Affect:  Congruent  Thought Process:  Coherent and Descriptions of Associations: Intact  Orientation:  Full (Time, Place, and Person)  Thought Content:  WDL  Suicidal Thoughts:  No  Homicidal Thoughts:  No  Memory:  Immediate;   Good Recent;   Good Remote;   Good  Judgement:  Good  Insight:  Fair  Psychomotor Activity:  Normal  Concentration:  Concentration: Good  Recall:  Good  Fund of Knowledge:  Fair  Language:  Good  Akathisia:  NA  Handed:  Right  AIMS (if indicated):   N/A  Assets:  Communication Skills Desire for Improvement  ADL's:  Intact  Cognition:  WNL  Sleep:   N/A     Demographic Factors:  Male, Caucasian, Low socioeconomic status and Unemployed  Loss Factors: Loss of significant relationship and Legal issues  Historical Factors: Family history of mental illness or substance abuse  Risk Reduction Factors:   NA  Continued  Clinical Symptoms:  Depression:   Hopelessness Alcohol/Substance Abuse/Dependencies  Cognitive Features That Contribute To Risk:  None    Suicide Risk:  Minimal: No identifiable suicidal ideation.  Patients presenting with no risk factors but with morbid ruminations; may be classified as minimal risk based on the severity of the depressive symptoms    Plan Of Care/Follow-up recommendations:  Activity:  as tolerated   Disposition: Patient is being discharged home with follow-up resources.  Deloria Lair, NP 03/19/2019, 1:35 PM   Patient seen face-to-face for psychiatric evaluation, chart reviewed and case discussed with the physician extender and developed treatment plan. Reviewed the information documented and agree with the treatment plan.  Buford Dresser, DO 03/19/19 6:21 PM

## 2019-03-19 NOTE — BH Assessment (Addendum)
[facility] Assessment Progress Note  Per Buford Dresser, DO, this pt does not require psychiatric hospitalization at this time.  Pt is to be discharged from the [facility] Observation Unit with recommendation to follow up with [facility] [place].  This has been included in pt's discharge instructions.  Pt would also benefit from seeing Peer Support Specialists; they will be asked to speak to pt.  Pt's nurse, Benjamine Mola, has been notified.  Jalene Mullet, [place] Triage Specialist [phone]

## 2019-03-21 NOTE — Discharge Summary (Addendum)
Discharge summary completed   Patient's chart reviewed. Reviewed the information documented and agree with the treatment plan.  Buford Dresser, DO 03/21/19 4:09 PM

## 2019-03-29 ENCOUNTER — Other Ambulatory Visit: Payer: Self-pay

## 2019-03-29 ENCOUNTER — Emergency Department (HOSPITAL_COMMUNITY)
Admission: EM | Admit: 2019-03-29 | Discharge: 2019-03-30 | Disposition: A | Discharge status: Home / Self Care | Payer: Self-pay | Attending: Emergency Medicine | Admitting: Emergency Medicine

## 2019-03-29 ENCOUNTER — Encounter (HOSPITAL_COMMUNITY): Payer: Self-pay | Admitting: Emergency Medicine

## 2019-03-29 DIAGNOSIS — I10 Essential (primary) hypertension: Secondary | ICD-10-CM | POA: Insufficient documentation

## 2019-03-29 DIAGNOSIS — F419 Anxiety disorder, unspecified: Secondary | ICD-10-CM | POA: Insufficient documentation

## 2019-03-29 DIAGNOSIS — F17210 Nicotine dependence, cigarettes, uncomplicated: Secondary | ICD-10-CM | POA: Insufficient documentation

## 2019-03-29 NOTE — ED Triage Notes (Signed)
Patient reports worsening anxiety and panic attacks today. Recently d/c from [facility]. Scheduled to go to [facility] tomorrow. States he needs medications that he no longer takes removed from his medication list in order to be assessed there.

## 2019-03-29 NOTE — ED Notes (Signed)
No respiratory or acute distress noted alert and oriented x 3 call light in reach. 

## 2019-03-29 NOTE — ED Provider Notes (Signed)
Emergency Department Provider Note   I have reviewed the triage vital signs and the nursing notes.   HISTORY  Chief Complaint Anxiety   HPI Derrick [name] is a 33 y.o. male who presents to the emergency department today secondary to the need for medication management.  Patient states that he was a [facility] and it so that he needed to have documentation that he had not been taking his clonidine or his Prozac before they would see him.  He states he has no complaints.  Has no suicidal homicidal ideation.  He has no medical complaints.  Review of systems was negative.  He did say that he was hungry after walking here [facility].   No other associated or modifying symptoms.    Past Medical History:  Diagnosis Date  . Anxiety   . Bipolar 1 disorder (HCC)   . Headache(784.0)   . Hypertension   . Substance abuse [facility](HCC)     Patient Active Problem List   Diagnosis Date Noted  . Cocaine dependence with cocaine-induced mood disorder (HCC) 03/18/2019  . ADHD (attention deficit hyperactivity disorder), combined type 01/18/2015  . Major depressive disorder, recurrent, severe without psychotic behavior (HCC) 01/17/2015  . Polysubstance abuse (HCC) 05/02/2013  . MDD (major depressive disorder) 05/02/2013  . Suicide attempt (HCC) 05/02/2013  . Suicidal ideation 05/02/2013  . Impulse control disorder, unspecified 01/04/2013  . Unspecified episodic mood disorder 01/04/2013    History reviewed. No pertinent surgical history.  Current Outpatient Rx  . Order #: [id number] Class: Print  . Order #: [id number] Class: Print    Allergies Penicillins  Family History  Problem Relation Age of Onset  . Cancer Mother        Lung    Social History Social History   Tobacco Use  . Smoking status: Current Every Day Smoker    Packs/day: 2.00    Types: Cigarettes  . Smokeless tobacco: Never Used  Substance Use Topics  . Alcohol use: Yes    Comment: 1 pint of Jim Beam  . Drug use:  Yes    Types: Marijuana, "Crack" cocaine, Other-see comments, Methamphetamines    Comment: heroin-in [place]    Review of Systems  All other systems negative except as documented in the HPI. All pertinent positives and negatives as reviewed in the HPI. ____________________________________________   PHYSICAL EXAM:  VITAL SIGNS: ED Triage Vitals [03/29/19 2055]  Enc Vitals Group     BP (!) 155/93     Pulse Rate 99     Resp 20     Temp 99.3 F (37.4 C)     Temp Source Oral     SpO2 98 %     Weight     Constitutional: Alert and oriented. Well appearing and in no acute distress. Eyes: Conjunctivae are normal. PERRL. EOMI. Head: Atraumatic. Nose: No congestion/rhinnorhea. Mouth/Throat: Mucous membranes are moist.  Oropharynx non-erythematous. Neck: No stridor.  No meningeal signs.   Cardiovascular: Normal rate, regular rhythm. Good peripheral circulation. Grossly normal heart sounds.   Respiratory: Normal respiratory effort.  No retractions. Lungs CTAB. Gastrointestinal: Soft and nontender. No distention.  Musculoskeletal: No lower extremity tenderness nor edema. No gross deformities of extremities. Neurologic:  Normal speech and language. No gross focal neurologic deficits are appreciated.  Skin:  Skin is warm, dry and intact. No rash noted.   ____________________________________________    INITIAL IMPRESSION / ASSESSMENT AND PLAN / ED COURSE  Patient here for medications to be amended.  Given a  sandwich.  No medical or psychiatric complaint this time.  Pertinent labs & imaging results that were available during my care of the patient were reviewed by me and considered in my medical decision making (see chart for details).  A medical screening exam was performed and I feel the patient has had an appropriate workup for their chief complaint at this time and likelihood of emergent condition existing is low. They have been counseled on decision, discharge, follow up and which  symptoms necessitate immediate return to the emergency department. They or their family verbally stated understanding and agreement with plan and discharged in stable condition.   ____________________________________________  FINAL CLINICAL IMPRESSION(S) / ED DIAGNOSES  Final diagnoses:  Anxiety     MEDICATIONS GIVEN DURING THIS VISIT:  Medications - No data to display   NEW OUTPATIENT MEDICATIONS STARTED DURING THIS VISIT:  Current Discharge Medication List      Note:  This note was prepared with assistance of Dragon voice recognition software. Occasional wrong-word or sound-a-like substitutions may have occurred due to the inherent limitations of voice recognition software.   Robie Mcniel, Corene Cornea, MD 03/30/19 [phone]

## 2019-03-29 NOTE — ED Notes (Signed)
Gave report to [employer].

## 2019-07-06 ENCOUNTER — Encounter (HOSPITAL_BASED_OUTPATIENT_CLINIC_OR_DEPARTMENT_OTHER): Payer: Self-pay | Admitting: Emergency Medicine

## 2019-07-06 ENCOUNTER — Emergency Department (HOSPITAL_BASED_OUTPATIENT_CLINIC_OR_DEPARTMENT_OTHER)
Admission: EM | Admit: 2019-07-06 | Discharge: 2019-07-06 | Disposition: A | Discharge status: Rehabilitation Facility | Payer: Self-pay | Attending: Emergency Medicine | Admitting: Emergency Medicine

## 2019-07-06 ENCOUNTER — Other Ambulatory Visit: Payer: Self-pay

## 2019-07-06 DIAGNOSIS — F17210 Nicotine dependence, cigarettes, uncomplicated: Secondary | ICD-10-CM | POA: Insufficient documentation

## 2019-07-06 DIAGNOSIS — Z79899 Other long term (current) drug therapy: Secondary | ICD-10-CM | POA: Insufficient documentation

## 2019-07-06 DIAGNOSIS — R39198 Other difficulties with micturition: Secondary | ICD-10-CM | POA: Insufficient documentation

## 2019-07-06 DIAGNOSIS — F1420 Cocaine dependence, uncomplicated: Secondary | ICD-10-CM | POA: Insufficient documentation

## 2019-07-06 DIAGNOSIS — I10 Essential (primary) hypertension: Secondary | ICD-10-CM | POA: Insufficient documentation

## 2019-07-06 LAB — URINALYSIS, ROUTINE W REFLEX MICROSCOPIC
Bilirubin Urine: NEGATIVE
Glucose, UA: NEGATIVE mg/dL
Hgb urine dipstick: NEGATIVE
Ketones, ur: NEGATIVE mg/dL
Leukocytes,Ua: NEGATIVE
Nitrite: NEGATIVE
Protein, ur: NEGATIVE mg/dL
Specific Gravity, Urine: 1.015 (ref 1.005–1.030)
pH: 7 (ref 5.0–8.0)

## 2019-07-06 NOTE — ED Triage Notes (Signed)
Long hx of difficulty getting urinary stream started.  Pt in drug rehab and also on probation and has to produce urine specimens. He has been having issues with difficulty urinating for years and now he has to do it with someone watching.  He is looking for something that will make it easier for him to void.

## 2019-07-06 NOTE — Discharge Instructions (Addendum)
Stay hydrated, drink plenty of water. Schedule an appointment with the urologist for further management.

## 2019-07-06 NOTE — ED Provider Notes (Signed)
[facility] EMERGENCY DEPARTMENT Provider Note   CSN: [id number] Arrival date & time: 07/06/19  0932     History   Chief Complaint Chief Complaint  Patient presents with  . Difficulty urination    HPI Derrick [name] is a 33 y.o. male with past medical history of polysubstance abuse, hypertension, anxiety, bipolar 1 disorder, presenting to the emergency department with complaint of longstanding history of difficulty urinating.  He states his symptoms include difficulty initiating stream feeling as though he needs to push" to continue his stream.  He states he is currently in a drug rehab program and on probation requiring frequent drug testing where he needs to be monitored while he urinates.  He states this is causing deficient difficulty for him as he feels as though it is more difficult for him to urinate when somebody is watching him.  He reports some mild discomfort with urination though no other associated symptoms.  No known history of STD or prostate problem.     The history is provided by the patient.    Past Medical History:  Diagnosis Date  . Anxiety   . Bipolar 1 disorder (HCC)   . Headache(784.0)   . Hypertension   . Substance abuse [facility])     Patient Active Problem List   Diagnosis Date Noted  . Cocaine dependence with cocaine-induced mood disorder (HCC) 03/18/2019  . ADHD (attention deficit hyperactivity disorder), combined type 01/18/2015  . Major depressive disorder, recurrent, severe without psychotic behavior (HCC) 01/17/2015  . Polysubstance abuse (HCC) 05/02/2013  . MDD (major depressive disorder) 05/02/2013  . Suicide attempt (HCC) 05/02/2013  . Suicidal ideation 05/02/2013  . Impulse control disorder, unspecified 01/04/2013  . Unspecified episodic mood disorder 01/04/2013    History reviewed. No pertinent surgical history.      Home Medications    Prior to Admission medications   Medication Sig Start Date End Date Taking?  Authorizing Provider  sertraline (ZOLOFT) 50 MG tablet Take 25 mg by mouth daily.   Yes [provider]  Multiple Vitamin (MULTIVITAMIN WITH MINERALS) TABS tablet Take 1 tablet by mouth daily. 03/20/19   Jearld Lesch, NP  thiamine 100 MG tablet Take 1 tablet (100 mg total) by mouth daily. 03/20/19   Jearld Lesch, NP  cloNIDine HCl (KAPVAY) 0.1 MG TB12 ER tablet Take 1 tablet (0.1 mg total) by mouth at bedtime. Patient not taking: Reported on 03/17/2019 01/23/15 03/29/19  Adonis Brook, NP    Family History Family History  Problem Relation Age of Onset  . Cancer Mother        Lung    Social History Social History   Tobacco Use  . Smoking status: Current Every Day Smoker    Packs/day: 1.00    Types: Cigarettes  . Smokeless tobacco: Never Used  Substance Use Topics  . Alcohol use: Not Currently  . Drug use: Not Currently    Types: Marijuana, "Crack" cocaine, Other-see comments, Methamphetamines    Comment: [facility] - 94 days clean     Allergies   Penicillins   Review of Systems Review of Systems  Genitourinary: Positive for difficulty urinating.  All other systems reviewed and are negative.    Physical Exam Updated Vital Signs BP (!) 128/92 (BP Location: Left Arm)   Pulse 86   Temp 98.8 F (37.1 C) (Oral)   Resp 16   Ht 6' (1.829 m)   Wt 93.5 kg   SpO2 99%   BMI  27.95 kg/m   Physical Exam Vitals signs and nursing note reviewed.  Constitutional:      General: He is not in acute distress.    Appearance: He is well-developed.  HENT:     Head: Normocephalic and atraumatic.  Eyes:     Conjunctiva/sclera: Conjunctivae normal.  Cardiovascular:     Rate and Rhythm: Normal rate.  Pulmonary:     Effort: Pulmonary effort is normal.  Abdominal:     Palpations: Abdomen is soft.  Skin:    General: Skin is warm.  Neurological:     Mental Status: He is alert.  Psychiatric:        Behavior: Behavior normal.      ED Treatments /  Results  Labs (all labs ordered are listed, but only abnormal results are displayed) Labs Reviewed  URINALYSIS, ROUTINE W REFLEX MICROSCOPIC    EKG None  Radiology No results found.  Procedures Procedures (including critical care time)  Medications Ordered in ED Medications - No data to display   Initial Impression / Assessment and Plan / ED Course  I have reviewed the triage vital signs and the nursing notes.  Pertinent labs & imaging results that were available during my care of the patient were reviewed by me and considered in my medical decision making (see chart for details).        Patient presenting with longstanding history of difficulty initiating urine stream.  He states this is starting to cause him issue as he is now on probation and in drug rehab, requiring frequent monitored urine drug screening.  His letter scan today reveals a post void residual of only 89 mL.  No known history of prostate problem or recurrent STD.  UA is neg.  Pt discussed with Dr. Gilford Raid. At this time recommend conservative management including frequent hydration with p.o. water, as well as continue his prescribed antianxiety medication. Outpt urology referral provided.  Final Clinical Impressions(s) / ED Diagnoses   Final diagnoses:  Difficulty urinating    ED Discharge Orders    None       Annette Bertelson, [place] N, PA-C 07/06/19 1200    Isla Pence, MD 07/06/19 1410

## 2019-09-28 DIAGNOSIS — Z5989 Other problems related to housing and economic circumstances: Secondary | ICD-10-CM | POA: Insufficient documentation

## 2019-09-28 DIAGNOSIS — Z598 Other problems related to housing and economic circumstances: Secondary | ICD-10-CM | POA: Insufficient documentation

## 2019-09-28 DIAGNOSIS — Z5971 Insufficient health insurance coverage: Secondary | ICD-10-CM | POA: Insufficient documentation

## 2019-09-28 NOTE — Progress Notes (Signed)
Patient Name: Derrick [name]  Date of Birth: 10-24-1985  MRN: [id number]  PCP: Patient, No Pcp Per  Referring Provider: Marcie Mowers, FNP, Ph#: [phone]   Patient Active Problem List   Diagnosis Date Noted  . Chronic hepatitis C without hepatic coma ([place]) 10/01/2019  . Underinsured 09/28/2019  . Cocaine dependence with cocaine-induced mood disorder ([place]) 03/18/2019  . ADHD (attention deficit hyperactivity disorder), combined type 01/18/2015  . Major depressive disorder, recurrent, severe without psychotic behavior ([place]) 01/17/2015  . Polysubstance abuse ([place]) 05/02/2013  . MDD (major depressive disorder) 05/02/2013  . Suicide attempt ([place]) 05/02/2013  . Impulse control disorder, unspecified 01/04/2013  . Unspecified episodic mood disorder 01/04/2013    CC:  New patient - initial evaluation and management of chronic hepatitis C infection.     HPI/ROS:  Derrick [name] is a 34 y.o. male.   First learned about positive hepatitis C exposure during previous hospitalization for overdose 3.5 years ago. His previous long-term girlfriend (whom is now deceased) had chronic hepatitis C and was not treated and they shared needles during previous drug use.   He is currently living in transitional housing and completed drug rehab outpatient with ongoing work on abstaining and "getting himself together." Recent wellness exam with [facility]. Major health concerns today include nausea, headaches, tension, fatigue and insomnia.   Outside labs collected 09/07/19 indicate pre diabetes (HgbA1C 5.8%), AST 30, ALT 48, TBili 0.4, Cr 0.92. RPR non reactive, HIV non reactive. Hep C Ab (+) > 11.0.    Patient does not have documented immunity to Hepatitis A. Patient does not have documented immunity to Hepatitis B.  No platelet level on file.    Review of Systems: Constitutional: negative for fevers, chills, malaise and anorexia; positive for fatigue Eyes:  negative for icterus Respiratory: negative for negative Cardiovascular: negative for dyspnea, orthopnea, lower extremity edema Gastrointestinal: positive for nausea, negative for vomiting, abdominal pain and jaundice Musculoskeletal: positive for arthralgias Neurological: negative for coordination problems, tremor and weakness Behavioral/Psych: negative for excessive alcohol consumption and illegal drug usage All other systems reviewed and are negative       Past Medical History:  Diagnosis Date  . Anxiety   . Bipolar 1 disorder ([place])   . Headache(784.0)   . Hypertension   . Substance abuse ([place])     Prior to Admission medications   Medication Sig Start Date End Date Taking? Authorizing Provider  Multiple Vitamin (MULTIVITAMIN WITH MINERALS) TABS tablet Take 1 tablet by mouth daily. 03/20/19   Deloria Lair, NP  sertraline (ZOLOFT) 50 MG tablet Take 25 mg by mouth daily.    [provider]  thiamine 100 MG tablet Take 1 tablet (100 mg total) by mouth daily. 03/20/19   Deloria Lair, NP  cloNIDine HCl (KAPVAY) 0.1 MG TB12 ER tablet Take 1 tablet (0.1 mg total) by mouth at bedtime. Patient not taking: Reported on 03/17/2019 01/23/15 03/29/19  Kerrie Buffalo, NP    Allergies  Allergen Reactions  . Penicillins Nausea Only    Did it involve swelling of the face/tongue/throat, SOB, or low BP? unknown Did it involve sudden or severe rash/hives, skin peeling, or any reaction on the inside of your mouth or nose? yes Did you need to seek medical attention at a hospital or doctor's office? yes When did it last happen?03/16/2019 If all above answers are "NO", may proceed with cephalosporin use.     Social History   Tobacco  Use  . Smoking status: Current Every Day Smoker    Packs/day: 1.00    Types: Cigarettes  . Smokeless tobacco: Never Used  Substance Use Topics  . Alcohol use: Not Currently  . Drug use: Not Currently    Types: Marijuana, "Crack" cocaine,  Other-see comments, Methamphetamines    Comment: [facility] - 94 days clean    Family History  Problem Relation Age of Onset  . Cancer Mother        Lung     Objective:   Vitals:   10/01/19 1019  BP: 134/80  Pulse: 73  Temp: (!) 97.5 F (36.4 C)  SpO2: 99%   Constitutional: in no apparent distress and well developed and well nourished Eyes: anicteric Cardiovascular: Cor RRR and No murmurs Respiratory: clear Gastrointestinal: Bowel sounds are normal, liver is not enlarged, spleen is not enlarged Musculoskeletal: peripheral pulses normal, no pedal edema, no clubbing or cyanosis Skin: negative for - jaundice, spider hemangioma, telangiectasia, palmar erythema, ecchymosis and atrophy; no porphyria cutanea tarda Lymphatic: no cervical lymphadenopathy   Laboratory: Genotype: No results found for: HCVGENOTYPE HCV viral load: No results found for: HCVQUANT Lab Results  Component Value Date   WBC 10.9 (H) 03/17/2019   HGB 14.8 03/17/2019   HCT 43.4 03/17/2019   MCV 87.9 03/17/2019   PLT 262 03/17/2019    Lab Results  Component Value Date   CREATININE 0.92 03/17/2019   BUN 30 (H) 03/17/2019   NA 140 03/17/2019   K 3.6 03/17/2019   CL 104 03/17/2019   CO2 27 03/17/2019    Lab Results  Component Value Date   ALT 59 (H) 03/17/2019   AST 151 (H) 03/17/2019   ALKPHOS 57 03/17/2019    Lab Results  Component Value Date   BILITOT 1.2 03/17/2019   ALBUMIN 4.2 03/17/2019     Imaging:  none  Assessment & Plan:   Problem List Items Addressed This Visit      Unprioritized   Underinsured    Provided information regarding [employer], [employer] and Patient Assistance to secure medication.       Polysubstance abuse (HCC) (Chronic)    Completed drug rehab program and now in transitional program.       Chronic hepatitis C without hepatic coma (HCC) - Primary    New Patient with Chronic Hepatitis C genotype unknown, treatment naive.  Fibrosis risk unknown.   I discussed with the patient the lab findings that confirm chronic hepatitis C as well as the natural history and progression of disease including about 30% of people who develop cirrhosis of the liver if left untreated and once cirrhosis is established there is a 2-7% risk per year of liver cancer and liver failure.  I discussed the importance of treatment and benefits in reducing the risk, even if significant liver fibrosis exists. I also discussed risk for re-infection following treatment should he not continue to modify risk factors.    Patient counseled extensively on limiting acetaminophen to no more than 2 grams daily, avoidance of alcohol.  Transmission discussed with patient including sexual transmission, sharing razors and toothbrush.   Will need referral to gastroenterology if concern for cirrhosis  S/P drug rehab/recovery.   Mavyret vs Harvoni based on Genotype for shortest course.    Hepatitis A and B titers to be drawn today with appropriate vaccinations as needed   Pneumovax vaccine will also need to be given.   Further work up to include liver staging  through non-invasive serum analysis with APRI and FIB4 scores and Liver Fibrosis panel; U/S to follow if discordant or concerning results.   Check platelet count today today   Will call JERRELL MANGEL back once all results are in and counsel on medication over the phone. He will return 4 weeks after starting to meet with pharmacy team and check RNA at that time.        Relevant Orders   Hepatitis C RNA quantitative   Liver Fibrosis, FibroTest-ActiTest   CBC   Hepatitis C genotype      I spent 45 minutes with the patient including greater than 70% of time in face to face counsel of the patient re hepatitis c and the details described above and in coordination of their care.  Rexene Alberts, MSN, NP-C [facility]    [email or web address] Pager: [phone] Office: [phone] [facility] Main Line: [phone]

## 2019-10-01 ENCOUNTER — Ambulatory Visit (INDEPENDENT_AMBULATORY_CARE_PROVIDER_SITE_OTHER): Payer: Self-pay | Admitting: Infectious Diseases

## 2019-10-01 ENCOUNTER — Telehealth: Payer: Self-pay | Admitting: Pharmacy Technician

## 2019-10-01 ENCOUNTER — Encounter: Payer: Self-pay | Admitting: Infectious Diseases

## 2019-10-01 ENCOUNTER — Other Ambulatory Visit: Payer: Self-pay

## 2019-10-01 VITALS — BP 134/80 | HR 73 | Temp 97.5°F | Ht 72.0 in | Wt 213.0 lb

## 2019-10-01 DIAGNOSIS — F1910 Other psychoactive substance abuse, uncomplicated: Secondary | ICD-10-CM

## 2019-10-01 DIAGNOSIS — Z5989 Other problems related to housing and economic circumstances: Secondary | ICD-10-CM

## 2019-10-01 DIAGNOSIS — B182 Chronic viral hepatitis C: Secondary | ICD-10-CM

## 2019-10-01 DIAGNOSIS — Z598 Other problems related to housing and economic circumstances: Secondary | ICD-10-CM

## 2019-10-01 NOTE — Patient Instructions (Signed)
Nice to meet you today!    We need to get a little more information about your hepatitis c infection before we start your treatment. I anticipate that we can get you started in a few weeks after we submit approval to your insurance to ensure payment. We may need to place referral for an ultrasound and/or gastroenterology if your blood work indicates more damage to the liver than expected.      ABOUT HEPATITIS C VIRUS:   Chronic Hepatitis C is the most common blood-borne infection in the [place], affecting approximately 3 million people.   It is the leading cause of cirrhosis, liver cancer, and end stage liver disease requiring transplantation when this infection goes untreated for many years   The majority of people who are infected are unaware because there are not many early symptoms that are specific to this and often go undiagnosed until a specific blood test is drawn.    The hepatitis c virus is passed primarily through direct exposure of contaminated blood or body fluids. It is most efficiently transmitted through repeated exposure to infected blood.   Risk for sexual transmission is very low but is possible if there is high frequency of unprotected sexual activity with known hepatitis c partner or multiple partners of known status.   Over time, approximately 60-70% of people can develop some degree of liver disease. Cirrhosis occurs in 10-20% of those with chronic infection. 1-5% will get liver cancer, which has a very high rate of death.    Approximately 15-25% clear the infection without medication (usually in the first 6 months of becoming exposed to virus)   Newer medications provide over 95% cure rate when taken as prescribed    IN GENERAL ABOUT DIET  . Persons living with chronic hepatitis c infection should eat a diet to maintain a healthy weight and avoid nutritional deficiencies.   . Completely avoiding alcohol is the best decision for your liver health. If  unable to do so please limit alcohol to as little as possible to less than 1 standard drink a day - this is very irritating to your liver.  . Limit tylenol use to less than 2,000 mg daily (two extra strength tablets only twice a day)  . If you have cirrhosis of the liver please take no more than 1,000 mg tylenol a day  . Patients with cirrhosis should not have protein restriction; we recommend a protein intake of approximately 1.2-1.5 g/kg/day.   . For patients with cirrhosis and hepatic encephalopathy, the American Association for the Study of Liver Diseases (AASLD) recommended protein intake is 1.2-1.5 g/kg/day.  . If you experience ascites (fluid accumulation in the abdomen associated with severe liver damage / cirrhosis) please limit sodium intake to < 2000 mg a day    UNTIL YOU HAVE BEEN TREATED AND CURED:  . Use condoms with all sexual encounters or practice abstinence to avoid sexual transmission   . No sharing of razors, toothbrushes, nail clippers or anything that could potentially have blood on it.   . If you cut yourself please clean and cover any wounds or open sores to others do not come into contact with your blood.   . If blood spills onto item/surface please clean with 1:10 bleach solution and allow to dry, EVEN if it is dried blood.    GENERAL HELPFUL HINTS ON HCV THERAPY:  1. Stay well-hydrated.  2. Notify the ID Clinic of any changes in your other over-the-counter/herbal or prescription medications.    3. If you miss a dose of your medication, take the missed dose as soon as you remember. Return to your regular time/dose schedule the next day.   4.  Do not stop taking your medications without first talking with your healthcare provider.  5.  You will see our pharmacist-specialist within the first 2 weeks of starting your medication to monitor for any possible side effects.  6.  You will have blood work once during treatment 4 weeks after your first pill. Again  soon after treatment is completed and one final lab 3 months after your last pill to ensure cure!   TIPS TO BE SUCCESSFUL WITH DAILY MEDICATION USE:  1. Set a reminder on your phone  2. Try filling out a pill box for the week - pick a day and put one pill for every day during the week so you know right away if you missed a pill.   3. Have a trusted family member ask you about your medications.   4. Smartphone app    Medication we would like to use for you will be : MAVYRET  Mavyret Instructions:  1. Take Mavyret, three tablets (at the same time) daily with food. Please take ALL THREE PILLS AT ONCE. You should take it at approximately the same time every day. Treatment will be for 8 weeks. Do not miss a dose.    2. Do not run out of Mavyret! If you are down to one week of medication left and have not heard about your next shipment, please let us know as soon as possible. You will be given 28 days of treatment at a time and will receive one refill.   3. If you need to start a new medication, prescription from your doctor or over the counter medication, you need to contact us to make sure it does not interfere with Mavyret. There are several medications that can interfere with Mavyret and can make you sick or make the medication not work.  4. If you need to take a medication for acid reflux, you can take omeprazole 20mg daily.     5. Tylenol (acetominophen) and Advil (ibuprofen) are safe to take with Harvoni if needed for headache, fever, pain.   6. IF YOU ARE ON BIRTH CONTROL PILLS, YOU RECEIVE A SHOT FOR BIRTH CONTROL, OR YOU HAVE AN IUD, please notify your provider to make sure it is safe with Mavyret.   7. DO NOT stop Mavyret unless instructed to by your provider. If you are hospitalized while taking this medication please bring it with you to the hospital to avoid interruption of therapy. Every pill is important!  8. The most common side effects associated with Mavyret include:   o Fatigue o Headache o Nausea o Diarrhea o Insomnia     

## 2019-10-01 NOTE — Assessment & Plan Note (Signed)
New Patient with Chronic Hepatitis C genotype unknown, treatment naive. Fibrosis risk unknown.   I discussed with the patient the lab findings that confirm chronic hepatitis C as well as the natural history and progression of disease including about 30% of people who develop cirrhosis of the liver if left untreated and once cirrhosis is established there is a 2-7% risk per year of liver cancer and liver failure.  I discussed the importance of treatment and benefits in reducing the risk, even if significant liver fibrosis exists. I also discussed risk for re-infection following treatment should he not continue to modify risk factors.    Patient counseled extensively on limiting acetaminophen to no more than 2 grams daily, avoidance of alcohol.  Transmission discussed with patient including sexual transmission, sharing razors and toothbrush.   Will need referral to gastroenterology if concern for cirrhosis  S/P drug rehab/recovery.   Mavyret vs Harvoni based on Genotype for shortest course.    Hepatitis A and B titers to be drawn today with appropriate vaccinations as needed   Pneumovax vaccine will also need to be given.   Further work up to include liver staging through non-invasive serum analysis with APRI and FIB4 scores and Liver Fibrosis panel; U/S to follow if discordant or concerning results.   Check platelet count today today   Will call Derrick [name] back once all results are in and counsel on medication over the phone. He will return 4 weeks after starting to meet with pharmacy team and check RNA at that time.

## 2019-10-01 NOTE — Assessment & Plan Note (Signed)
Completed drug rehab program and now in transitional program.

## 2019-10-01 NOTE — Telephone Encounter (Signed)
[facility] Patient Advocate Encounter    Findings of the benefits investigation conducted this morning via test claims for the patient's upcoming appointment on 02/08 are as follows:   Insurance: uninsured  Helped the patient fill out his [employer] and [employer] financial papers. He signed both applications for medication assistance and would like his medication shipped to clinic. He is currently in transitional housing and no income.   [facility] Patient Advocate will follow up once labs return.   Beulah Gandy, CPhT Specialty Pharmacy Patient [facility] for Infectious Disease Phone: [phone] Fax: [phone] 10/01/2019 11:17 AM

## 2019-10-01 NOTE — Assessment & Plan Note (Signed)
Provided information regarding [employer], [employer] and Patient Assistance to secure medication.

## 2019-10-05 LAB — LIVER FIBROSIS, FIBROTEST-ACTITEST
ALT: 19 U/L (ref 9–46)
Alpha-2-Macroglobulin: 134 mg/dL (ref 106–279)
Apolipoprotein A1: 184 mg/dL — ABNORMAL HIGH (ref 94–176)
Bilirubin: 0.6 mg/dL (ref 0.2–1.2)
Fibrosis Score: 0.04
GGT: 17 U/L (ref 3–90)
Haptoglobin: 173 mg/dL (ref 43–212)
Necroinflammat ACT Score: 0.05
Reference ID: 3265357

## 2019-10-05 LAB — CBC
HCT: 46.8 % (ref 38.5–50.0)
Hemoglobin: 16 g/dL (ref 13.2–17.1)
MCH: 30.2 pg (ref 27.0–33.0)
MCHC: 34.2 g/dL (ref 32.0–36.0)
MCV: 88.5 fL (ref 80.0–100.0)
MPV: 9.4 fL (ref 7.5–12.5)
Platelets: 314 10*3/uL (ref 140–400)
RBC: 5.29 10*6/uL (ref 4.20–5.80)
RDW: 12.8 % (ref 11.0–15.0)
WBC: 6.8 10*3/uL (ref 3.8–10.8)

## 2019-10-05 LAB — HEPATITIS C RNA QUANTITATIVE
HCV Quantitative Log: 4.46 Log IU/mL — ABNORMAL HIGH
HCV RNA, PCR, QN: 28700 IU/mL — ABNORMAL HIGH

## 2019-10-05 LAB — HEPATITIS C GENOTYPE

## 2019-10-10 ENCOUNTER — Telehealth: Payer: Self-pay | Admitting: Infectious Diseases

## 2019-10-10 ENCOUNTER — Other Ambulatory Visit: Payer: Self-pay | Admitting: Pharmacist

## 2019-10-10 ENCOUNTER — Telehealth: Payer: Self-pay | Admitting: Pharmacy Technician

## 2019-10-10 DIAGNOSIS — B182 Chronic viral hepatitis C: Secondary | ICD-10-CM

## 2019-10-10 MED ORDER — LEDIPASVIR-SOFOSBUVIR 90-400 MG PO TABS: 1.0000 | ORAL_TABLET | Freq: Every day | ORAL | 1 refills | Status: DC

## 2019-10-10 NOTE — Telephone Encounter (Addendum)
[facility] Patient Advocate Encounter  Completed and sent [employer] application for Harvoni for this patient who is uninsured.    Patient is approved.  [facility] pharmacy will ship to our clinic, per patient's request.  Kathie Rhodes E. Dimas Aguas CPhT Specialty Pharmacy Patient [facility] for Infectious Disease Phone: [phone] Fax:  [phone]

## 2019-10-10 NOTE — Telephone Encounter (Signed)
He has genotype 1a with hep c VL < 6 million and F0 indicating no fibrosis/cirrhosis risk. Discussed with him today over the phone and advised we will proceed with Harvoni for one pill once a day treatment x 8 weeks.   He appreciated the call. Medication will be shipped to the clinic

## 2019-10-22 ENCOUNTER — Telehealth: Payer: Self-pay | Admitting: Pharmacy Technician

## 2019-10-22 NOTE — Telephone Encounter (Signed)
[facility] Patient Advocate Encounter  Patient's medication, Derrick [name], has been shipped to arrive to clinic Wednesday, October 24, 2019.  He will take it for 8 weeks total, this is the first shipment of 4 weeks.  Once it arrives, he will need to be called for pick up and to have the pharmacist counsel him on how to take it properly.  Netty Starring. Dimas Aguas CPhT Specialty Pharmacy Patient [facility] for Infectious Disease Phone: [phone] Fax:  [phone]

## 2019-10-24 ENCOUNTER — Encounter: Payer: Self-pay | Admitting: Pharmacy Technician

## 2019-11-20 ENCOUNTER — Telehealth: Payer: Self-pay | Admitting: Pharmacy Technician

## 2019-11-20 NOTE — Telephone Encounter (Signed)
[facility] Patient Advocate Encounter  [facility] called this morning to schedule the patient's shipment of Harvoni. It is set to arrive on Wednesday, March 31. I left a voicemail with the patient to let him know of the medications arrival date. Based on documented start date this will make it close on when he will run out. Will call the patient again when the medication arrives to the clinic.

## 2019-11-21 ENCOUNTER — Telehealth: Payer: Self-pay | Admitting: Pharmacist

## 2019-11-21 NOTE — Telephone Encounter (Addendum)
[facility] Patient Advocate Encounter  Medication has arrived to the clinic. Left the patient a voicemail to coordinate pick up.   Patient called back and will try and coordinate to come tomorrow to pick up. He wanted to wait until his appointment but I was concerned as far as timing he would run out of medication before the 6th. He would also like a keychain to help adherence.

## 2019-11-21 NOTE — Telephone Encounter (Signed)
Patient is taking Harvoni x 8 weeks for his chronic Hepatitis C infection.  I tried to reach him multiple times before he started to counsel on the medication but was never able to reach him. He called today and asked to speak to someone about missing doses.  He states that he has missed 4 doses in the previous 3 weeks. I explained that the more he misses, the chances of him being cured after treatment declines. I encouraged him not to miss anymore doses as this is only a 2 month regimen. He became a little angry and stated that his missed doses were "out of his hands" and that he had a lot going on.  He also said that he refuses to carry around the bottle of Harvoni. I told I understood but those are the facts and to try his best to not miss anymore. His 2nd and final month is here at the clinic and he is going to "try" and pick it up this week.

## 2019-11-22 NOTE — Telephone Encounter (Addendum)
[facility] Patient Advocate Encounter  Patient called back and will have his friend, Lorin Picket, pick up the medication today before the clinic closes. He would also like a key chain placed in the bag.  Picked up and provided two keychains. I checked the identification of the friend and verified with Marcial Pacas before releasing medication.

## 2019-11-27 ENCOUNTER — Other Ambulatory Visit: Payer: Self-pay | Admitting: Pharmacist

## 2019-11-27 ENCOUNTER — Ambulatory Visit (INDEPENDENT_AMBULATORY_CARE_PROVIDER_SITE_OTHER): Payer: Self-pay | Admitting: Pharmacist

## 2019-11-27 ENCOUNTER — Other Ambulatory Visit: Payer: Self-pay

## 2019-11-27 DIAGNOSIS — B182 Chronic viral hepatitis C: Secondary | ICD-10-CM

## 2019-11-27 NOTE — Progress Notes (Signed)
Hep C labs 

## 2019-11-28 NOTE — Progress Notes (Signed)
Patient came to get labs only since I spoke with him on the phone late last week. Will call him when results return to discuss and schedule his end of treatment visit.

## 2019-11-29 LAB — HEPATITIS C RNA QUANTITATIVE
HCV Quantitative Log: <1.18 Log IU/mL
HCV RNA, PCR, QN: <15 IU/mL

## 2019-11-29 LAB — COMPREHENSIVE METABOLIC PANEL
AG Ratio: 1.9 (calc) (ref 1.0–2.5)
ALT: 19 U/L (ref 9–46)
AST: 21 U/L (ref 10–40)
Albumin: 4.6 g/dL (ref 3.6–5.1)
Alkaline phosphatase (APISO): 47 U/L (ref 36–130)
BUN: 18 mg/dL (ref 7–25)
CO2: 29 mmol/L (ref 20–32)
Calcium: 10.4 mg/dL — ABNORMAL HIGH (ref 8.6–10.3)
Chloride: 104 mmol/L (ref 98–110)
Creat: 1.02 mg/dL (ref 0.60–1.35)
Globulin: 2.4 g/dL (calc) (ref 1.9–3.7)
Glucose, Bld: 133 mg/dL — ABNORMAL HIGH (ref 65–99)
Potassium: 4.6 mmol/L (ref 3.5–5.3)
Sodium: 140 mmol/L (ref 135–146)
Total Bilirubin: 0.6 mg/dL (ref 0.2–1.2)
Total Protein: 7 g/dL (ref 6.1–8.1)

## 2019-12-05 ENCOUNTER — Telehealth: Payer: Self-pay

## 2019-12-05 NOTE — Telephone Encounter (Signed)
I spoke with Derrick [name] this afternoon about his lab results. His HCV viral load is now undetectable. I encouraged him to continue taking his medication at the same time every day and avoid missing any doses to give him the best chance of achieving clinical cure of his HCV infection. He had some complaints of feeling run down, nauseous, and dehydrated, especially in the morning, and was concerned the Harvoni was causing this. Harvoni may contribute to him feeling run down, but would not likely cause dehydration without vomiting/diarrhea. I advised him to increase his water intake throughout the day and be proactive about staying hydrated. He should contact the clinic if he has any further issues or adverse effects. He will follow up for his end of treatment visit on 12/26/2019 at 4 pm.   Ellison Carwin, PharmD PGY1 Pharmacy Resident

## 2019-12-17 ENCOUNTER — Other Ambulatory Visit: Payer: Self-pay | Admitting: Pharmacist

## 2019-12-17 DIAGNOSIS — B182 Chronic viral hepatitis C: Secondary | ICD-10-CM

## 2019-12-26 ENCOUNTER — Other Ambulatory Visit: Payer: Self-pay

## 2019-12-26 ENCOUNTER — Ambulatory Visit: Payer: Self-pay | Admitting: Pharmacist

## 2020-01-05 ENCOUNTER — Encounter (HOSPITAL_COMMUNITY): Payer: Self-pay | Admitting: Emergency Medicine

## 2020-01-05 ENCOUNTER — Other Ambulatory Visit: Payer: Self-pay

## 2020-01-05 ENCOUNTER — Emergency Department (HOSPITAL_COMMUNITY)
Admission: EM | Admit: 2020-01-05 | Discharge: 2020-01-07 | Disposition: A | Discharge status: Home / Self Care | Payer: Self-pay | Attending: Emergency Medicine | Admitting: Emergency Medicine

## 2020-01-05 DIAGNOSIS — F332 Major depressive disorder, recurrent severe without psychotic features: Secondary | ICD-10-CM | POA: Diagnosis present

## 2020-01-05 DIAGNOSIS — Z20822 Contact with and (suspected) exposure to covid-19: Secondary | ICD-10-CM | POA: Insufficient documentation

## 2020-01-05 DIAGNOSIS — I10 Essential (primary) hypertension: Secondary | ICD-10-CM | POA: Insufficient documentation

## 2020-01-05 DIAGNOSIS — F17210 Nicotine dependence, cigarettes, uncomplicated: Secondary | ICD-10-CM | POA: Insufficient documentation

## 2020-01-05 DIAGNOSIS — F1424 Cocaine dependence with cocaine-induced mood disorder: Secondary | ICD-10-CM | POA: Diagnosis present

## 2020-01-05 DIAGNOSIS — F909 Attention-deficit hyperactivity disorder, unspecified type: Secondary | ICD-10-CM | POA: Insufficient documentation

## 2020-01-05 DIAGNOSIS — F330 Major depressive disorder, recurrent, mild: Secondary | ICD-10-CM | POA: Insufficient documentation

## 2020-01-05 DIAGNOSIS — R45851 Suicidal ideations: Secondary | ICD-10-CM | POA: Insufficient documentation

## 2020-01-05 DIAGNOSIS — Z79899 Other long term (current) drug therapy: Secondary | ICD-10-CM | POA: Insufficient documentation

## 2020-01-05 DIAGNOSIS — F1910 Other psychoactive substance abuse, uncomplicated: Secondary | ICD-10-CM | POA: Diagnosis present

## 2020-01-05 LAB — CBC
HCT: 49.1 % (ref 39.0–52.0)
Hemoglobin: 17 g/dL (ref 13.0–17.0)
MCH: 30.5 pg (ref 26.0–34.0)
MCHC: 34.6 g/dL (ref 30.0–36.0)
MCV: 88.2 fL (ref 80.0–100.0)
Platelets: 326 10*3/uL (ref 150–400)
RBC: 5.57 MIL/uL (ref 4.22–5.81)
RDW: 12 % (ref 11.5–15.5)
WBC: 13.5 10*3/uL — ABNORMAL HIGH (ref 4.0–10.5)
nRBC: 0 % (ref 0.0–0.2)

## 2020-01-05 LAB — RAPID URINE DRUG SCREEN, HOSP PERFORMED
Amphetamines: POSITIVE — AB
Barbiturates: NOT DETECTED
Benzodiazepines: NOT DETECTED
Cocaine: POSITIVE — AB
Opiates: NOT DETECTED
Tetrahydrocannabinol: NOT DETECTED

## 2020-01-05 NOTE — ED Triage Notes (Signed)
Patient reports worsening SI x1 month with "lots of plans." States recent relapse after ten months clean. Reports using meth and crack x2 days ago.

## 2020-01-05 NOTE — ED Provider Notes (Signed)
[facility] DEPT Provider Note   CSN: [id number] Arrival date & time: 01/05/20  2247     History Chief Complaint  Patient presents with  . Suicidal    Derrick [name] is a 34 y.o. male.  The history is provided by the patient and medical records.    34 year old male with history of anxiety, bipolar disorder, hypertension, substance abuse, presenting to the ED with suicidal ideation.  Patient states the entire last year of his life has been "shit".  He states last July the mother of his child killed herself, he was in jail at the time so could not really do anything about this.  Their child that they shared is currently in foster care and he is not allowed to have any contact with her.  States after he was released from jail he did enter a treatment facility and was clean for about 10 months before relapsing.  States this has been an on-and-off issue over the past few months.  2 days ago he started feeling really down and was overall just contemplating "why am I here".  He states Friday and Saturday he went on a 48-hour bender doing lots of meth and cocaine.  States he was awake for almost 3 days with racing thoughts.  States he turned off his phone and isolated himself, spent most of the time walking around [place] to various bridges and contemplating jumping off them.  He states he has since been back in contact with his girlfriend who is encouraged him to get some help.  He continues to feel suicidal.  He denies any homicidal ideation but states in general he does feel very angry with some impulse control issues.  He states that someone triggered him right he may do something bad but he does not have harmful thoughts against anyone in particular.  He denies any hallucinations.  States he has not really been sleeping lately and has not really been eating.  He does report some ongoing headaches.  He is not currently on any medication.  Reports he was prescribed  various psychiatric medications in the past, all of which gave him adverse side effects without really helping his symptoms.  Past Medical History:  Diagnosis Date  . Anxiety   . Bipolar 1 disorder ([place])   . Headache(784.0)   . Hypertension   . Substance abuse [facility])     Patient Active Problem List   Diagnosis Date Noted  . Chronic hepatitis C without hepatic coma ([place]) 10/01/2019  . Underinsured 09/28/2019  . Cocaine dependence with cocaine-induced mood disorder ([place]) 03/18/2019  . ADHD (attention deficit hyperactivity disorder), combined type 01/18/2015  . Major depressive disorder, recurrent, severe without psychotic behavior ([place]) 01/17/2015  . Polysubstance abuse ([place]) 05/02/2013  . MDD (major depressive disorder) 05/02/2013  . Suicide attempt ([place]) 05/02/2013  . Impulse control disorder, unspecified 01/04/2013  . Unspecified episodic mood disorder 01/04/2013    History reviewed. No pertinent surgical history.     Family History  Problem Relation Age of Onset  . Cancer Mother        Lung    Social History   Tobacco Use  . Smoking status: Current Every Day Smoker    Packs/day: 1.00    Types: Cigarettes  . Smokeless tobacco: Never Used  Substance Use Topics  . Alcohol use: Not Currently  . Drug use: Not Currently    Types: Marijuana, "Crack" cocaine, Other-see comments, Methamphetamines    Comment: [facility] -  94 days clean    Home Medications Prior to Admission medications   Medication Sig Start Date End Date Taking? Authorizing Provider  Ledipasvir-Sofosbuvir (HARVONI) 90-400 MG TABS Take 1 tablet by mouth daily. 10/10/19   Judyann Munson, MD  Multiple Vitamin (MULTIVITAMIN WITH MINERALS) TABS tablet Take 1 tablet by mouth daily. Patient not taking: Reported on 10/01/2019 03/20/19   Jearld Lesch, NP  PRAZOSIN HCL PO Take by mouth.    [provider]  sertraline (ZOLOFT) 50 MG tablet Take 25 mg by mouth daily.    [provider]  Tamsulosin HCl (FLOMAX PO) Take by mouth.    [provider]  thiamine 100 MG tablet Take 1 tablet (100 mg total) by mouth daily. Patient not taking: Reported on 10/01/2019 03/20/19   Jearld Lesch, NP  cloNIDine HCl (KAPVAY) 0.1 MG TB12 ER tablet Take 1 tablet (0.1 mg total) by mouth at bedtime. Patient not taking: Reported on 03/17/2019 01/23/15 03/29/19  Adonis Brook, NP    Allergies    Penicillins  Review of Systems   Review of Systems  Psychiatric/Behavioral: Positive for suicidal ideas.  All other systems reviewed and are negative.   Physical Exam Updated Vital Signs BP 127/82 (BP Location: Left Arm)   Pulse 82   Temp 98.7 F (37.1 C) (Oral)   Resp 16   SpO2 100%   Physical Exam Vitals and nursing note reviewed.  Constitutional:      Appearance: He is well-developed.  HENT:     Head: Normocephalic and atraumatic.  Eyes:     Conjunctiva/sclera: Conjunctivae normal.     Pupils: Pupils are equal, round, and reactive to light.  Cardiovascular:     Rate and Rhythm: Normal rate and regular rhythm.     Heart sounds: Normal heart sounds.  Pulmonary:     Effort: Pulmonary effort is normal.     Breath sounds: Normal breath sounds.  Abdominal:     General: Bowel sounds are normal.     Palpations: Abdomen is soft.  Musculoskeletal:        General: Normal range of motion.     Cervical back: Normal range of motion.  Skin:    General: Skin is warm and dry.  Neurological:     Mental Status: He is alert and oriented to person, place, and time.  Psychiatric:        Attention and Perception: He does not perceive auditory hallucinations.        Mood and Affect: Mood is elated.        Behavior: Behavior is hyperactive.        Thought Content: Thought content includes suicidal ideation. Thought content does not include homicidal ideation. Thought content includes suicidal plan. Thought content does not include homicidal plan.     ED Results / Procedures /  Treatments   Labs (all labs ordered are listed, but only abnormal results are displayed) Labs Reviewed  COMPREHENSIVE METABOLIC PANEL - Abnormal; Notable for the following components:      Result Value   Glucose, Bld 111 (*)    AST 50 (*)    All other components within normal limits  SALICYLATE LEVEL - Abnormal; Notable for the following components:   Salicylate Lvl <7.0 (*)    All other components within normal limits  ACETAMINOPHEN LEVEL - Abnormal; Notable for the following components:   Acetaminophen (Tylenol), Serum <10 (*)    All other components within normal limits  CBC - Abnormal; Notable  for the following components:   WBC 13.5 (*)    All other components within normal limits  RAPID URINE DRUG SCREEN, HOSP PERFORMED - Abnormal; Notable for the following components:   Cocaine POSITIVE (*)    Amphetamines POSITIVE (*)    All other components within normal limits  SARS CORONAVIRUS 2 BY RT PCR (HOSPITAL ORDER, PERFORMED IN [facility])  ETHANOL    EKG None  Radiology No results found. In bed Procedures Procedures (including critical care time)  Medications Ordered in ED Medications - No data to display  ED Course  I have reviewed the triage vital signs and the nursing notes.  Pertinent labs & imaging results that were available during my care of the patient were reviewed by me and considered in my medical decision making (see chart for details).    MDM Rules/Calculators/A&P  34 year old male presenting to the ED with suicidal ideation.  He reports a lot of stress over the past year, notably suicide of his ex girlfriend and mother of his child.  He has also recently relapsed on meth and cocaine, spent most of the weekend walking around [place] contemplating jumping off several bridges.  He denies any physical complaints currently.  He denies any homicidal ideation, no hallucinations.  Denies any alcohol abuse.  Labs here are grossly reassuring.  UDS  is positive for amphetamines and cocaine. Will get TTS consult.  TTS has evaluated, recommends inpatient treatment.  They will seek placement.  Covid test pending.  Patient otherwise medically cleared.  Final Clinical Impression(s) / ED Diagnoses Final diagnoses:  Suicidal ideation    Rx / DC Orders ED Discharge Orders    None       Garlon Hatchet, PA-C 01/06/20 [id number]    Marily Memos, MD 01/06/20 [phone]

## 2020-01-06 ENCOUNTER — Other Ambulatory Visit: Payer: Self-pay

## 2020-01-06 LAB — COMPREHENSIVE METABOLIC PANEL
ALT: 33 U/L (ref 0–44)
AST: 50 U/L — ABNORMAL HIGH (ref 15–41)
Albumin: 4.6 g/dL (ref 3.5–5.0)
Alkaline Phosphatase: 52 U/L (ref 38–126)
Anion gap: 12 (ref 5–15)
BUN: 19 mg/dL (ref 6–20)
CO2: 25 mmol/L (ref 22–32)
Calcium: 9.3 mg/dL (ref 8.9–10.3)
Chloride: 98 mmol/L (ref 98–111)
Creatinine, Ser: 1.14 mg/dL (ref 0.61–1.24)
GFR calc Af Amer: >60 mL/min (ref >60)
GFR calc non Af Amer: >60 mL/min (ref >60)
Glucose, Bld: 111 mg/dL — ABNORMAL HIGH (ref 70–99)
Potassium: 4.1 mmol/L (ref 3.5–5.1)
Sodium: 135 mmol/L (ref 135–145)
Total Bilirubin: 0.9 mg/dL (ref 0.3–1.2)
Total Protein: 8.1 g/dL (ref 6.5–8.1)

## 2020-01-06 LAB — ACETAMINOPHEN LEVEL: Acetaminophen (Tylenol), Serum: <10 ug/mL — ABNORMAL LOW (ref 10–30)

## 2020-01-06 LAB — SALICYLATE LEVEL: Salicylate Lvl: <7 mg/dL — ABNORMAL LOW (ref 7.0–30.0)

## 2020-01-06 LAB — SARS CORONAVIRUS 2 BY RT PCR (HOSPITAL ORDER, PERFORMED IN ~~LOC~~ HOSPITAL LAB): SARS Coronavirus 2: NEGATIVE

## 2020-01-06 LAB — ETHANOL: Alcohol, Ethyl (B): <10 mg/dL (ref <10)

## 2020-01-06 MED ORDER — NICOTINE 21 MG/24HR TD PT24
21.0000 mg | MEDICATED_PATCH | Freq: Every day | TRANSDERMAL | Status: DC
  Administered 2020-01-06: 21 mg via TRANSDERMAL
  Filled 2020-01-06: qty 1

## 2020-01-06 MED ORDER — LIP MEDEX EX OINT
TOPICAL_OINTMENT | Freq: Once | CUTANEOUS | Status: AC
  Filled 2020-01-06: qty 7

## 2020-01-06 MED ORDER — ALUM & MAG HYDROXIDE-SIMETH 200-200-20 MG/5ML PO SUSP: 30.0000 mL | Freq: Four times a day (QID) | ORAL | Status: DC | PRN

## 2020-01-06 MED ORDER — FLUOXETINE HCL 20 MG PO CAPS
20.0000 mg | ORAL_CAPSULE | Freq: Every day | ORAL | Status: DC
  Administered 2020-01-06: 20 mg via ORAL
  Filled 2020-01-06: qty 1

## 2020-01-06 MED ORDER — IBUPROFEN 200 MG PO TABS
600.0000 mg | ORAL_TABLET | Freq: Three times a day (TID) | ORAL | Status: DC | PRN
  Administered 2020-01-06: 600 mg via ORAL
  Filled 2020-01-06: qty 3

## 2020-01-06 MED ORDER — GABAPENTIN 300 MG PO CAPS
300.0000 mg | ORAL_CAPSULE | Freq: Three times a day (TID) | ORAL | Status: DC
  Administered 2020-01-06: 300 mg via ORAL
  Filled 2020-01-06: qty 1

## 2020-01-06 MED ORDER — ONDANSETRON HCL 4 MG PO TABS: 4.0000 mg | ORAL_TABLET | Freq: Three times a day (TID) | ORAL | Status: DC | PRN

## 2020-01-06 MED ORDER — ZOLPIDEM TARTRATE 5 MG PO TABS
5.0000 mg | ORAL_TABLET | Freq: Every evening | ORAL | Status: DC | PRN
  Administered 2020-01-06: 5 mg via ORAL
  Filled 2020-01-06: qty 1

## 2020-01-06 NOTE — ED Notes (Signed)
Pt moved from ED recliner chair to ED Stretcher

## 2020-01-06 NOTE — ED Notes (Signed)
No sitter at this time.

## 2020-01-06 NOTE — ED Notes (Addendum)
TTS assessment completed. Jason Berry, NP, patient meets inpatient criteria. TTS to secure placement. 

## 2020-01-06 NOTE — BH Assessment (Signed)
Tele Assessment Note   Patient Name: Derrick [name] MRN: [id number] Referring Physician: Quincy Carnes, PA Location of Patient: [facility] Location of Provider: [facility]  ULIS KAPS is an 34 y.o. male presenting with SI with plan to jump over overpass or run into traffic. Patient reported onset SI has been for 1 month with worsening in past 1 week. Patient reported his friend and girlfriend brought him in to ED. Patient reported turning off his phone and isolating himself, walking around [place] to various bridges and contemplating jumping off them. Patient spoke to girlfriend who encouraged him to get some help. Patient reported using cocaine and meth, stating "I only used cocaine and 3 hits of meth", however patient reported to EDP that Friday and Saturday he went on a 48-hour bender doing lots of meth and cocaine. Patient reported last year was "shit". Patient reported the mother of his 30 year old daughter committed suicide while he was incarcerated. Patient reported being pressured into giving up his parental rights, as he felt he was in an impossible situation. Patients daughter is currently in foster care and patient is not allowed to see daughter. Patient reported worsening symptoms. Patient reported between awake for approx 3 days with racing thoughts. Patient stated, "my communication is not always good, I always expect the worst, I get pissed the fuck off cause I woke up". Patient reported emotional pain, always being mad irritated, "getting mad at the [place]". Patient stated, "nothing makes me happy". Patient reported having a self-defeated attitude. Patient was depressed and cooperative during assessment.   Patient reported history of suicide attempt in 02/2019, attempted overdose on Xanax and trying to stab self in pain. Patient reported childhood traumas, did not go into detail. Patient is not receiving any outpatient mental health services. Patient is  currently residing with father and grandfather. Patient has a 77 year old daughter that he relinquished his parental rights on 09/2019  Patient reported being released from jail he did enter a treatment facility and was clean for about 10 months before relapsing.  States this has been an on-and-off issue over the past few months.  2 days ago he started feeling really down and was overall just contemplating "why am I here".    Diagnosis: Major depressive disorder  Past Medical History:  Past Medical History:  Diagnosis Date  . Anxiety   . Bipolar 1 disorder ([place])   . Headache(784.0)   . Hypertension   . Substance abuse ([place])     History reviewed. No pertinent surgical history.  Family History:  Family History  Problem Relation Age of Onset  . Cancer Mother        Lung    Social History:  reports that he has been smoking cigarettes. He has been smoking about 1.00 pack per day. He has never used smokeless tobacco. He reports previous alcohol use. He reports previous drug use. Drugs: Marijuana, "Crack" cocaine, Other-see comments, and Methamphetamines.  Additional Social History:  Alcohol / Drug Use Pain Medications: see MAR Prescriptions: see MAR Over the Counter: see MAR  CIWA: CIWA-Ar BP: 127/82 Pulse Rate: 82 COWS:    Allergies:  Allergies  Allergen Reactions  . Penicillins Nausea Only    Did it involve swelling of the face/tongue/throat, SOB, or low BP? unknown Did it involve sudden or severe rash/hives, skin peeling, or any reaction on the inside of your mouth or nose? yes Did you need to seek medical attention at a hospital or  doctor's office? yes When did it last happen?03/16/2019 If all above answers are "NO", may proceed with cephalosporin use.     Home Medications: (Not in a hospital admission)   OB/GYN Status:  No LMP for male patient.  General Assessment Data Location of Assessment: [facility] TTS Assessment: In system Is this a Tele or Face-to-Face  Assessment?: Tele Assessment Is this an Initial Assessment or a Re-assessment for this encounter?: Initial Assessment Patient Accompanied by:: N/A Language Other than English: No Living Arrangements: (family home) What gender do you identify as?: Male Marital status: Single Living Arrangements: Parent Can pt return to current living arrangement?: Yes Admission Status: Voluntary Is patient capable of signing voluntary admission?: Yes Referral Source: Self/Family/Friend  Crisis Care Plan Living Arrangements: Parent Legal Guardian: (self) Name of Psychiatrist: (none) Name of Therapist: (none)  Education Status Is patient currently in school?: No Is the patient employed, unemployed or receiving disability?: Employed  Risk to self with the past 6 months Suicidal Ideation: Yes-Currently Present Has patient been a risk to self within the past 6 months prior to admission? : Yes Suicidal Intent: Yes-Currently Present Has patient had any suicidal intent within the past 6 months prior to admission? : Yes Is patient at risk for suicide?: Yes Suicidal Plan?: Yes-Currently Present Has patient had any suicidal plan within the past 6 months prior to admission? : Yes Specify Current Suicidal Plan: ("multiple, jump off overpass, run into traffic") Access to Means: Yes Specify Access to Suicidal Means: (jump off bridge or run into traffic) What has been your use of drugs/alcohol within the last 12 months?: ("meth and cocaine") Previous Attempts/Gestures: Yes How many times?: (1x) Other Self Harm Risks: (none) Triggers for Past Attempts: (grief loss issues) Intentional Self Injurious Behavior: ("hx cutting and beating self up") Family Suicide History: (exgirlfriend, baby's mother, overdosed on drugs 1 yr ago) Recent stressful life event(s): Loss (Comment), Trauma (Comment)(grieving gfriends death and relinguishing rights of daughter) Persecutory voices/beliefs?: No Depression: Yes Depression  Symptoms: Insomnia, Tearfulness, Isolating, Fatigue, Guilt, Feeling worthless/self pity, Loss of interest in usual pleasures, Feeling angry/irritable Substance abuse history and/or treatment for substance abuse?: No Suicide prevention information given to non-admitted patients: Not applicable  Risk to Others within the past 6 months Homicidal Ideation: No Does patient have any lifetime risk of violence toward others beyond the six months prior to admission? : No Thoughts of Harm to Others: No Current Homicidal Intent: No Current Homicidal Plan: No Access to Homicidal Means: No Identified Victim: (n/a) History of harm to others?: No Assessment of Violence: None Noted Violent Behavior Description: (none reported) Does patient have access to weapons?: No Criminal Charges Pending?: No Does patient have a court date: No Is patient on probation?: No  Psychosis Hallucinations: None noted Delusions: None noted  Mental Status Report Appearance/Hygiene: Unremarkable Eye Contact: Fair Motor Activity: Freedom of movement, Restlessness Speech: Logical/coherent Level of Consciousness: Alert, Restless, Irritable Mood: Depressed, Anxious, Irritable Affect: Angry, Appropriate to circumstance, Depressed, Irritable Anxiety Level: Moderate Thought Processes: Coherent, Relevant Judgement: Impaired Orientation: Person, Place, Time, Situation Obsessive Compulsive Thoughts/Behaviors: None  Cognitive Functioning Concentration: Fair Memory: Recent Intact Is patient IDD: No Insight: Poor Impulse Control: Poor Appetite: Poor Have you had any weight changes? : No Change Sleep: Decreased Total Hours of Sleep: (1.5) Vegetative Symptoms: Unable to Assess  ADLScreening [facility] Assessment Services) Patient's cognitive ability adequate to safely complete daily activities?: Yes Patient able to express need for assistance with ADLs?: Yes Independently performs ADLs?: Yes (appropriate for  developmental  age)  Prior Inpatient Therapy Prior Inpatient Therapy: Yes Prior Therapy Dates: (02/2019) Prior Therapy Facilty/Provider(s): ([facility]) Reason for Treatment: (attempted overdose)  Prior Outpatient Therapy Prior Outpatient Therapy: No Does patient have an ACCT team?: No Does patient have Intensive In-House Services?  : No Does patient have [facility] services? : No Does patient have P4CC services?: No  ADL Screening (condition at time of admission) Patient's cognitive ability adequate to safely complete daily activities?: Yes Patient able to express need for assistance with ADLs?: Yes Independently performs ADLs?: Yes (appropriate for developmental age)  Advance Directives (For Healthcare) Does Patient Have a Medical Advance Directive?: No   Disposition:  Disposition Initial Assessment Completed for this Encounter: Yes  Nira Conn, NP, patient meets inpatient criteria. TTS to secure placement.   This service was provided via telemedicine using a 2-way, interactive audio and video technology.  Names of all persons participating in this telemedicine service and their role in this encounter. Name: Derrick [name] Role: Patient  Name: Al Corpus Role: TTS Clinician  Name:  Role:   Name:  Role:     Burnetta Sabin 01/06/2020 2:25 AM

## 2020-01-06 NOTE — Progress Notes (Signed)
Recommend observation, roommate wants him to get help before he will be allowed to return.  Nanine Means, PMHNP

## 2020-01-07 ENCOUNTER — Encounter (HOSPITAL_COMMUNITY): Payer: Self-pay | Admitting: Registered Nurse

## 2020-01-07 MED ORDER — GABAPENTIN 300 MG PO CAPS: 300.0000 mg | ORAL_CAPSULE | Freq: Three times a day (TID) | ORAL | 0 refills | Status: DC

## 2020-01-07 MED ORDER — FLUOXETINE HCL 20 MG PO CAPS: 20.0000 mg | ORAL_CAPSULE | Freq: Every day | ORAL | 0 refills | Status: DC

## 2020-01-07 NOTE — ED Notes (Signed)
Pt incredibly nervous/anxious about going to another facility. Told pt that peer support just spoke to him and will get back with him. Pt verbalized understanding, but wanted peer support back. RN notified. Will continue to monitor pt.

## 2020-01-07 NOTE — ED Notes (Signed)
Pt.s breakfast has arrived, pt sitting up and eating breakfast at this time. Will continue to monitor pt.

## 2020-01-07 NOTE — ED Notes (Signed)
Pt asked for cheese and ice water. Given to pt as a snack. Will continue to monitor pt

## 2020-01-07 NOTE — ED Notes (Signed)
Pt walked to the bathroom, cooperative entire time. Will continue to monitor pt.

## 2020-01-07 NOTE — ED Notes (Signed)
Pt went to the tcu to take a shower, walked to the room. Cooperative entire time. Will continue to monitor pt.

## 2020-01-07 NOTE — Discharge Instructions (Signed)
To help you maintain a sober lifestyle, a substance abuse treatment program may be beneficial to you.  Contact one of the following facilities at your earliest opportunity to ask about enrolling:  RESIDENTIAL PROGRAMS:       [facility]      [address]      [place], [place] [address]      [phone]       [facility]      [address]      [place], [place] [address]      [phone]       [facility]      [address]      [place], [place] [address]      [phone]  OUTPATIENT PROGRAMS:       [facility]      [address]      [place], [place] [address]      [phone]       New patients are seen at their walk-in clinic.  Walk-in hours are Monday - Friday from 8:30 am - 12:00 pm, and from 1:00 pm - 2:30 pm.  Walk-in patients are seen on a first come, first served basis, so try to arrive as early as possible for the best chance of being seen the same day. 

## 2020-01-07 NOTE — BH Assessment (Signed)
[facility] Assessment Progress Note  Per Archana Kumar, MD, this pt does not require psychiatric hospitalization at this time.  Pt is to be discharged from [facility] with referral information for area substance abuse treatment providers. This has been included in pt's discharge instructions.  Pt would also benefit from seeing Peer Support Specialists, and a peer support consult has been ordered for pt.  Pt's nurse has been notified.  Thomas Hughes, MA Triage Specialist [phone]     

## 2020-01-07 NOTE — ED Notes (Signed)
Pt currently talking on the phone.

## 2020-01-07 NOTE — ED Notes (Signed)
Pt currently talking to TTS 

## 2020-01-07 NOTE — [facility] Suicide Risk Assessment (Cosign Needed)
Suicide Risk Assessment  Discharge Assessment   [facility] Discharge Suicide Risk Assessment   Principal Problem: Cocaine dependence with cocaine-induced mood disorder [facility]) Discharge Diagnoses: Principal Problem:   Cocaine dependence with cocaine-induced mood disorder (HCC) Active Problems:   Polysubstance abuse (HCC)   Major depressive disorder, recurrent, severe without psychotic behavior (HCC)   Total Time spent with patient: 30 minutes  Musculoskeletal: Strength & Muscle Tone: within normal limits Gait & Station: normal Patient leans: N/A  Psychiatric Specialty Exam:   Blood pressure 101/63, pulse 61, temperature 98.1 F (36.7 C), temperature source Oral, resp. rate 16, height 6' (1.829 m), weight 86.2 kg, SpO2 96 %.Body mass index is 25.77 kg/m.  General Appearance: Casual  Eye Contact::  Good  Speech:  Blocked and Normal Rate409  Volume:  Normal  Mood:  "Fine  Affect:  Appropriate and Congruent  Thought Process:  Coherent, Goal Directed and Descriptions of Associations: Intact  Orientation:  Full (Time, Place, and Person)  Thought Content:  Logical  Suicidal Thoughts:  No  Homicidal Thoughts:  No  Memory:  Immediate;   Good Recent;   Good  Judgement:  Intact  Insight:  Present  Psychomotor Activity:  Normal  Concentration:  Good  Recall:  Good  Fund of Knowledge:Good  Language: Good  Akathisia:  No  Handed:  Right  AIMS (if indicated):     Assets:  Communication Skills Desire for Improvement Housing Physical Health Social Support  Sleep:     Cognition: WNL  ADL's:  Intact   Mental Status Per Nursing Assessment::   On Admission:    Derrick [name], 34 y.o., male patient seen via tele psych by this provider, Dr. Lucianne Muss; and chart reviewed on 01/07/20.  On evaluation Derrick [name] reports he is feeling better this morning.  Patient states that he is seeking help for substance use.  Patient states that he wants to get his life together.  States he was kicked  out of the [facility] but can go back once he has received services for substance use.  Patient states that he wants to get into a rehab facility.  Informed Peer Support consult would be order to assist with rehab facility referrals During evaluation Derrick [name] is alert/oriented x 4; calm/cooperative; and mood is congruent with affect.  He does not appear to be responding to internal/external stimuli or delusional thoughts.  Patient denies suicidal/self-harm/homicidal ideation, psychosis, and paranoia.  Patient answered question appropriately.     Demographic Factors:  Male and Caucasian  Loss Factors: NA  Historical Factors: Impulsivity  Risk Reduction Factors:   Religious beliefs about death and Living with another person, especially a relative  Continued Clinical Symptoms:  Alcohol/Substance Abuse/Dependencies  Cognitive Features That Contribute To Risk:  None    Suicide Risk:  Minimal: No identifiable suicidal ideation.  Patients presenting with no risk factors but with morbid ruminations; may be classified as minimal risk based on the severity of the depressive symptoms    Plan Of Care/Follow-up recommendations:  Activity:  As tolerated Diet:  Heart healthy     Discharge Instructions     To help you maintain a sober lifestyle, a substance abuse treatment program may be beneficial to you.  Contact one of the following facilities at your earliest opportunity to ask about enrolling:  RESIDENTIAL PROGRAMS:       [facility]      [address] [place], [place] [address]      [phone]       [facility]      [address] [place], [place] [address]      [phone]       [facility]      [place], [place] [address]      [phone]  OUTPATIENT PROGRAMS:       [facility], [place] [address]      [phone]      New patients are seen at  their walk-in clinic.  Walk-in hours are Monday - Friday from 8:30 am - 12:00 pm, and from 1:00 pm - 2:30 pm.  Walk-in patients are seen on a first come, first served basis, so try to arrive as early as possible for the best chance of being seen the same day.      Disposition:  Psychiatrically cleared No evidence of imminent risk to self or others at present.   Patient does not meet criteria for psychiatric inpatient admission. Supportive therapy provided about ongoing stressors. Discussed crisis plan, support from social network, calling 911, coming to the Emergency Department, and calling Suicide Hotline.  Shuvon Rankin, NP 01/07/2020, 3:03 PM

## 2020-01-07 NOTE — ED Notes (Signed)
Pts lunch has arrived, pt stated they were not hungry but was notified that their food was on their table when ready.

## 2020-01-07 NOTE — Patient Outreach (Signed)
ED Peer Support Specialist Patient Intake (Complete at intake & 30-60 Day Follow-up)  Name: Derrick [name]  MRN: [id number]  Age: 34 y.o.   Date of Admission: 01/07/2020  Intake: Initial Comments:      Primary Reason Admitted: No Disposition Selected Suicidal ideation   Lab values: Alcohol/ETOH:   Positive UDS? No Amphetamines: No Barbiturates: No Benzodiazepines: No Cocaine: Yes Opiates: No Cannabinoids: Yes  Demographic information: Gender: Male Ethnicity: White Marital Status: Single Insurance Status: Uninsured/Self-pay Ecologist (Work Neurosurgeon, Physicist, medical, etc.: No Lives with: Alone Living situation: Homeless  Reported Patient History: Patient reported health conditions: Anxiety disorders, Depression Patient aware of HIV and hepatitis status: No  In past year, has patient visited ED for any reason? No  Number of ED visits:    Reason(s) for visit:    In past year, has patient been hospitalized for any reason? No  Number of hospitalizations:    Reason(s) for hospitalization:    In past year, has patient been arrested? No  Number of arrests:    Reason(s) for arrest:    In past year, has patient been incarcerated? No  Number of incarcerations:    Reason(s) for incarceration:    In past year, has patient received medication-assisted treatment? No  In past year, patient received the following treatments:    In past year, has patient received any harm reduction services? No  Did this include any of the following?    In past year, has patient received care from a mental health provider for diagnosis other than SUD? Yes  In past year, is this first time patient has overdosed? No  Number of past overdoses:    In past year, is this first time patient has been hospitalized for an overdose?    Number of hospitalizations for overdose(s):    Is patient currently receiving treatment for a mental health diagnosis?  No  Patient reports experiencing difficulty participating in SUD treatment: No    Most important reason(s) for this difficulty?    Has patient received prior services for treatment? Yes  In past, patient has received services from following agencies:    Plan of Care:  Suggested follow up at these agencies/treatment centers: Other (comment)(Pt will be going [facility] in [place])  Other information: CPSS met with Pt an was able to discuss a few options that Pt may gain a little more of an understanding of the services provided. Pt contacted [facility] an was made aware that there were no beds available. CPSS contacted [facility] in [place] made Pt aware that he could get a bed there today. CPSS mentioned that transportation can be provided straight to facility. CPSS will contact transportation for services.

## 2020-02-03 ENCOUNTER — Other Ambulatory Visit: Payer: Self-pay

## 2020-02-03 ENCOUNTER — Emergency Department (HOSPITAL_COMMUNITY)
Admission: EM | Admit: 2020-02-03 | Discharge: 2020-02-04 | Disposition: A | Discharge status: Home / Self Care | Payer: Self-pay | Attending: Emergency Medicine | Admitting: Emergency Medicine

## 2020-02-03 ENCOUNTER — Encounter (HOSPITAL_COMMUNITY): Payer: Self-pay | Admitting: Emergency Medicine

## 2020-02-03 DIAGNOSIS — R45851 Suicidal ideations: Secondary | ICD-10-CM

## 2020-02-03 DIAGNOSIS — F1420 Cocaine dependence, uncomplicated: Secondary | ICD-10-CM | POA: Insufficient documentation

## 2020-02-03 DIAGNOSIS — Z20822 Contact with and (suspected) exposure to covid-19: Secondary | ICD-10-CM | POA: Insufficient documentation

## 2020-02-03 DIAGNOSIS — Z79899 Other long term (current) drug therapy: Secondary | ICD-10-CM | POA: Insufficient documentation

## 2020-02-03 DIAGNOSIS — F1020 Alcohol dependence, uncomplicated: Secondary | ICD-10-CM | POA: Insufficient documentation

## 2020-02-03 DIAGNOSIS — Z590 Homelessness: Secondary | ICD-10-CM | POA: Insufficient documentation

## 2020-02-03 DIAGNOSIS — F17210 Nicotine dependence, cigarettes, uncomplicated: Secondary | ICD-10-CM | POA: Insufficient documentation

## 2020-02-03 DIAGNOSIS — F332 Major depressive disorder, recurrent severe without psychotic features: Secondary | ICD-10-CM | POA: Diagnosis present

## 2020-02-03 DIAGNOSIS — I10 Essential (primary) hypertension: Secondary | ICD-10-CM | POA: Insufficient documentation

## 2020-02-03 LAB — CBC
HCT: 44.2 % (ref 39.0–52.0)
Hemoglobin: 15 g/dL (ref 13.0–17.0)
MCH: 30.5 pg (ref 26.0–34.0)
MCHC: 33.9 g/dL (ref 30.0–36.0)
MCV: 90 fL (ref 80.0–100.0)
Platelets: 315 10*3/uL (ref 150–400)
RBC: 4.91 MIL/uL (ref 4.22–5.81)
RDW: 12.5 % (ref 11.5–15.5)
WBC: 11.6 10*3/uL — ABNORMAL HIGH (ref 4.0–10.5)
nRBC: 0 % (ref 0.0–0.2)

## 2020-02-03 LAB — COMPREHENSIVE METABOLIC PANEL
ALT: 26 U/L (ref 0–44)
AST: 28 U/L (ref 15–41)
Albumin: 3.9 g/dL (ref 3.5–5.0)
Alkaline Phosphatase: 60 U/L (ref 38–126)
Anion gap: 11 (ref 5–15)
BUN: 14 mg/dL (ref 6–20)
CO2: 23 mmol/L (ref 22–32)
Calcium: 9.2 mg/dL (ref 8.9–10.3)
Chloride: 105 mmol/L (ref 98–111)
Creatinine, Ser: 1 mg/dL (ref 0.61–1.24)
GFR calc Af Amer: >60 mL/min (ref >60)
GFR calc non Af Amer: >60 mL/min (ref >60)
Glucose, Bld: 164 mg/dL — ABNORMAL HIGH (ref 70–99)
Potassium: 3.1 mmol/L — ABNORMAL LOW (ref 3.5–5.1)
Sodium: 139 mmol/L (ref 135–145)
Total Bilirubin: 0.6 mg/dL (ref 0.3–1.2)
Total Protein: 6.7 g/dL (ref 6.5–8.1)

## 2020-02-03 LAB — ACETAMINOPHEN LEVEL: Acetaminophen (Tylenol), Serum: <10 ug/mL — ABNORMAL LOW (ref 10–30)

## 2020-02-03 LAB — SALICYLATE LEVEL: Salicylate Lvl: <7 mg/dL — ABNORMAL LOW (ref 7.0–30.0)

## 2020-02-03 LAB — ETHANOL: Alcohol, Ethyl (B): <10 mg/dL (ref <10)

## 2020-02-03 MED ORDER — POTASSIUM CHLORIDE CRYS ER 20 MEQ PO TBCR
20.0000 meq | EXTENDED_RELEASE_TABLET | Freq: Once | ORAL | Status: AC
  Administered 2020-02-03: 20 meq via ORAL
  Filled 2020-02-03: qty 1

## 2020-02-03 NOTE — ED Provider Notes (Signed)
[facility] EMERGENCY DEPARTMENT Provider Note   CSN: [id number] Arrival date & time: 02/03/20  1827     History Chief Complaint  Patient presents with  . Suicidal    Derrick [name] is a 34 y.o. male.  Patient is a 34 year old male with a history of depression, polysubstance abuse with cocaine and alcohol and marijuana, impulse control disorder and prior history of self-mutilation who is presenting today with suicidal thoughts.  Patient states that he has been hospitalized in the past for suicidal thoughts and most recently was living at [facility] but was kicked out because he was behind on payment.  He has been on the street since Sunday and states he has used cocaine and alcohol and has been walking a lot and states he is under a lot of stress and deals with a lot of anger due to his girlfriend killing herself last July and losing access to his daughter.  Patient reports that he was standing at the intersection watching the cars in for several days now his consider just jumping out in front of the car because it would be easier than continuing his life.  [facility] and is supposed to start a 30-day program on Tuesday but states he was not sure he would make it that long.  He does not take any medications regularly.  He denies any headache, chest pain, shortness of breath, abdominal pain, nausea or vomiting but does complain of pain on the bottom of his feet from blisters from walking.  Patient reports he has not eaten for several days.  He has not been vaccinated against Covid.  Patient does not have access to weapons.  The history is provided by the patient.       Past Medical History:  Diagnosis Date  . Anxiety   . Bipolar 1 disorder ([place])   . Headache(784.0)   . Hypertension   . Substance abuse [facility])     Patient Active Problem List   Diagnosis Date Noted  . Chronic hepatitis C without hepatic coma ([place]) 10/01/2019  . Underinsured  09/28/2019  . Cocaine dependence with cocaine-induced mood disorder ([place]) 03/18/2019  . ADHD (attention deficit hyperactivity disorder), combined type 01/18/2015  . Major depressive disorder, recurrent, severe without psychotic behavior ([place]) 01/17/2015  . Polysubstance abuse ([place]) 05/02/2013  . MDD (major depressive disorder) 05/02/2013  . Suicide attempt ([place]) 05/02/2013  . Impulse control disorder, unspecified 01/04/2013  . Unspecified episodic mood disorder 01/04/2013    History reviewed. No pertinent surgical history.     Family History  Problem Relation Age of Onset  . Cancer Mother        Lung    Social History   Tobacco Use  . Smoking status: Current Every Day Smoker    Packs/day: 1.00    Types: Cigarettes  . Smokeless tobacco: Never Used  Substance Use Topics  . Alcohol use: Not Currently  . Drug use: Not Currently    Types: Marijuana, "Crack" cocaine, Other-see comments, Methamphetamines    Comment: [facility] - 94 days clean    Home Medications Prior to Admission medications   Medication Sig Start Date End Date Taking? Authorizing Provider  FLUoxetine (PROZAC) 20 MG capsule Take 1 capsule (20 mg total) by mouth daily. 01/07/20   Rankin, Shuvon B, NP  gabapentin (NEURONTIN) 300 MG capsule Take 1 capsule (300 mg total) by mouth 3 (three) times daily. 01/07/20   Rankin, Mercy Moore, NP  naproxen sodium (ALEVE) 220 MG tablet Take 440 mg by mouth 2 (two) times daily as needed (pain).    [provider]  cloNIDine HCl (KAPVAY) 0.1 MG TB12 ER tablet Take 1 tablet (0.1 mg total) by mouth at bedtime. Patient not taking: Reported on 03/17/2019 01/23/15 03/29/19  Adonis Brook, NP    Allergies    Penicillins  Review of Systems   Review of Systems  All other systems reviewed and are negative.   Physical Exam Updated Vital Signs BP 121/87 (BP Location: Left Arm)   Pulse 76   Temp 98.2 F (36.8 C) (Oral)   Resp 18   SpO2 99%   Physical  Exam Vitals and nursing note reviewed.  Constitutional:      General: He is not in acute distress.    Appearance: Normal appearance. He is well-developed and normal weight.  HENT:     Head: Normocephalic and atraumatic.  Eyes:     Conjunctiva/sclera: Conjunctivae normal.     Pupils: Pupils are equal, round, and reactive to light.  Cardiovascular:     Rate and Rhythm: Normal rate and regular rhythm.     Heart sounds: No murmur heard.   Pulmonary:     Effort: Pulmonary effort is normal. No respiratory distress.     Breath sounds: Normal breath sounds. No wheezing or rales.  Abdominal:     General: There is no distension.     Palpations: Abdomen is soft.     Tenderness: There is no abdominal tenderness. There is no guarding or rebound.  Musculoskeletal:        General: No tenderness. Normal range of motion.     Cervical back: Normal range of motion and neck supple.  Skin:    General: Skin is warm and dry.     Findings: No erythema or rash.  Neurological:     Mental Status: He is alert and oriented to person, place, and time.  Psychiatric:        Mood and Affect: Affect is flat.        Speech: Speech normal.        Behavior: Behavior is cooperative.        Thought Content: Thought content includes suicidal ideation. Thought content includes suicidal plan.     Comments: Poor eye contact     ED Results / Procedures / Treatments   Labs (all labs ordered are listed, but only abnormal results are displayed) Labs Reviewed  COMPREHENSIVE METABOLIC PANEL - Abnormal; Notable for the following components:      Result Value   Potassium 3.1 (*)    Glucose, Bld 164 (*)    All other components within normal limits  SALICYLATE LEVEL - Abnormal; Notable for the following components:   Salicylate Lvl <7.0 (*)    All other components within normal limits  ACETAMINOPHEN LEVEL - Abnormal; Notable for the following components:   Acetaminophen (Tylenol), Serum <10 (*)    All other  components within normal limits  CBC - Abnormal; Notable for the following components:   WBC 11.6 (*)    All other components within normal limits  SARS CORONAVIRUS 2 BY RT PCR (HOSPITAL ORDER, PERFORMED IN [facility])  ETHANOL  RAPID URINE DRUG SCREEN, HOSP PERFORMED    EKG None  Radiology No results found.  Procedures Procedures (including critical care time)  Medications Ordered in ED Medications  potassium chloride SA (KLOR-CON) CR tablet 20 mEq (has no administration in time range)  ED Course  I have reviewed the triage vital signs and the nursing notes.  Pertinent labs & imaging results that were available during my care of the patient were reviewed by me and considered in my medical decision making (see chart for details).    MDM Rules/Calculators/A&P                          Patient presenting today with suicidal thoughts coming from recently losing his housing and increased stress in his life.  He wants to jump in front of traffic.  Patient is medically clear at this time.  Labs are within normal limits except for mild hypokalemia 3.1 and patient was replaced orally.  CBC without acute findings, alcohol, salicylate and acetaminophen levels are within normal limits.  Patient has not actively attempted to hurt himself.  Will have TTS evaluate the patient and he was placed in psych hold.  Final Clinical Impression(s) / ED Diagnoses Final diagnoses:  Suicidal ideation    Rx / DC Orders ED Discharge Orders    None       Gwyneth Sprout, MD 02/03/20 2157

## 2020-02-03 NOTE — ED Notes (Addendum)
Belongings bagged and labeled.  Placed in [place] [address].  Security wanded pt.  Pt states he is unable to remove piercing to R side of face.  He removed all others.

## 2020-02-03 NOTE — ED Triage Notes (Signed)
Pt states he is suicidal.  States he has been standing on a bridge thinking about jumping.  States his girlfriend committed suicide and he had to give up rights to his daughter.  States he is scheduled to go to [facility] on Tuesday but needs somewhere to go until then.  States he is homeless.  C/o blisters on bottom of feet.  Last ETOH and last crack use yesterday.

## 2020-02-04 DIAGNOSIS — F332 Major depressive disorder, recurrent severe without psychotic features: Secondary | ICD-10-CM

## 2020-02-04 LAB — SARS CORONAVIRUS 2 BY RT PCR (HOSPITAL ORDER, PERFORMED IN ~~LOC~~ HOSPITAL LAB): SARS Coronavirus 2: NEGATIVE

## 2020-02-04 NOTE — ED Provider Notes (Signed)
Per behavioral health the patient is appropriate for discharge with outpatient behavioral health follow-up. Patient is resting, in no distress, is hemodynamically unremarkable.   Gerhard Munch, MD 02/04/20 1217

## 2020-02-04 NOTE — ED Notes (Signed)
Lunch Tray Ordered @ 1043. 

## 2020-02-04 NOTE — ED Notes (Signed)
BREAKFAST ORDERED  

## 2020-02-04 NOTE — Consult Note (Signed)
[facility] Tele-psych consult note  02/04/2020 11:19 AM Derrick [name]  MRN:  [id number] Principal Problem: Major depressive disorder, recurrent, severe without psychotic behavior [facility]) Discharge Diagnoses: Principal Problem:   Major depressive disorder, recurrent, severe without psychotic behavior (HCC)   Subjective: Derrick [name], 34 y.o., male patient seen face to face by this provider; chart reviewed and consulted with Dr.Kumar on 02/04/20.  On evaluation Derrick [name] reports that "I could not pay the money for the [facility]. So they kicked me out. I ended up on the streets. I used drugs. Then I became suicidal and considered jumping from a bridge. But I called a friend to take me here. I have already set up treatment at [facility]. I am supposed to be there at 07:45 am tomorrow morning. There are some calls that I need to make to get all my stuff. I am feeling better now but I was in a difficult position when I was feeling that way. I'm not thinking of hurting myself now. Just need to get more help."  During evaluation Derrick [name] is sitting on the bed; he is alert/oriented x 4; calm/cooperative; and mood congruent with affect.  Patient is speaking in a clear tone at moderate volume, and normal pace; with good eye contact.  His thought process is coherent and relevant; There is no indication that he is currently responding to internal/external stimuli or experiencing delusional thought content.  Patient denies suicidal/self-harm/homicidal ideation, psychosis, and paranoia.  Patient has remained calm throughout assessment and has answered questions appropriately.   Recommendation: Discharge patient home with plan for him to received treatment at [facility].   Disposition:  Patient psychiatrically cleared  No evidence of imminent risk to self or others at present.   Patient does not meet criteria for psychiatric inpatient admission.  Fransisca Kaufmann PMHNP-C  Total Time  spent with patient: 20 minutes  Past Psychiatric History:   Past Medical History:  Past Medical History:  Diagnosis Date  . Anxiety   . Bipolar 1 disorder (HCC)   . Headache(784.0)   . Hypertension   . Substance abuse (HCC)    History reviewed. No pertinent surgical history. Family History:  Family History  Problem Relation Age of Onset  . Cancer Mother        Lung   Family Psychiatric  History: None per chart review.  Social History:  Social History   Substance and Sexual Activity  Alcohol Use Not Currently     Social History   Substance and Sexual Activity  Drug Use Not Currently  . Types: Marijuana, "Crack" cocaine, Other-see comments, Methamphetamines   Comment: [facility] - 94 days clean    Social History   Socioeconomic History  . Marital status: Single    Spouse name: Not on file  . Number of children: Not on file  . Years of education: Not on file  . Highest education level: Not on file  Occupational History  . Not on file  Tobacco Use  . Smoking status: Current Every Day Smoker    Packs/day: 1.00    Types: Cigarettes  . Smokeless tobacco: Never Used  Substance and Sexual Activity  . Alcohol use: Not Currently  . Drug use: Not Currently    Types: Marijuana, "Crack" cocaine, Other-see comments, Methamphetamines    Comment: [facility] - 94 days clean  . Sexual activity: Not on file  Other Topics Concern  . Not on file  Social History Narrative  .  Not on file   Social Determinants of Health   Financial Resource Strain:   . Difficulty of Paying Living Expenses:   Food Insecurity:   . Worried About Programme researcher, broadcasting/film/video in the Last Year:   . Barista in the Last Year:   Transportation Needs:   . Freight forwarder (Medical):   Marland Kitchen Lack of Transportation (Non-Medical):   Physical Activity:   . Days of Exercise per Week:   . Minutes of Exercise per Session:   Stress:   . Feeling of Stress :   Social Connections:    . Frequency of Communication with Friends and Family:   . Frequency of Social Gatherings with Friends and Family:   . Attends Religious Services:   . Active Member of Clubs or Organizations:   . Attends Banker Meetings:   Marland Kitchen Marital Status:     Has this patient used any form of tobacco in the last 30 days? (Cigarettes, Smokeless Tobacco, Cigars, and/or Pipes) A prescription for an FDA-approved tobacco cessation medication was offered at discharge and the patient refused  Current Medications: No current facility-administered medications for this encounter.   Current Outpatient Medications  Medication Sig Dispense Refill  . FLUoxetine (PROZAC) 20 MG capsule Take 1 capsule (20 mg total) by mouth daily. (Patient not taking: Reported on 02/03/2020) 30 capsule 0  . gabapentin (NEURONTIN) 300 MG capsule Take 1 capsule (300 mg total) by mouth 3 (three) times daily. (Patient not taking: Reported on 02/03/2020) 60 capsule 0   PTA Medications: (Not in a hospital admission)   Musculoskeletal: Unable to assess via camera Psychiatric Specialty Exam: Physical Exam  Nursing note and vitals reviewed. Constitutional: He appears well-developed.  Neurological: He is alert.  Psychiatric: His speech is normal and behavior is normal. Judgment and thought content normal.    Review of Systems  Constitutional: Negative.   Psychiatric/Behavioral: Negative for hallucinations, substance abuse and suicidal ideas. The patient is not nervous/anxious.   All other systems reviewed and are negative.   Blood pressure 107/90, pulse 85, temperature 98.3 F (36.8 C), temperature source Oral, resp. rate 18, SpO2 97 %.There is no height or weight on file to calculate BMI.  General Appearance: Casual  Eye Contact:  Good  Speech:  Clear and Coherent  Volume:  Normal  Mood:  Euthymic  Affect:  Congruent  Thought Process:  Coherent and Descriptions of Associations: Intact  Orientation:  Full (Time,  Place, and Person)  Thought Content:  WDL  Suicidal Thoughts:  No  Homicidal Thoughts:  No  Memory:  Immediate;   Good Recent;   Good Remote;   Good  Judgement:  Good  Insight:  Fair  Psychomotor Activity:  Normal  Concentration:  Concentration: Good  Recall:  Good  Fund of Knowledge:  Fair  Language:  Good  Akathisia:  NA  Handed:  Right  AIMS (if indicated):   N/A  Assets:  Communication Skills Desire for Improvement  ADL's:  Intact  Cognition:  WNL  Sleep:   N/A     Demographic Factors:  Male, Caucasian, Low socioeconomic status and Unemployed  Loss Factors: Loss of significant relationship and Legal issues  Historical Factors: Family history of mental illness or substance abuse  Risk Reduction Factors:   NA  Continued Clinical Symptoms:  Depression:   Hopelessness Alcohol/Substance Abuse/Dependencies  Cognitive Features That Contribute To Risk:  None     Disposition: Patient is being discharged home with follow-up resources.  He is able to contract for safety. He appears future oriented as he is actively involved in arranging for a ride to [facility] tomorrow morning.  Elmarie Shiley, NP 02/04/2020, 11:19 AM

## 2020-02-04 NOTE — ED Notes (Signed)
Pt provided w/ cola & [place] sandwich

## 2020-02-04 NOTE — Discharge Instructions (Signed)
Please be sure to follow-up in our behavioral health center.  Return here for concerning changes in your condition.
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
## 2020-03-13 ENCOUNTER — Other Ambulatory Visit: Payer: Self-pay

## 2020-03-13 ENCOUNTER — Telehealth (HOSPITAL_COMMUNITY): Payer: No Payment, Other | Admitting: Psychiatric/Mental Health

## 2020-10-21 ENCOUNTER — Telehealth (HOSPITAL_COMMUNITY): Payer: Self-pay | Admitting: Licensed Clinical Social Worker

## 2020-10-21 ENCOUNTER — Other Ambulatory Visit: Payer: Self-pay

## 2020-10-21 ENCOUNTER — Ambulatory Visit (HOSPITAL_COMMUNITY): Payer: Self-pay | Admitting: Licensed Clinical Social Worker

## 2020-10-21 NOTE — Telephone Encounter (Signed)
LCSW sent two links to pt phone with no response. LCSW then F/U with a phone call and VM box was not setup. Administrative staff stated they received a call 2 weeks ago stating that pt was in jail, however the person that called was not listed as a contact in pt chart, so no return call was made.  

## 2020-10-21 NOTE — Progress Notes (Deleted)
LCSW sent two links to pt phone with no response. LCSW then F/U with a phone call and VM box was not setup. Administrative staff stated they received a call 2 weeks ago stating that pt was in jail, however the person that called was not listed as a contact in pt chart, so no return call was made.

## 2020-10-22 ENCOUNTER — Telehealth (HOSPITAL_COMMUNITY): Payer: Self-pay | Admitting: Psychiatry

## 2021-06-08 ENCOUNTER — Other Ambulatory Visit (HOSPITAL_COMMUNITY)
Admission: EM | Admit: 2021-06-08 | Discharge: 2021-06-12 | Disposition: A | Discharge status: Rehabilitation Facility | Payer: No Payment, Other | Attending: Psychiatry | Admitting: Psychiatry

## 2021-06-08 ENCOUNTER — Other Ambulatory Visit: Payer: Self-pay

## 2021-06-08 ENCOUNTER — Encounter (HOSPITAL_COMMUNITY): Payer: Self-pay

## 2021-06-08 ENCOUNTER — Emergency Department (HOSPITAL_COMMUNITY)
Admission: EM | Admit: 2021-06-08 | Discharge: 2021-06-08 | Disposition: A | Discharge status: Other Acute Inpatient Hospital | Payer: Self-pay | Attending: Student | Admitting: Student

## 2021-06-08 ENCOUNTER — Emergency Department (HOSPITAL_COMMUNITY): Payer: Self-pay

## 2021-06-08 DIAGNOSIS — R000 Tachycardia, unspecified: Secondary | ICD-10-CM | POA: Insufficient documentation

## 2021-06-08 DIAGNOSIS — Y900 Blood alcohol level of less than 20 mg/100 ml: Secondary | ICD-10-CM | POA: Insufficient documentation

## 2021-06-08 DIAGNOSIS — F1520 Other stimulant dependence, uncomplicated: Secondary | ICD-10-CM | POA: Insufficient documentation

## 2021-06-08 DIAGNOSIS — Z20822 Contact with and (suspected) exposure to covid-19: Secondary | ICD-10-CM | POA: Insufficient documentation

## 2021-06-08 DIAGNOSIS — F17210 Nicotine dependence, cigarettes, uncomplicated: Secondary | ICD-10-CM | POA: Insufficient documentation

## 2021-06-08 DIAGNOSIS — I10 Essential (primary) hypertension: Secondary | ICD-10-CM | POA: Insufficient documentation

## 2021-06-08 DIAGNOSIS — F332 Major depressive disorder, recurrent severe without psychotic features: Secondary | ICD-10-CM | POA: Insufficient documentation

## 2021-06-08 DIAGNOSIS — D72829 Elevated white blood cell count, unspecified: Secondary | ICD-10-CM | POA: Insufficient documentation

## 2021-06-08 DIAGNOSIS — R079 Chest pain, unspecified: Secondary | ICD-10-CM | POA: Insufficient documentation

## 2021-06-08 DIAGNOSIS — F322 Major depressive disorder, single episode, severe without psychotic features: Secondary | ICD-10-CM | POA: Diagnosis present

## 2021-06-08 DIAGNOSIS — F1910 Other psychoactive substance abuse, uncomplicated: Secondary | ICD-10-CM | POA: Diagnosis not present

## 2021-06-08 DIAGNOSIS — R45851 Suicidal ideations: Secondary | ICD-10-CM | POA: Insufficient documentation

## 2021-06-08 LAB — COMPREHENSIVE METABOLIC PANEL
ALT: 23 U/L (ref 0–44)
AST: 42 U/L — ABNORMAL HIGH (ref 15–41)
Albumin: 4.3 g/dL (ref 3.5–5.0)
Alkaline Phosphatase: 46 U/L (ref 38–126)
Anion gap: 8 (ref 5–15)
BUN: 18 mg/dL (ref 6–20)
CO2: 25 mmol/L (ref 22–32)
Calcium: 9 mg/dL (ref 8.9–10.3)
Chloride: 103 mmol/L (ref 98–111)
Creatinine, Ser: 0.88 mg/dL (ref 0.61–1.24)
GFR, Estimated: >60 mL/min (ref >60)
Glucose, Bld: 117 mg/dL — ABNORMAL HIGH (ref 70–99)
Potassium: 3.9 mmol/L (ref 3.5–5.1)
Sodium: 136 mmol/L (ref 135–145)
Total Bilirubin: 0.9 mg/dL (ref 0.3–1.2)
Total Protein: 7.3 g/dL (ref 6.5–8.1)

## 2021-06-08 LAB — SALICYLATE LEVEL: Salicylate Lvl: <7 mg/dL — ABNORMAL LOW (ref 7.0–30.0)

## 2021-06-08 LAB — CBC WITH DIFFERENTIAL/PLATELET
Abs Immature Granulocytes: 0.04 10*3/uL (ref 0.00–0.07)
Basophils Absolute: 0.1 10*3/uL (ref 0.0–0.1)
Basophils Relative: 1 %
Eosinophils Absolute: 0.1 10*3/uL (ref 0.0–0.5)
Eosinophils Relative: 1 %
HCT: 42.7 % (ref 39.0–52.0)
Hemoglobin: 14.2 g/dL (ref 13.0–17.0)
Immature Granulocytes: 0 %
Lymphocytes Relative: 16 %
Lymphs Abs: 1.9 10*3/uL (ref 0.7–4.0)
MCH: 29.3 pg (ref 26.0–34.0)
MCHC: 33.3 g/dL (ref 30.0–36.0)
MCV: 88 fL (ref 80.0–100.0)
Monocytes Absolute: 0.8 10*3/uL (ref 0.1–1.0)
Monocytes Relative: 7 %
Neutro Abs: 9.1 10*3/uL — ABNORMAL HIGH (ref 1.7–7.7)
Neutrophils Relative %: 75 %
Platelets: 294 10*3/uL (ref 150–400)
RBC: 4.85 MIL/uL (ref 4.22–5.81)
RDW: 12.7 % (ref 11.5–15.5)
WBC: 12 10*3/uL — ABNORMAL HIGH (ref 4.0–10.5)
nRBC: 0 % (ref 0.0–0.2)

## 2021-06-08 LAB — ACETAMINOPHEN LEVEL: Acetaminophen (Tylenol), Serum: <10 ug/mL — ABNORMAL LOW (ref 10–30)

## 2021-06-08 LAB — ETHANOL: Alcohol, Ethyl (B): <10 mg/dL (ref <10)

## 2021-06-08 LAB — RAPID URINE DRUG SCREEN, HOSP PERFORMED
Amphetamines: POSITIVE — AB
Barbiturates: NOT DETECTED
Benzodiazepines: POSITIVE — AB
Cocaine: NOT DETECTED
Opiates: NOT DETECTED
Tetrahydrocannabinol: NOT DETECTED

## 2021-06-08 LAB — RESP PANEL BY RT-PCR (FLU A&B, COVID) ARPGX2
Influenza A by PCR: NEGATIVE
Influenza B by PCR: NEGATIVE
SARS Coronavirus 2 by RT PCR: NEGATIVE

## 2021-06-08 LAB — TROPONIN I (HIGH SENSITIVITY): Troponin I (High Sensitivity): 11 ng/L (ref <18)

## 2021-06-08 MED ORDER — ACETAMINOPHEN 325 MG PO TABS: 650.0000 mg | ORAL_TABLET | ORAL | Status: DC | PRN

## 2021-06-08 NOTE — BH Assessment (Signed)
[facility] Assessment Progress Note   Per Fransisca Kaufmann, NP, this pt would benefit from admission to Facility Based Crisis at this time.  Earlene Plater, MD has agreed to accept pt..  Pt has signed Voluntary Admission and Consent for Treatment, as well as Consent to Release Information to his parole officer, and signed forms have been faxed to [facility].  EDP Chaney Malling, MD and pt's nurses, Lindie Spruce and Brentford, have been notified, and they  agree to send original paperwork along with pt via [employer], and to call report to [phone].  Doylene Canning, [place] [facility]al [facility] Coordinator [phone]

## 2021-06-08 NOTE — ED Notes (Signed)
Pt in the bed sleeping. Respirations are even and unlabored. No acute distress noted. Will continue to monitor for safety.

## 2021-06-08 NOTE — ED Triage Notes (Signed)
"  Been out of prison for 3 days and got back on drugs, meth, xanax, weed, anything I can get a hold of. It is making me think of taking an overdose of pills and killing myself to rid the problem" per pt
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
## 2021-06-08 NOTE — ED Provider Notes (Signed)
  Physical Exam  BP 126/80   Pulse 84   Temp 98.7 F (37.1 C) (Oral)   Resp 12   Ht 6' (1.829 m)   Wt 88.5 kg   SpO2 100%   BMI 26.45 kg/m   Physical Exam  ED Course/Procedures     Procedures  MDM  Patient accepted at [facility] under Dr. Bronwen Betters. Stable for transfer. Patient is voluntary    Charlynne Pander, MD 06/08/21 [phone]

## 2021-06-08 NOTE — ED Provider Notes (Signed)
[facility]-EMERGENCY DEPT Provider Note   CSN: [id number] Arrival date & time: 06/08/21  1030     History Chief Complaint  Patient presents with   Suicidal    Derrick [name] is a 35 y.o. male.  Derrick [name] is a 36 y.o. male with a history hypertension, bipolar disorder, anxiety and substance abuse, who presents reporting suicidal ideations.  Patient reports he got out of prison 3 days ago and since then has been back on drugs.  He reports he has been using meth, Xanax and bleed.  He reports he is really been taking anything he can get his hands on.  Reports that he has had worsening depression and increased suicidal thoughts and now reports that he wants to overdose on pills to kill himself and get rid of the problem.  He reports he does have enough pills to overdose on at home but came in to get help.  Denies any homicidal ideations.  Does report he intermittently has some auditory and visual hallucinations.  Last used methamphetamine about 2 hours prior to arrival and reports since then he has been having some intermittent chest pain and feels like his heart is racing.  Reports some shortness of breath intermittently with chest pain.  No abdominal pain, nausea or vomiting.  The history is provided by the patient.      Past Medical History:  Diagnosis Date   Anxiety    Bipolar 1 disorder (HCC)    Headache(784.0)    Hypertension    Substance abuse (HCC)     Patient Active Problem List   Diagnosis Date Noted   Chronic hepatitis C without hepatic coma (HCC) 10/01/2019   Underinsured 09/28/2019   Cocaine dependence with cocaine-induced mood disorder (HCC) 03/18/2019   ADHD (attention deficit hyperactivity disorder), combined type 01/18/2015   Major depressive disorder, recurrent, severe without psychotic behavior (HCC) 01/17/2015   Polysubstance abuse (HCC) 05/02/2013   MDD (major depressive disorder) 05/02/2013   Suicide attempt (HCC) 05/02/2013    Impulse control disorder, unspecified 01/04/2013   Unspecified episodic mood disorder 01/04/2013    History reviewed. No pertinent surgical history.     Family History  Problem Relation Age of Onset   Cancer Mother        Lung    Social History   Tobacco Use   Smoking status: Every Day    Packs/day: 1.00    Types: Cigarettes   Smokeless tobacco: Never  Substance Use Topics   Alcohol use: Not Currently   Drug use: Yes    Types: Marijuana, "Crack" cocaine, Other-see comments, Methamphetamines    Comment: [facility] - 94 days clean    Home Medications Prior to Admission medications   Medication Sig Start Date End Date Taking? Authorizing Provider  FLUoxetine (PROZAC) 20 MG capsule Take 1 capsule (20 mg total) by mouth daily. Patient not taking: Reported on 02/03/2020 01/07/20   Rankin, Shuvon B, NP  gabapentin (NEURONTIN) 300 MG capsule Take 1 capsule (300 mg total) by mouth 3 (three) times daily. Patient not taking: Reported on 02/03/2020 01/07/20   Rankin, Shuvon B, NP  cloNIDine HCl (KAPVAY) 0.1 MG TB12 ER tablet Take 1 tablet (0.1 mg total) by mouth at bedtime. Patient not taking: Reported on 03/17/2019 01/23/15 03/29/19  Adonis Brook, NP    Allergies    Penicillins  Review of Systems   Review of Systems  Constitutional:  Negative for chills and fever.  HENT: Negative.  Respiratory:  Positive for shortness of breath. Negative for cough.   Cardiovascular:  Positive for chest pain. Negative for palpitations.  Gastrointestinal:  Negative for abdominal pain, diarrhea, nausea and vomiting.  Genitourinary:  Negative for dysuria.  Musculoskeletal:  Negative for arthralgias and myalgias.  Skin:  Negative for color change and rash.  Neurological:  Negative for dizziness, syncope and light-headedness.  Psychiatric/Behavioral:  Positive for dysphoric mood and suicidal ideas.    Physical Exam Updated Vital Signs BP 133/84   Pulse 96   Temp 98.7 F (37.1 C)  (Oral)   Resp 15   Ht 6' (1.829 m)   Wt 88.5 kg   SpO2 99%   BMI 26.45 kg/m   Physical Exam Vitals and nursing note reviewed.  Constitutional:      General: He is not in acute distress.    Appearance: Normal appearance. He is well-developed. He is not ill-appearing or diaphoretic.  HENT:     Head: Normocephalic and atraumatic.     Mouth/Throat:     Mouth: Mucous membranes are moist.     Pharynx: Oropharynx is clear.  Eyes:     General:        Right eye: No discharge.        Left eye: No discharge.     Pupils: Pupils are equal, round, and reactive to light.  Cardiovascular:     Rate and Rhythm: Regular rhythm. Tachycardia present.     Pulses: Normal pulses.     Heart sounds: Normal heart sounds.     Comments: Mildly tachycardic.  Regular rhythm Pulmonary:     Effort: Pulmonary effort is normal. No respiratory distress.     Breath sounds: Normal breath sounds. No wheezing or rales.     Comments: Respirations equal and unlabored, patient able to speak in full sentences, lungs clear to auscultation bilaterally  Abdominal:     General: Bowel sounds are normal. There is no distension.     Palpations: Abdomen is soft. There is no mass.     Tenderness: There is no abdominal tenderness. There is no guarding.     Comments: Abdomen soft, nondistended, nontender to palpation in all quadrants without guarding or peritoneal signs  Musculoskeletal:        General: No deformity.     Cervical back: Neck supple.  Skin:    General: Skin is warm and dry.     Capillary Refill: Capillary refill takes less than 2 seconds.  Neurological:     Mental Status: He is alert and oriented to person, place, and time.     Coordination: Coordination normal.     Comments: Speech is clear, able to follow commands Moves extremities without ataxia, coordination intact  Psychiatric:        Mood and Affect: Mood is depressed. Affect is flat.        Speech: Speech normal.        Behavior: Behavior normal.  Behavior is cooperative.        Thought Content: Thought content includes suicidal ideation. Thought content does not include homicidal ideation. Thought content includes suicidal plan. Thought content does not include homicidal plan.    ED Results / Procedures / Treatments   Labs (all labs ordered are listed, but only abnormal results are displayed) Labs Reviewed  COMPREHENSIVE METABOLIC PANEL - Abnormal; Notable for the following components:      Result Value   Glucose, Bld 117 (*)    AST 42 (*)    All  other components within normal limits  RAPID URINE DRUG SCREEN, HOSP PERFORMED - Abnormal; Notable for the following components:   Benzodiazepines POSITIVE (*)    Amphetamines POSITIVE (*)    All other components within normal limits  CBC WITH DIFFERENTIAL/PLATELET - Abnormal; Notable for the following components:   WBC 12.0 (*)    Neutro Abs 9.1 (*)    All other components within normal limits  SALICYLATE LEVEL - Abnormal; Notable for the following components:   Salicylate Lvl <7.0 (*)    All other components within normal limits  ACETAMINOPHEN LEVEL - Abnormal; Notable for the following components:   Acetaminophen (Tylenol), Serum <10 (*)    All other components within normal limits  RESP PANEL BY RT-PCR (FLU A&B, COVID) ARPGX2  ETHANOL  TROPONIN I (HIGH SENSITIVITY)    EKG None  Radiology DG Chest Portable 1 View  Result Date: 06/08/2021 CLINICAL DATA:  Chest pain. EXAM: PORTABLE CHEST 1 VIEW COMPARISON:  03/16/2019 FINDINGS: Stable linear densities at the left lung base are most compatible with scarring. Otherwise, the lungs are clear. Heart and mediastinum are within normal limits. Negative for a pneumothorax. Trachea is midline. IMPRESSION: No active disease. Electronically Signed   By: Richarda Overlie M.D.   On: 06/08/2021 12:03    Procedures Procedures   Medications Ordered in ED Medications - No data to display  ED Course  I have reviewed the triage vital signs and  the nursing notes.  Pertinent labs & imaging results that were available during my care of the patient were reviewed by me and considered in my medical decision making (see chart for details).    MDM Rules/Calculators/A&P                           35 year old male presents reporting suicidal ideations with plan to overdose, since getting out of prison 3 days ago he has been using several different drugs.  Does report some chest pain today after using meth, was initially mildly tachycardic but this resolved without intervention.  Will get EKG, chest x-ray and troponin.  Patient without other focal medical complaints.  Patient is here voluntarily and has been cooperative.  Will need psychiatric evaluation.  Medical screening labs ordered.  I have independently ordered, reviewed and interpreted all labs and imaging: Mild leukocytosis, normal hemoglobin CMP: Glucose 117, no other significant electrolyte derangements, normal renal function, minimal elevation in AST but otherwise LFTs unremarkable UDS: Positive for benzos and amphetamines, negative acetaminophen and salicylate level as well as ethanol level Troponin is within normal limits  EKG: Sinus rhythm with early repol  Chest x-ray: No active cardiopulmonary disease  At this time patient is medically cleared pending TTS evaluation.  Placed under ED psych hold.  Disposition pending TTS recommendations.  The patient has been placed in psychiatric observation due to the need to provide a safe environment for the patient while obtaining psychiatric consultation and evaluation, as well as ongoing medical and medication management to treat the patient's condition.  The patient has not been placed under full IVC at this time.    Final Clinical Impression(s) / ED Diagnoses Final diagnoses:  Suicidal ideation    Rx / DC Orders ED Discharge Orders     None        Dartha Lodge, Cordelia Poche 06/08/21 1514    Melene Plan, DO 06/08/21  1521

## 2021-06-08 NOTE — ED Notes (Signed)
Patient ambulated onto unit without distress or complaint.  He is calm and pleasant and able to make needs known.  He is soft spoken with constricted affect.  He is dysphoric with sad mood.  He reports positive suicidal ideas to overdose on pills.  He agrees to seek out staff if he feels overwhelmed by thoughts or feelings and he was educated about the coping skill paperwork in his packet.  He was oriented to the unit and shown to his room.  Will continue to monitor and provide safe supportive environment .

## 2021-06-08 NOTE — ED Notes (Signed)
2 bags of patient belongings were placed behind the Triage nurses station.

## 2021-06-08 NOTE — ED Notes (Addendum)
Patient denies SI,HI,AVH.  Respiratory is even and unlabored. No distress noted. Patient is relaxing in bed at present. Will continue to monitor for safety.

## 2021-06-09 ENCOUNTER — Encounter (HOSPITAL_COMMUNITY): Payer: Self-pay | Admitting: Behavioral Health

## 2021-06-09 DIAGNOSIS — F1910 Other psychoactive substance abuse, uncomplicated: Secondary | ICD-10-CM | POA: Diagnosis not present

## 2021-06-09 DIAGNOSIS — F332 Major depressive disorder, recurrent severe without psychotic features: Secondary | ICD-10-CM | POA: Diagnosis not present

## 2021-06-09 DIAGNOSIS — F17210 Nicotine dependence, cigarettes, uncomplicated: Secondary | ICD-10-CM | POA: Diagnosis not present

## 2021-06-09 DIAGNOSIS — F322 Major depressive disorder, single episode, severe without psychotic features: Secondary | ICD-10-CM | POA: Diagnosis present

## 2021-06-09 MED ORDER — DICYCLOMINE HCL 10 MG PO CAPS: 10.0000 mg | ORAL_CAPSULE | Freq: Four times a day (QID) | ORAL | Status: DC | PRN

## 2021-06-09 MED ORDER — TRAZODONE HCL 50 MG PO TABS
50.0000 mg | ORAL_TABLET | Freq: Every evening | ORAL | Status: DC | PRN
  Administered 2021-06-10: 50 mg via ORAL
  Filled 2021-06-09: qty 1

## 2021-06-09 MED ORDER — ALUM & MAG HYDROXIDE-SIMETH 200-200-20 MG/5ML PO SUSP: 30.0000 mL | ORAL | Status: DC | PRN

## 2021-06-09 MED ORDER — METHOCARBAMOL 750 MG PO TABS: 750.0000 mg | ORAL_TABLET | Freq: Four times a day (QID) | ORAL | Status: DC | PRN

## 2021-06-09 MED ORDER — ONDANSETRON 4 MG PO TBDP
4.0000 mg | ORAL_TABLET | Freq: Three times a day (TID) | ORAL | Status: DC | PRN
  Administered 2021-06-11: 4 mg via ORAL
  Filled 2021-06-09: qty 1

## 2021-06-09 MED ORDER — CLONIDINE HCL 0.1 MG PO TABS: 0.1000 mg | ORAL_TABLET | ORAL | Status: DC | PRN

## 2021-06-09 MED ORDER — HYDROXYZINE HCL 25 MG PO TABS
25.0000 mg | ORAL_TABLET | Freq: Three times a day (TID) | ORAL | Status: DC | PRN
  Administered 2021-06-09 – 2021-06-11 (×3): 25 mg via ORAL
  Filled 2021-06-09: qty 1
  Filled 2021-06-09: qty 20
  Filled 2021-06-09 (×2): qty 1

## 2021-06-09 MED ORDER — LOPERAMIDE HCL 2 MG PO CAPS: 2.0000 mg | ORAL_CAPSULE | Freq: Four times a day (QID) | ORAL | Status: DC | PRN

## 2021-06-09 MED ORDER — BUPROPION HCL ER (XL) 150 MG PO TB24
150.0000 mg | ORAL_TABLET | Freq: Once | ORAL | Status: AC
  Administered 2021-06-09: 150 mg via ORAL
  Filled 2021-06-09: qty 1

## 2021-06-09 MED ORDER — MAGNESIUM HYDROXIDE 400 MG/5ML PO SUSP: 30.0000 mL | Freq: Every day | ORAL | Status: DC | PRN

## 2021-06-09 MED ORDER — ACETAMINOPHEN 325 MG PO TABS
650.0000 mg | ORAL_TABLET | Freq: Four times a day (QID) | ORAL | Status: DC | PRN
  Administered 2021-06-09 (×2): 650 mg via ORAL
  Filled 2021-06-09 (×2): qty 2

## 2021-06-09 NOTE — ED Provider Notes (Addendum)
I was informed by nursing staff that patient's parole officer needed to be contacted otherwise a warrant would be issued for his arrest. Patient provided verbal consent for me to contact parole officer. I attempted to contact officer Archie at number below. She did not answer and VM was full. I Sports administrator at email address below to inform of hospitalization   [name], [phone]. Email address is [email or web address].

## 2021-06-09 NOTE — ED Notes (Signed)
Pt eating lunch in no acute distress. Safety maintained. 

## 2021-06-09 NOTE — ED Notes (Signed)
Patient A&Ox4. Denies intent to harm self/others when asked. Denies A/VH. Patient denies any physical complaints when asked. No acute distress noted. Routine safety checks conducted according to facility protocol. Encouraged patient to notify staff if thoughts of harm toward self or others arise. Patient verbalize understanding and agreement. Will continue to monitor for safety.    

## 2021-06-09 NOTE — ED Notes (Signed)
Pt sleeping in no acute distress. RR even and unlabored. Safety maintained. 

## 2021-06-09 NOTE — Clinical Social Work Psych Note (Signed)
CSW Initial Note   CSW met with Derrick [name] for introduction and to begin discussions regarding treatment and potential discharge planning.   Upon approach, Derrick [name] appeared to be irritable and provided curt responses to questions asked, however was overall cooperative throughout the process.   Derrick [name] reports that he was recently kicked out of his previous living arrangements, however did not disclose the reason. Derrick [name] reports he is currently unemployed, homeless and have no additional supports he can identify at this time.   Derrick [name] endorsed using methamphetamines and Xanax daily. He did not disclose any specific amounts to this Probation officer. Derrick [name] requested to be referred to a residential rehabilitation program at discharge.   CSW explained the referral process and informed Derrick [name], once a bed was identified, CSW would relay the update.   [name] expressed understanding.   CSW will continue to follow for potential placement and any additional needs identified.     Radonna Ricker, MSW, LCSW Clinical Education officer, museum ([place]) [facility]

## 2021-06-09 NOTE — Group Note (Signed)
Group Topic: Wellness  Group Date: 06/09/2021 Start Time: 1015 End Time: 1100 Facilitators: Alexandria Lodge D, NT  Department: [facility]  Number of Participants: 3  Group Focus: anger management, communication, coping skills, daily focus, safety plan, and self-awareness Treatment Modality:  Behavior Modification Therapy and Patient-Centered Therapy Interventions utilized were clarification, group exercise, story telling, and support Purpose: enhance coping skills, express irrational fears, increase insight, and reinforce self-care  Name: Derrick [name] Date of Birth: 1986-04-02  MR: [id number]    Level of Participation: minimal Quality of Participation: quiet Interactions with others: gave feedback Mood/Affect: appropriate Triggers (if applicable): figuring out life. Cognition: coherent/clear Progress: Minimal Response:  Plan: patient will be encouraged to set small goals and journal.   Patients Problems:  Patient Active Problem List   Diagnosis Date Noted   MDD (major depressive disorder), severe (HCC) 06/09/2021   Chronic hepatitis C without hepatic coma (HCC) 10/01/2019   Underinsured 09/28/2019   Cocaine dependence with cocaine-induced mood disorder (HCC) 03/18/2019   ADHD (attention deficit hyperactivity disorder), combined type 01/18/2015   Major depressive disorder, recurrent, severe without psychotic behavior (HCC) 01/17/2015   Polysubstance abuse (HCC) 05/02/2013   MDD (major depressive disorder) 05/02/2013   Suicide attempt (HCC) 05/02/2013   Impulse control disorder, unspecified 01/04/2013   Unspecified episodic mood disorder 01/04/2013

## 2021-06-09 NOTE — ED Notes (Signed)
Pt is in bed sleeping. Respirations are even and unlabored. No acute distress noted. Will continue to monitor for safety. 

## 2021-06-09 NOTE — ED Notes (Signed)
Received call from pt's friend, Gearldine Bienenstock, stating that pt's probation officer wants documentation that pt is admitted to [facility] by 6pm or she will have to issue a warrant for arrest. Probation officer name is Linnell Fulling, [id number]. Email address is [email or web address]. Information given to provider (Dr. Bronwen Betters) in a secure chat.

## 2021-06-09 NOTE — ED Notes (Signed)
Patient A&Ox4. Denies intent to harm self/others when asked. Denies A/VH. Patient denies any physical complaints when asked. No acute distress noted. At present patient rates his mood (0-10).     A: Support and encouragement provided. Routine safety checks conducted according to facility protocol. Encouraged patient to notify staff if thoughts of harm toward self or others arise. Patient verbalize understanding and agreement.   R: Patient remains safe and patient verbally contracts for safety at this time. Will continue to monitor.
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
## 2021-06-09 NOTE — ED Notes (Signed)
Pt engaged and participated in AA/NA group.  

## 2021-06-09 NOTE — ED Notes (Signed)
Pt walking around unit. A&O x4, calm and cooperative. Pt denies current SI/HI/AVH. Pt reports headache 8/10. No signs of acute distress noted. Will continue to monitor for safety.

## 2021-06-10 DIAGNOSIS — F332 Major depressive disorder, recurrent severe without psychotic features: Secondary | ICD-10-CM | POA: Diagnosis not present

## 2021-06-10 DIAGNOSIS — F1910 Other psychoactive substance abuse, uncomplicated: Secondary | ICD-10-CM | POA: Diagnosis not present

## 2021-06-10 DIAGNOSIS — F17210 Nicotine dependence, cigarettes, uncomplicated: Secondary | ICD-10-CM | POA: Diagnosis not present

## 2021-06-10 MED ORDER — BUPROPION HCL ER (XL) 150 MG PO TB24
150.0000 mg | ORAL_TABLET | Freq: Every day | ORAL | Status: DC
  Administered 2021-06-10 – 2021-06-11 (×2): 150 mg via ORAL
  Filled 2021-06-10 (×2): qty 1
  Filled 2021-06-10: qty 14

## 2021-06-10 MED ORDER — BUPROPION HCL ER (XL) 150 MG PO TB24: 150.0000 mg | ORAL_TABLET | Freq: Every day | ORAL | 1 refills | Status: DC

## 2021-06-10 MED ORDER — HYDROXYZINE HCL 25 MG PO TABS: 25.0000 mg | ORAL_TABLET | Freq: Three times a day (TID) | ORAL | 1 refills | Status: DC | PRN

## 2021-06-10 NOTE — ED Notes (Signed)
PT attended wrap up group 

## 2021-06-10 NOTE — Group Note (Signed)
Group Topic: Wellness  Group Date: 06/10/2021 Start Time: 1000 End Time: 1100 Facilitators: Levander Campion  Department: [facility]  Number of Participants: 5  Group Focus: personal responsibility Treatment Modality:  Behavior Modification Therapy Interventions utilized were assignment, patient education, and problem solving Purpose: enhance coping skills and reinforce self-care  Name: Derrick [name] Date of Birth: 10-08-1985  MR: [id number]    Level of Participation: active Quality of Participation: attentive and cooperative Interactions with others: gave feedback Mood/Affect: appropriate Triggers (if applicable): n/a Cognition: coherent/clear Progress: Minimal Response: n/a Plan: follow-up needed  Patients Problems:  Patient Active Problem List   Diagnosis Date Noted   MDD (major depressive disorder), severe (HCC) 06/09/2021   Chronic hepatitis C without hepatic coma (HCC) 10/01/2019   Underinsured 09/28/2019   Cocaine dependence with cocaine-induced mood disorder (HCC) 03/18/2019   ADHD (attention deficit hyperactivity disorder), combined type 01/18/2015   Major depressive disorder, recurrent, severe without psychotic behavior (HCC) 01/17/2015   Polysubstance abuse (HCC) 05/02/2013   MDD (major depressive disorder) 05/02/2013   Suicide attempt (HCC) 05/02/2013   Impulse control disorder, unspecified 01/04/2013   Unspecified episodic mood disorder 01/04/2013

## 2021-06-10 NOTE — ED Provider Notes (Signed)
Behavioral Health Progress Note  Date and Time: 06/10/2021 11:37 AM Name: Derrick [name] MRN:  [id number]  Subjective:   Patient seen and chart reviewed.  Patient has been medication compliant, has been attending groups, and has been appropriate with peers and staff while on the unit.  Patient interviewed in his room today.  He is found lying in bed in no acute distress.  Patient describes his mood as "I am not sure".  He rates it a 5 out of 10 (10 being the best).  Patient continues to report passive SI.  He denies HI/AVH.  Patient reports sleeping well.  And describes his anxiety as "moderate".  Discussed with patient that he has as needed hydroxyzine available for anxiety, informed him that he will need to ask for it.  Patient verbalized understanding.  Patient denies headache, chest pain, abdominal pain, vomiting,.  Patient denies current nausea although states he did feel nauseous this morning, however it resolved on its own.  Informed patient yesterday that after his receiving consent to contact his parole officer , the phone call was not answered, however his parole officer was sent email indicating that he was currently in the hospital.  Patient verbalized understanding.  Patient states that he has not had a chance to speak with his parole officer yet.  Patient continues to report interest in seeking substance use treatment.  Diagnosis:  Final diagnoses:  Major depressive disorder, recurrent, severe without psychotic behavior (HCC)  Polysubstance abuse (HCC)    Total Time spent with patient: 15 minutes  Past Psychiatric History: see H&P Past Medical History:  Past Medical History:  Diagnosis Date   Anxiety    Bipolar 1 disorder (HCC)    Headache(784.0)    Hypertension    Substance abuse (HCC)    History reviewed. No pertinent surgical history. Family History:  Family History  Problem Relation Age of Onset   Cancer Mother        Lung   Family Psychiatric  History: see  H&P Social History:  Social History   Substance and Sexual Activity  Alcohol Use Not Currently     Social History   Substance and Sexual Activity  Drug Use Yes   Types: Marijuana, "Crack" cocaine, Other-see comments, Methamphetamines   Comment: [facility] - 94 days clean    Social History   Socioeconomic History   Marital status: Single    Spouse name: Not on file   Number of children: Not on file   Years of education: Not on file   Highest education level: Not on file  Occupational History   Not on file  Tobacco Use   Smoking status: Every Day    Packs/day: 1.00    Types: Cigarettes   Smokeless tobacco: Never  Substance and Sexual Activity   Alcohol use: Not Currently   Drug use: Yes    Types: Marijuana, "Crack" cocaine, Other-see comments, Methamphetamines    Comment: [facility] - 94 days clean   Sexual activity: Not on file  Other Topics Concern   Not on file  Social History Narrative   Not on file   Social Determinants of Health   Financial Resource Strain: Not on file  Food Insecurity: Not on file  Transportation Needs: Not on file  Physical Activity: Not on file  Stress: Not on file  Social Connections: Not on file   SDOH:  SDOH Screenings   Alcohol Screen: Not on file  Depression (CXK4-8): Not on file  Financial Resource Strain: Not on file  Food Insecurity: Not on file  Housing: Not on file  Physical Activity: Not on file  Social Connections: Not on file  Stress: Not on file  Tobacco Use: High Risk   Smoking Tobacco Use: Every Day   Smokeless Tobacco Use: Never  Transportation Needs: Not on file   Additional Social History:                         Sleep: Good  Appetite:  Good  Current Medications:  Current Facility-Administered Medications  Medication Dose Route Frequency Provider Last Rate Last Admin   acetaminophen (TYLENOL) tablet 650 mg  650 mg Oral Q6H PRN Estella Husk, MD   650 mg at  06/09/21 2117   alum & mag hydroxide-simeth (MAALOX/MYLANTA) 200-200-20 MG/5ML suspension 30 mL  30 mL Oral Q4H PRN Estella Husk, MD       buPROPion (WELLBUTRIN XL) 24 hr tablet 150 mg  150 mg Oral Daily Estella Husk, MD   150 mg at 06/10/21 1104   cloNIDine (CATAPRES) tablet 0.1 mg  0.1 mg Oral Q4H PRN Estella Husk, MD       dicyclomine (BENTYL) capsule 10 mg  10 mg Oral Q6H PRN Estella Husk, MD       hydrOXYzine (ATARAX/VISTARIL) tablet 25 mg  25 mg Oral TID PRN Estella Husk, MD   25 mg at 06/09/21 2117   loperamide (IMODIUM) capsule 2 mg  2 mg Oral Q6H PRN Estella Husk, MD       magnesium hydroxide (MILK OF MAGNESIA) suspension 30 mL  30 mL Oral Daily PRN Estella Husk, MD       methocarbamol (ROBAXIN) tablet 750 mg  750 mg Oral Q6H PRN Estella Husk, MD       ondansetron (ZOFRAN-ODT) disintegrating tablet 4 mg  4 mg Oral Q8H PRN Estella Husk, MD       traZODone (DESYREL) tablet 50 mg  50 mg Oral QHS PRN Estella Husk, MD       Current Outpatient Medications  Medication Sig Dispense Refill   ALPRAZolam (XANAX) 1 MG tablet Take 1 mg by mouth daily as needed for anxiety.     doxepin (SINEQUAN) 25 MG capsule Take 25 mg by mouth daily.     FLUoxetine (PROZAC) 20 MG capsule Take 1 capsule (20 mg total) by mouth daily. (Patient not taking: No sig reported) 30 capsule 0   gabapentin (NEURONTIN) 300 MG capsule Take 1 capsule (300 mg total) by mouth 3 (three) times daily. (Patient not taking: No sig reported) 60 capsule 0    Labs  Lab Results:  Admission on 06/08/2021, Discharged on 06/08/2021  Component Date Value Ref Range Status   SARS Coronavirus 2 by RT PCR 06/08/2021 NEGATIVE  NEGATIVE Final   Comment: (NOTE) SARS-CoV-2 target nucleic acids are NOT DETECTED.  The SARS-CoV-2 RNA is generally detectable in upper respiratory specimens during the acute phase of infection. The lowest concentration of SARS-CoV-2  viral copies this assay can detect is 138 copies/mL. A negative result does not preclude SARS-Cov-2 infection and should not be used as the sole basis for treatment or other patient management decisions. A negative result may occur with  improper specimen collection/handling, submission of specimen other than nasopharyngeal swab, presence of viral mutation(s) within the areas targeted by this assay, and inadequate number of viral copies(<138 copies/mL). A negative result must be  combined with clinical observations, patient history, and epidemiological information. The expected result is Negative.  Fact Sheet for Patients:  BloggerCourse.com  Fact Sheet for Healthcare Providers:  SeriousBroker.it  This test is no                          t yet approved or cleared by the [place] FDA and  has been authorized for detection and/or diagnosis of SARS-CoV-2 by FDA under an Emergency Use Authorization (EUA). This EUA will remain  in effect (meaning this test can be used) for the duration of the COVID-19 declaration under Section 564(b)(1) of the Act, 21 U.S.C.section 360bbb-3(b)(1), unless the authorization is terminated  or revoked sooner.       Influenza A by PCR 06/08/2021 NEGATIVE  NEGATIVE Final   Influenza B by PCR 06/08/2021 NEGATIVE  NEGATIVE Final   Comment: (NOTE) The Xpert Xpress SARS-CoV-2/FLU/RSV plus assay is intended as an aid in the diagnosis of influenza from Nasopharyngeal swab specimens and should not be used as a sole basis for treatment. Nasal washings and aspirates are unacceptable for Xpert Xpress SARS-CoV-2/FLU/RSV testing.  Fact Sheet for Patients: BloggerCourse.com  Fact Sheet for Healthcare Providers: SeriousBroker.it  This test is not yet approved or cleared by the [place] FDA and has been authorized for detection and/or diagnosis of SARS-CoV-2  by FDA under an Emergency Use Authorization (EUA). This EUA will remain in effect (meaning this test can be used) for the duration of the COVID-19 declaration under Section 564(b)(1) of the Act, 21 U.S.C. section 360bbb-3(b)(1), unless the authorization is terminated or revoked.  Performed at [facility], [address] [place], [place] [address]    Sodium 06/08/2021 136  135 - 145 mmol/L Final   Potassium 06/08/2021 3.9  3.5 - 5.1 mmol/L Final   Chloride 06/08/2021 103  98 - 111 mmol/L Final   CO2 06/08/2021 25  22 - 32 mmol/L Final   Glucose, Bld 06/08/2021 117 (A) 70 - 99 mg/dL Final   Glucose reference range applies only to samples taken after fasting for at least 8 hours.   BUN 06/08/2021 18  6 - 20 mg/dL Final   Creatinine, Ser 06/08/2021 0.88  0.61 - 1.24 mg/dL Final   Calcium 38/46/6599 9.0  8.9 - 10.3 mg/dL Final   Total Protein 35/70/1779 7.3  6.5 - 8.1 g/dL Final   Albumin 39/10/90 4.3  3.5 - 5.0 g/dL Final   AST 33/00/7622 42 (A) 15 - 41 U/L Final   ALT 06/08/2021 23  0 - 44 U/L Final   Alkaline Phosphatase 06/08/2021 46  38 - 126 U/L Final   Total Bilirubin 06/08/2021 0.9  0.3 - 1.2 mg/dL Final   GFR, Estimated 06/08/2021 >60  >60 mL/min Final   Comment: (NOTE) Calculated using the CKD-EPI Creatinine Equation (2021)    Anion gap 06/08/2021 8  5 - 15 Final   Performed at [facility], [address], [place], [place] [address]   Alcohol, Ethyl (B) 06/08/2021 <10  <10 mg/dL Final   Comment: (NOTE) Lowest detectable limit for serum alcohol is 10 mg/dL.  For medical purposes only. Performed at [facility], [address], [place], [place] [address]    Opiates 06/08/2021 NONE DETECTED  NONE DETECTED Final   Cocaine 06/08/2021 NONE DETECTED  NONE DETECTED Final   Benzodiazepines 06/08/2021 POSITIVE (A) NONE DETECTED Final   Amphetamines 06/08/2021 POSITIVE (A) NONE DETECTED Final  Tetrahydrocannabinol  06/08/2021 NONE DETECTED  NONE DETECTED Final   Barbiturates 06/08/2021 NONE DETECTED  NONE DETECTED Final   Comment: (NOTE) DRUG SCREEN FOR MEDICAL PURPOSES ONLY.  IF CONFIRMATION IS NEEDED FOR ANY PURPOSE, NOTIFY LAB WITHIN 5 DAYS.  LOWEST DETECTABLE LIMITS FOR URINE DRUG SCREEN Drug Class                     Cutoff (ng/mL) Amphetamine and metabolites    1000 Barbiturate and metabolites    200 Benzodiazepine                 200 Tricyclics and metabolites     300 Opiates and metabolites        300 Cocaine and metabolites        300 THC                            50 Performed at [facility], [address], [place], [place] [address]    WBC 06/08/2021 12.0 (A) 4.0 - 10.5 K/uL Final   RBC 06/08/2021 4.85  4.22 - 5.81 MIL/uL Final   Hemoglobin 06/08/2021 14.2  13.0 - 17.0 g/dL Final   HCT 69/62/9528 42.7  39.0 - 52.0 % Final   MCV 06/08/2021 88.0  80.0 - 100.0 fL Final   MCH 06/08/2021 29.3  26.0 - 34.0 pg Final   MCHC 06/08/2021 33.3  30.0 - 36.0 g/dL Final   RDW 41/32/4401 12.7  11.5 - 15.5 % Final   Platelets 06/08/2021 294  150 - 400 K/uL Final   nRBC 06/08/2021 0.0  0.0 - 0.2 % Final   Neutrophils Relative % 06/08/2021 75  % Final   Neutro Abs 06/08/2021 9.1 (A) 1.7 - 7.7 K/uL Final   Lymphocytes Relative 06/08/2021 16  % Final   Lymphs Abs 06/08/2021 1.9  0.7 - 4.0 K/uL Final   Monocytes Relative 06/08/2021 7  % Final   Monocytes Absolute 06/08/2021 0.8  0.1 - 1.0 K/uL Final   Eosinophils Relative 06/08/2021 1  % Final   Eosinophils Absolute 06/08/2021 0.1  0.0 - 0.5 K/uL Final   Basophils Relative 06/08/2021 1  % Final   Basophils Absolute 06/08/2021 0.1  0.0 - 0.1 K/uL Final   Immature Granulocytes 06/08/2021 0  % Final   Abs Immature Granulocytes 06/08/2021 0.04  0.00 - 0.07 K/uL Final   Performed at [facility], [address], [place], [place] [address]   Salicylate Lvl 06/08/2021 <7.0 (A) 7.0 - 30.0 mg/dL Final   Performed  at [facility], [address], [place], [place] [address]   Acetaminophen (Tylenol), Serum 06/08/2021 <10 (A) 10 - 30 ug/mL Final   Comment: (NOTE) Therapeutic concentrations vary significantly. A range of 10-30 ug/mL  may be an effective concentration for many patients. However, some  are best treated at concentrations outside of this range. Acetaminophen concentrations >150 ug/mL at 4 hours after ingestion  and >50 ug/mL at 12 hours after ingestion are often associated with  toxic reactions.  Performed at [facility], [address] [place], [place] [address]    Troponin I (High Sensitivity) 06/08/2021 11  <18 ng/L Final   Comment: (NOTE) Elevated high sensitivity troponin I (hsTnI) values and significant  changes across serial measurements may suggest ACS but many other  chronic and acute conditions are known to elevate hsTnI results.  Refer to the "Links" section  for chest pain algorithms and additional  guidance. Performed at [facility], [address], [place], [place] [address]     Blood Alcohol level:  Lab Results  Component Value Date   ETH <10 06/08/2021   ETH <10 02/03/2020    Metabolic Disorder Labs: Lab Results  Component Value Date   HGBA1C 5.7 05/15/2015   No results found for: PROLACTIN Lab Results  Component Value Date   CHOL 222 (A) 05/15/2015   TRIG 295 (A) 05/15/2015   HDL 39 05/15/2015   LDLCALC 124 05/15/2015    Therapeutic Lab Levels: No results found for: LITHIUM No results found for: VALPROATE No components found for:  CBMZ  Physical Findings   AIMS    Flowsheet Row Admission (Discharged) from 03/17/2019 in [facility] Ad[facility] (Discharged) [facility]  AIMS Total Score 0 0      AUDIT    Flowsheet Row Admission (Discharged) from 03/17/2019 in [facility] Admission  (Discharged) [facility] Admission (Discharged) from 05[facility]02/2013 [facility] Admission (Discharged) from 01/03/2013 [facility]  Alcohol Use Disorder Identification Test Final Score (AUDIT) 12 28 34 5      Flowsheet Row ED from 06/08/2021 in [facility] Most recent reading at 06/08/2021  6:34 PM ED from 06/08/2021 in [facility] Most recent reading at 06/08/2021 10:49 AM ED from 02/03/2020 in [facility] EMERGENCY DEPARTMENT Most recent reading at 02/03/2020  6:44 PM  C-SSRS RISK CATEGORY High Risk High Risk High Risk        Musculoskeletal  Strength & Muscle Tone: within normal limits Gait & Station: normal Patient leans: N/A  Psychiatric Specialty Exam  Presentation  General Appearance: Appropriate for Environment; Casual  Eye Contact:Fair  Speech:Clear and Coherent  Speech Volume:Normal  Handedness: No data recorded  Mood and Affect  Mood:Euthymic  Affect:Appropriate; Congruent; Constricted   Thought Process  Thought Processes:Coherent; Goal Directed; Linear  Descriptions of Associations:Intact  Orientation:Full (Time, Place and Person)  Thought Content:WDL; Logical  Diagnosis of Schizophrenia or Schizoaffective disorder in past: Yes    Hallucinations:Hallucinations: None  Ideas of Reference:None  Suicidal Thoughts:Suicidal Thoughts: Yes, Passive  Homicidal Thoughts:Homicidal Thoughts: No   Sensorium  Memory: Immediate Good; Recent Good; Remote Good Judgment:Fair  Insight:Fair   Executive Functions  Concentration:Fair  Attention Span:Fair  Recall:Good  Fund of Knowledge:Good  Language:Good   Psychomotor Activity  Psychomotor Activity:Psychomotor Activity: Normal   Assets  Assets:Communication Skills; Desire for Improvement;  Resilience   Sleep  Sleep:Sleep: Good   Nutritional Assessment (For OBS and [facility] admissions only) Has the patient had a decrease in food intake/or appetite?: No Does the patient have dental problems?: No Does the patient have eating habits or behaviors that may be indicators of an eating disorder including binging or inducing vomiting?: No Has the patient recently lost weight without trying?: 0 Has the patient been eating poorly because of a decreased appetite?: 0 Malnutrition Screening Tool Score: 0   Physical Exam  Physical Exam Constitutional:      Appearance: Normal appearance. He is normal weight.  HENT:     Head: Normocephalic and atraumatic.  Eyes:     Extraocular Movements: Extraocular movements intact.  Pulmonary:     Effort: Pulmonary effort is normal.  Neurological:     General: No focal  deficit present.     Mental Status: He is alert and oriented to person, place, and time.  Psychiatric:        Attention and Perception: Attention and perception normal.        Speech: Speech normal.        Behavior: Behavior normal. Behavior is cooperative.        Thought Content: Thought content normal.   Review of Systems  Constitutional:  Negative for chills and fever.  HENT:  Negative for hearing loss.   Eyes:  Negative for discharge and redness.  Respiratory:  Negative for cough.   Cardiovascular:  Negative for chest pain.  Gastrointestinal:  Negative for abdominal pain.  Musculoskeletal:  Negative for myalgias.  Neurological:  Negative for headaches.  Psychiatric/Behavioral:  Positive for depression, substance abuse and suicidal ideas. Negative for hallucinations.        Passive SI.  Blood pressure 114/76, pulse (!) 58, temperature (!) 97.5 F (36.4 C), temperature source Temporal, resp. rate 18, SpO2 100 %. There is no height or weight on file to calculate BMI.  Treatment Plan Summary:  35 yo male with a history of hypertension, anxiety, MDD, substance use and self  reported bipolar disorder who presented to the [facility] ED On 10/17 with SI. Patient reported to TTS that he was released from prison 3 days ago and had been using meth , xanax, and using unprescribed suboxone while incarcerated. He reported that he had  been experiencing increased depression and was suicidal with a plan to OD on pills. He admitted to using meth 2 hours prior to arrival. UDS+methamphetamine, benzodiazepines, etoh negative. Patient was accepted to [facility] for further treatment on 10/17.    On interview today patient reports depressed mood, passive SI, depressive symptoms of anhedonia, hopelessness, difficulty concentrating, anhedonia and meets criteria for MDD. Wellbutrin XL 150  mg for mood was initiated on 10/18-he has been tolerating well-will continue at this time. a PRNs for opioid withdrawal sx will remain available as he reported abusing suboxone and described opioid withdrawal sx upon arrival although current denies.  Patient remains approrpaite for [facility] at this time.      MDD -continue wellbutrin XL 150 mg (started 06/09/21).    Polysubstance abuse -prior to presenting to the ED reported that he took xanax, meth. Also reporting abusing suboxone will in jail-he is reported some opioids withdrawal sx -PRNs ordered as below -pt expresses interest in rehab. Opiate Withdrawal Protocol: -Clonidine 0.1mg  PO q4hr PRN for withdrawal associated HTN -Bentyl 10mg  PO q6hr PRN for abdominal muscle cramps -Loperamide 2mg  PO q6hr PRN for diarrhea -Robaxin 750mg  PO q8hr PRN for muscle spasm -Zofran 4mg  SL q8hr PRN for nausea -Vistaril 25mg  PO q8hr PRN for anxiety   Dispo: ongoing. Likely residential rehab per request of patient. SW following for assistance.  , MD 06/10/2021 11:37 AM

## 2021-06-10 NOTE — ED Notes (Signed)
Snacks given 

## 2021-06-10 NOTE — Progress Notes (Addendum)
Pt is currently asleep. Respirations are even and unlabored. Pt did not voice any complaints of pain or discomfort. Pt endorses passive SI. Denies intent. Pt verbally contracts for safety on unit. No distress noted. Pt denies current HI/AVH. Staff will monitor for pt's safety.

## 2021-06-10 NOTE — Progress Notes (Signed)
Pt is currently resting quietly. No signs of acute distress noted. Monitoring for pt's safety.

## 2021-06-10 NOTE — ED Notes (Signed)
Pt asleep in bed. Respirations even and unlabored. Will continue to monitor for safety. ?

## 2021-06-10 NOTE — Clinical Social Work Psych Note (Signed)
CSW Update    Camren reports he feels "alright" today. He denied having any SI, HI or AVH at this time.    Esiah was accepted to [facility] for residential substance abuse rehabilitation services on Friday, 06/12/2021.    Keanan will be transported to [facility] via [employer].     Tysheem must arrive at [facility] no later than 9:00am.   If patient arrives late, he will not be screened for admission. Please ensure Safe Transport is arranged in a timely manner.   Valerio will require a 14 day supply of medications, in addition to a 30-day prescription with 1 refill.        Aqil is agreeable with his current discharge plan. .     CSW will continue to follow until discharge.    [name], MSW, LCSW Clinical Child psychotherapist (Facility Based Crisis) [facility]

## 2021-06-10 NOTE — ED Notes (Signed)
PT is in dining room eating a snack and attending wrap up group.

## 2021-06-10 NOTE — Clinical Social Work Psych Note (Signed)
Cognitive Distortions & Restructuring  Cognitive Distortions & Cognitive Restructuring (CBT & DBT Skills)  Date: 06/10/21  Type of Therapy/Therapeutic Modalities:  Participation Level: Active  Objective: The purpose of this group is to discuss and assist patients in identifying cognitive distortions (negative thinking patterns) that can influence their emotions and behaviors. Facilitators will guide conversations that discuss how these cognitive distortions contribute to common disorders such as anxiety and depression.   Therapeutic Goals:  Patient will identify negative thinking patterns they currently experience and how those thoughts influence their behaviors.  Patient will begin to explore the possible misinformation and/or traumatic experiences that influence their negative thought patterns.  Patient will explore the foundations and techniques of cognitive restructuring by reviewing CBT and DBT techniques.  Patient will discuss the   Summary of Patient's Progress:  Derrick [name] was engaged and participated throughout the group session. Derrick [name] identified personal struggles with specific cognitive distortions. Derrick [name] reviewed the CBT model and how cognitive distortions have an impact on our behaviors. Derrick [name] completed worksheet challenging patients on the objective and therapeutic goals listed above.

## 2021-06-11 DIAGNOSIS — F17210 Nicotine dependence, cigarettes, uncomplicated: Secondary | ICD-10-CM | POA: Diagnosis not present

## 2021-06-11 DIAGNOSIS — F332 Major depressive disorder, recurrent severe without psychotic features: Secondary | ICD-10-CM | POA: Diagnosis not present

## 2021-06-11 DIAGNOSIS — F1910 Other psychoactive substance abuse, uncomplicated: Secondary | ICD-10-CM | POA: Diagnosis not present

## 2021-06-11 NOTE — ED Notes (Signed)
PT is in bed sleeping, no distress noted, will continue to monitor patient for safety 

## 2021-06-11 NOTE — Clinical Social Work Psych Note (Signed)
CSW Update   [name] reports he is feeling "much better" today. He denied having any SI, HI or AVH at this time.    [name] was accepted to [facility] for residential substance abuse rehabilitation services on Friday, 06/12/2021. [name] will be transported to [facility] via [employer].[name] must arrive at [facility] no later than 9:00am.    If patient arrives late, he will not be screened for admission. Please ensure Safe Transport is arranged in a timely manner. [name] will require a 14 day supply of medications, in addition to a 30-day prescription with 1 refill.   [name] denied having any questions, however he was able to speak with his parole officer,   Linnell Fulling, [phone]. According to [name], he has to sign a "consent" form prior to him going to [facility] for his parole office.   CSW reached out to [name] via email ([email or web address]) and requested the required documentation be sent to [facility] via e-mail or fax.   CSW will continue to follow.    [name], MSW, LCSW Clinical Child psychotherapist (Facility Based Crisis) [facility]

## 2021-06-11 NOTE — Progress Notes (Signed)
Patient was given the number for his parole officer and he was observed speaking on the phone at length.  He has been calm and in good behavioral control.  At this point in time he appears motivated for further treatment.  Will monitor and provide safe environment.

## 2021-06-11 NOTE — Progress Notes (Signed)
Patient complained of nausea without vomitting and was given zofran PRN.  Awaiting result.

## 2021-06-11 NOTE — Progress Notes (Signed)
Patient calm and pleasant on approach.  He is aware of plan for him to go to [facility] tomorrow morning to residential treatment.  He is recovery focused and has called his parole officer who is aware of plan.  He has also called his father to arrange pick up of his clothing at his former girlfriends house.He denies avh shi or plan and will seek out staff if overwhelmed by thoughts or feelings.

## 2021-06-11 NOTE — ED Notes (Signed)
Pt lying in bed quietly eyes closed , respirations unlabored , will continue to monitor

## 2021-06-11 NOTE — ED Notes (Signed)
Patient remains resting in bed this morning without distress or complaint.  Will continue to monitor and provide safe supportive environment.

## 2021-06-11 NOTE — Group Note (Signed)
Group Topic: Relapse and Recovery  Group Date: 06/11/2021 Start Time: 1010 End Time: 1100 Facilitators: Doyne Keel E  Department: [facility]  Number of Participants: 6  Group Focus: goals/reality orientation, personal responsibility, and problem solving Treatment Modality:  Solution-Focused Therapy Interventions utilized were patient education and problem solving Purpose: enhance coping skills, express feelings, relapse prevention strategies, and trigger / craving management  Name: Derrick [name] Date of Birth: Aug 31, 1985  MR: [id number]    Level of Participation: active Quality of Participation: attentive and cooperative Interactions with others: gave feedback Mood/Affect: appropriate Triggers (if applicable): n/a Cognition: coherent/clear Progress: Moderate Response: n/a Plan: follow-up needed  Patients Problems:  Patient Active Problem List   Diagnosis Date Noted   MDD (major depressive disorder), severe (HCC) 06/09/2021   Chronic hepatitis C without hepatic coma (HCC) 10/01/2019   Underinsured 09/28/2019   Cocaine dependence with cocaine-induced mood disorder (HCC) 03/18/2019   ADHD (attention deficit hyperactivity disorder), combined type 01/18/2015   Major depressive disorder, recurrent, severe without psychotic behavior (HCC) 01/17/2015   Polysubstance abuse (HCC) 05/02/2013   MDD (major depressive disorder) 05/02/2013   Suicide attempt (HCC) 05/02/2013   Impulse control disorder, unspecified 01/04/2013   Unspecified episodic mood disorder 01/04/2013
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
## 2021-06-11 NOTE — Discharge Instructions (Addendum)

## 2021-06-11 NOTE — ED Notes (Signed)
Alert and oriented x4 , in dayroom at onset of shift interacting with peers , pleasant and cooperative , voicing no complaints , denies SI / HI
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
## 2021-06-12 DIAGNOSIS — F17210 Nicotine dependence, cigarettes, uncomplicated: Secondary | ICD-10-CM | POA: Diagnosis not present

## 2021-06-12 DIAGNOSIS — F332 Major depressive disorder, recurrent severe without psychotic features: Secondary | ICD-10-CM | POA: Diagnosis not present

## 2021-06-12 DIAGNOSIS — F1910 Other psychoactive substance abuse, uncomplicated: Secondary | ICD-10-CM | POA: Diagnosis not present

## 2021-06-12 NOTE — Progress Notes (Signed)
AVS, RX reviewed with patient and all questions answered.  AVS, RX and medication samples sent with patient to [facility].  Patient ambulated independently without issue to [facility] port.  All belongings returned and belongings sheet signed.  Patient discharged in stable condition with safe transport to [facility] in [facility].

## 2021-06-12 NOTE — ED Notes (Signed)
Appears to be resting quietly , eyes closed , respirations even and unlabored , no distress noted , will continue to monitor

## 2021-06-12 NOTE — Progress Notes (Signed)
Patient in dining room having breakfast.  Safe Transport called to request driver to take patient to [facility] in [place].

## 2021-06-22 ENCOUNTER — Other Ambulatory Visit: Payer: Self-pay

## 2021-06-22 ENCOUNTER — Ambulatory Visit: Payer: Self-pay | Attending: Physician Assistant | Admitting: Physician Assistant

## 2021-06-22 ENCOUNTER — Ambulatory Visit: Payer: Self-pay | Admitting: Physician Assistant

## 2021-06-22 VITALS — BP 132/83 | HR 64 | Temp 98.9°F | Resp 18 | Ht 72.0 in | Wt 208.0 lb

## 2021-06-22 DIAGNOSIS — F902 Attention-deficit hyperactivity disorder, combined type: Secondary | ICD-10-CM

## 2021-06-22 DIAGNOSIS — F322 Major depressive disorder, single episode, severe without psychotic features: Secondary | ICD-10-CM

## 2021-06-22 DIAGNOSIS — K5909 Other constipation: Secondary | ICD-10-CM

## 2021-06-22 DIAGNOSIS — Z6828 Body mass index (BMI) 28.0-28.9, adult: Secondary | ICD-10-CM

## 2021-06-22 DIAGNOSIS — F4310 Post-traumatic stress disorder, unspecified: Secondary | ICD-10-CM

## 2021-06-22 DIAGNOSIS — F1910 Other psychoactive substance abuse, uncomplicated: Secondary | ICD-10-CM

## 2021-06-22 DIAGNOSIS — F411 Generalized anxiety disorder: Secondary | ICD-10-CM

## 2021-06-22 DIAGNOSIS — B182 Chronic viral hepatitis C: Secondary | ICD-10-CM

## 2021-06-22 MED ORDER — HYDROXYZINE HCL 25 MG PO TABS: ORAL_TABLET | ORAL | 1 refills | Status: AC

## 2021-06-22 MED ORDER — POLYETHYLENE GLYCOL 3350 17 GM/SCOOP PO POWD: ORAL | 1 refills | Status: AC

## 2021-06-22 MED ORDER — BUPROPION HCL ER (XL) 150 MG PO TB24: 150.0000 mg | ORAL_TABLET | Freq: Every day | ORAL | 1 refills | Status: AC

## 2021-06-22 NOTE — Progress Notes (Signed)
New Patient Office Visit  Subjective:  Patient ID: Derrick [name], male    DOB: Sep 15, 1985  Age: 35 y.o. MRN: [id number]  CC: No chief complaint on file.   HPI Derrick [name] reports that he is currently being treated for substance abuse at [facility] residential treatment center.  Reports that he has been there since June 12, 2021 and does plan on transitioning to caring services in [facility] in a few more weeks.  Reports that he has noticed an improvement in his anxiety since starting Wellbutrin on June 10, 2021.  Reports that he does use hydroxyzine at bedtime, but has noticed that he has not needed it during the day as often from when he first started it.  Does state that he has been having some difficulty falling asleep and staying asleep.  States that he is using 1 tablet of hydroxyzine without much relief.  Reports that he has suffered from constipation for most of his life.  Reports that he can go up to a week without a bowel movement.  Reports that he was incarcerated recently for 1 year, states that during incarceration he had a regimen of Senokot and Colace that did not offer much relief.  Does endorse that he works on staying well-hydrated.  Reports history of successful treatment for hepatitis C with [facility] approximately 2 years ago    Past Medical History:  Diagnosis Date   Anxiety    Bipolar 1 disorder (HCC)    Headache(784.0)    Hypertension    Substance abuse (HCC)     No past surgical history on file.  Family History  Problem Relation Age of Onset   Cancer Mother        Lung    Social History   Socioeconomic History   Marital status: Single    Spouse name: Not on file   Number of children: Not on file   Years of education: Not on file   Highest education level: Not on file  Occupational History   Not on file  Tobacco Use   Smoking status: Every Day    Packs/day: 1.00    Types: Cigarettes   Smokeless tobacco: Never  Substance and Sexual  Activity   Alcohol use: Not Currently   Drug use: Yes    Types: Marijuana, "Crack" cocaine, Other-see comments, Methamphetamines    Comment: [facility] - 94 days clean   Sexual activity: Not on file  Other Topics Concern   Not on file  Social History Narrative   Not on file   Social Determinants of Health   Financial Resource Strain: Not on file  Food Insecurity: Not on file  Transportation Needs: Not on file  Physical Activity: Not on file  Stress: Not on file  Social Connections: Not on file  Intimate Partner Violence: Not on file    ROS Review of Systems  Constitutional:  Negative for chills and fever.  HENT: Negative.    Eyes: Negative.   Respiratory:  Negative for shortness of breath.   Cardiovascular:  Negative for chest pain.  Gastrointestinal:  Positive for constipation. Negative for anal bleeding, blood in stool, diarrhea, nausea and vomiting.  Endocrine: Negative.   Genitourinary: Negative.   Musculoskeletal: Negative.   Skin: Negative.   Allergic/Immunologic: Negative.   Neurological: Negative.   Hematological: Negative.   Psychiatric/Behavioral:  Positive for sleep disturbance. Negative for dysphoric mood, self-injury and suicidal ideas. The patient is not nervous/anxious.    Objective:  Today's Vitals: BP 132/83 (BP Location: Left Arm, Patient Position: Sitting, Cuff Size: Normal)   Pulse 64   Temp 98.9 F (37.2 C) (Oral)   Resp 18   Ht 6' (1.829 m)   Wt 208 lb (94.3 kg)   SpO2 100%   BMI 28.21 kg/m   Physical Exam Vitals and nursing note reviewed.  Constitutional:      Appearance: Normal appearance.  HENT:     Head: Normocephalic and atraumatic.     Right Ear: External ear normal.     Left Ear: External ear normal.     Nose: Nose normal.     Mouth/Throat:     Mouth: Mucous membranes are moist.     Pharynx: Oropharynx is clear.  Eyes:     Extraocular Movements: Extraocular movements intact.     Conjunctiva/sclera: Conjunctivae  normal.     Pupils: Pupils are equal, round, and reactive to light.  Cardiovascular:     Rate and Rhythm: Normal rate and regular rhythm.     Pulses: Normal pulses.     Heart sounds: Normal heart sounds.  Pulmonary:     Effort: Pulmonary effort is normal.     Breath sounds: Normal breath sounds.  Musculoskeletal:        General: Normal range of motion.     Cervical back: Normal range of motion and neck supple.  Skin:    General: Skin is warm and dry.  Neurological:     General: No focal deficit present.     Mental Status: He is oriented to person, place, and time.  Psychiatric:        Mood and Affect: Mood normal.        Behavior: Behavior normal.        Thought Content: Thought content normal.        Judgment: Judgment normal.    Assessment & Plan:   Problem List Items Addressed This Visit       Digestive   Chronic hepatitis C without hepatic coma (HCC)     Other   Polysubstance abuse (HCC) (Chronic)   ADHD (attention deficit hyperactivity disorder), combined type   MDD (major depressive disorder), severe (HCC) - Primary   Relevant Medications   buPROPion (WELLBUTRIN XL) 150 MG 24 hr tablet   hydrOXYzine (ATARAX/VISTARIL) 25 MG tablet   Other Visit Diagnoses     PTSD (post-traumatic stress disorder)       Relevant Medications   buPROPion (WELLBUTRIN XL) 150 MG 24 hr tablet   hydrOXYzine (ATARAX/VISTARIL) 25 MG tablet   GAD (generalized anxiety disorder)       Relevant Medications   buPROPion (WELLBUTRIN XL) 150 MG 24 hr tablet   hydrOXYzine (ATARAX/VISTARIL) 25 MG tablet   Other constipation       Relevant Medications   polyethylene glycol powder (GLYCOLAX/MIRALAX) 17 GM/SCOOP powder       Outpatient Encounter Medications as of 06/22/2021  Medication Sig   polyethylene glycol powder (GLYCOLAX/MIRALAX) 17 GM/SCOOP powder Take 1/2 - 1 full capful once daily as needed   buPROPion (WELLBUTRIN XL) 150 MG 24 hr tablet Take 1 tablet (150 mg total) by mouth daily.    hydrOXYzine (ATARAX/VISTARIL) 25 MG tablet Take 1-2 tabs PO q8hrs PRN   [DISCONTINUED] buPROPion (WELLBUTRIN XL) 150 MG 24 hr tablet Take 1 tablet (150 mg total) by mouth daily.   [DISCONTINUED] cloNIDine HCl (KAPVAY) 0.1 MG TB12 ER tablet Take 1 tablet (0.1 mg total) by mouth at bedtime. (Patient not  taking: Reported on 03/17/2019)   [DISCONTINUED] hydrOXYzine (ATARAX/VISTARIL) 25 MG tablet Take 1 tablet (25 mg total) by mouth 3 (three) times daily as needed for anxiety.   No facility-administered encounter medications on file as of 06/22/2021.   1. MDD (major depressive disorder), severe (HCC) Continue Wellbutrin.  Increase hydroxyzine, 1 to 2 tablets as needed every 8 hours.  Patient given application for  financial assistance.  Patient to follow-up with this provider in 2 to 4 weeks.   - buPROPion (WELLBUTRIN XL) 150 MG 24 hr tablet; Take 1 tablet (150 mg total) by mouth daily.  Dispense: 30 tablet; Refill: 1 - hydrOXYzine (ATARAX/VISTARIL) 25 MG tablet; Take 1-2 tabs PO q8hrs PRN  Dispense: 60 tablet; Refill: 1  2. PTSD (post-traumatic stress disorder)   3. GAD (generalized anxiety disorder)   4. ADHD (attention deficit hyperactivity disorder), combined type   5. Polysubstance abuse (HCC) Currently in substance abuse treatment program  6. Other constipation Patient education given on supportive care, trial MiraLAX - polyethylene glycol powder (GLYCOLAX/MIRALAX) 17 GM/SCOOP powder; Take 1/2 - 1 full capful once daily as needed  Dispense: 850 g; Refill: 1  7. Chronic hepatitis C without hepatic coma (HCC) Successfully treated in April 2021  8. BMI 28.0-28.9,adult   I have reviewed the patient's medical history (PMH, PSH, Social History, Family History, Medications, and allergies) , and have been updated if relevant. I spent 30 minutes reviewing chart and  face to face time with patient.    Follow-up: Return if symptoms worsen or fail to improve.   Kasandra Knudsen  [place], PA-C

## 2021-06-22 NOTE — Progress Notes (Signed)
Patient presents for medication refill. Patient has taken medication and eaten today. Patient denies pain at this time.

## 2021-06-22 NOTE — Patient Instructions (Signed)
To help with your anxiety and insomnia, we are going to increase your hydroxyzine, you can use 1 to 2 tablets every 8 hours as needed.  To help with your constipation, you will start using MiraLAX, one half capful to 1 full capful once daily as needed.  Please let us know if there is anything else we can do for you.  Roney Jaffe, PA-C Physician Assistant [facility] [email or web address]   Constipation, Adult Constipation is when a person has fewer than three bowel movements in a week, has difficulty having a bowel movement, or has stools (feces) that are dry, hard, or larger than normal. Constipation may be caused by an underlying condition. It may become worse with age if a person takes certain medicines and does not take in enough fluids. Follow these instructions at home: Eating and drinking  Eat foods that have a lot of fiber, such as beans, whole grains, and fresh fruits and vegetables. Limit foods that are low in fiber and high in fat and processed sugars, such as fried or sweet foods. These include french fries, hamburgers, cookies, candies, and soda. Drink enough fluid to keep your urine pale yellow. General instructions Exercise regularly or as told by your health care provider. Try to do 150 minutes of moderate exercise each week. Use the bathroom when you have the urge to go. Do not hold it in. Take over-the-counter and prescription medicines only as told by your health care provider. This includes any fiber supplements. During bowel movements: Practice deep breathing while relaxing the lower abdomen. Practice pelvic floor relaxation. Watch your condition for any changes. Let your health care provider know about them. Keep all follow-up visits as told by your health care provider. This is important. Contact a health care provider if: You have pain that gets worse. You have a fever. You do not have a bowel movement  after 4 days. You vomit. You are not hungry or you lose weight. You are bleeding from the opening between the buttocks (anus). You have thin, pencil-like stools. Get help right away if: You have a fever and your symptoms suddenly get worse. You leak stool or have blood in your stool. Your abdomen is bloated. You have severe pain in your abdomen. You feel dizzy or you faint. Summary Constipation is when a person has fewer than three bowel movements in a week, has difficulty having a bowel movement, or has stools (feces) that are dry, hard, or larger than normal. Eat foods that have a lot of fiber, such as beans, whole grains, and fresh fruits and vegetables. Drink enough fluid to keep your urine pale yellow. Take over-the-counter and prescription medicines only as told by your health care provider. This includes any fiber supplements. This information is not intended to replace advice given to you by your health care provider. Make sure you discuss any questions you have with your health care provider. Document Revised: 06/27/2019 Document Reviewed: 06/27/2019 Elsevier Patient Education  2022 [employer].

## 2021-06-23 ENCOUNTER — Encounter: Payer: Self-pay | Admitting: Physician Assistant

## 2021-06-23 DIAGNOSIS — F411 Generalized anxiety disorder: Secondary | ICD-10-CM | POA: Insufficient documentation

## 2021-06-23 DIAGNOSIS — K5909 Other constipation: Secondary | ICD-10-CM | POA: Insufficient documentation

## 2021-06-23 DIAGNOSIS — Z6828 Body mass index (BMI) 28.0-28.9, adult: Secondary | ICD-10-CM | POA: Insufficient documentation

## 2021-06-23 DIAGNOSIS — F4310 Post-traumatic stress disorder, unspecified: Secondary | ICD-10-CM | POA: Insufficient documentation
